# Patient Record
Sex: Female | Born: 1960
Health system: Southern US, Community
[De-identification: ages and names within clinical notes are randomized; demographics above are authoritative.]

## PROBLEM LIST (undated history)

## (undated) DIAGNOSIS — K8020 Calculus of gallbladder without cholecystitis without obstruction: Secondary | ICD-10-CM

## (undated) DIAGNOSIS — K429 Umbilical hernia without obstruction or gangrene: Secondary | ICD-10-CM

## (undated) DIAGNOSIS — K420 Umbilical hernia with obstruction, without gangrene: Secondary | ICD-10-CM

## (undated) DIAGNOSIS — K8010 Calculus of gallbladder with chronic cholecystitis without obstruction: Secondary | ICD-10-CM

## (undated) DIAGNOSIS — I10 Essential (primary) hypertension: Secondary | ICD-10-CM

## (undated) DIAGNOSIS — Z862 Personal history of diseases of the blood and blood-forming organs and certain disorders involving the immune mechanism: Secondary | ICD-10-CM

## (undated) DIAGNOSIS — G629 Polyneuropathy, unspecified: Secondary | ICD-10-CM

## (undated) DIAGNOSIS — E785 Hyperlipidemia, unspecified: Secondary | ICD-10-CM

## (undated) HISTORY — DX: Hyperlipidemia, unspecified: E78.5

## (undated) HISTORY — DX: Personal history of diseases of the blood and blood-forming organs and certain disorders involving the immune mechanism: Z86.2

## (undated) HISTORY — PX: OTHER SURGICAL HISTORY: SHX169

## (undated) HISTORY — DX: Polyneuropathy, unspecified: G62.9

---
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
## 2004-12-11 ENCOUNTER — Ambulatory Visit: Payer: Self-pay | Admitting: Internal Medicine

## 2004-12-13 ENCOUNTER — Ambulatory Visit: Payer: Self-pay | Admitting: Internal Medicine

## 2005-01-28 ENCOUNTER — Ambulatory Visit: Payer: Self-pay | Admitting: Internal Medicine

## 2005-02-13 ENCOUNTER — Encounter: Admission: RE | Admit: 2005-02-13 | Discharge: 2005-04-21 | Payer: Self-pay | Admitting: Internal Medicine

## 2007-06-10 ENCOUNTER — Encounter: Payer: Self-pay | Admitting: Internal Medicine

## 2008-06-02 ENCOUNTER — Ambulatory Visit: Payer: Self-pay | Admitting: Internal Medicine

## 2008-06-02 DIAGNOSIS — IMO0002 Reserved for concepts with insufficient information to code with codable children: Secondary | ICD-10-CM

## 2008-06-05 LAB — CONVERTED CEMR LAB
ALT: 15 units/L (ref 0–35)
Alkaline Phosphatase: 64 units/L (ref 39–117)
Bilirubin, Direct: 0.1 mg/dL (ref 0.0–0.3)
CO2: 29 meq/L (ref 19–32)
Cholesterol: 214 mg/dL (ref 0–200)
Glucose, Bld: 96 mg/dL (ref 70–99)
HDL: 43.5 mg/dL (ref >39.0)
Lymphocytes Relative: 25.1 % (ref 12.0–46.0)
Monocytes Relative: 6.4 % (ref 3.0–12.0)
Neutrophils Relative %: 65.7 % (ref 43.0–77.0)
Platelets: 260 10*3/uL (ref 150–400)
Potassium: 4.1 meq/L (ref 3.5–5.1)
RDW: 12.1 % (ref 11.5–14.6)
Sodium: 139 meq/L (ref 135–145)
TSH: 1.29 microintl units/mL (ref 0.35–5.50)
Total Bilirubin: 0.8 mg/dL (ref 0.3–1.2)
Total CHOL/HDL Ratio: 4.9
Total Protein: 6.8 g/dL (ref 6.0–8.3)
Triglycerides: 79 mg/dL (ref 0–149)
VLDL: 16 mg/dL (ref 0–40)

## 2008-06-06 ENCOUNTER — Telehealth (INDEPENDENT_AMBULATORY_CARE_PROVIDER_SITE_OTHER): Payer: Self-pay | Admitting: *Deleted

## 2008-06-07 ENCOUNTER — Encounter (INDEPENDENT_AMBULATORY_CARE_PROVIDER_SITE_OTHER): Payer: Self-pay | Admitting: *Deleted

## 2008-08-08 ENCOUNTER — Ambulatory Visit: Payer: Self-pay | Admitting: Internal Medicine

## 2008-08-14 LAB — CONVERTED CEMR LAB
ALT: 25 units/L (ref 0–35)
AST: 21 units/L (ref 0–37)
Albumin: 3.6 g/dL (ref 3.5–5.2)
Alkaline Phosphatase: 58 units/L (ref 39–117)
Bilirubin, Direct: 0 mg/dL (ref 0.0–0.3)
Cholesterol: 175 mg/dL (ref 0–200)
HDL: 45.8 mg/dL (ref >39.00)
LDL Cholesterol: 113 mg/dL — ABNORMAL HIGH (ref 0–99)
Total Bilirubin: 0.5 mg/dL (ref 0.3–1.2)
Total CHOL/HDL Ratio: 4
Total Protein: 6.6 g/dL (ref 6.0–8.3)
Triglycerides: 81 mg/dL (ref 0.0–149.0)
VLDL: 16.2 mg/dL (ref 0.0–40.0)

## 2008-08-15 ENCOUNTER — Encounter (INDEPENDENT_AMBULATORY_CARE_PROVIDER_SITE_OTHER): Payer: Self-pay | Admitting: *Deleted

## 2009-01-26 ENCOUNTER — Ambulatory Visit: Payer: Self-pay | Admitting: Vascular Surgery

## 2009-01-26 ENCOUNTER — Encounter: Payer: Self-pay | Admitting: Internal Medicine

## 2010-04-22 HISTORY — PX: SHOULDER SURGERY: SHX246

## 2010-09-04 NOTE — Consult Note (Signed)
VASCULAR SURGERY CONSULTATION   Erica Daugherty, Erica Daugherty  DOB:  1961/02/19                                       01/26/2009  GNFAO#:[id number]   I saw the patient in the office today in consultation concerning her  bilateral lower extremity swelling.  This is a pleasant 50 year old  woman who was referred by Dr. Rennis Chris.  She states that she has had  swelling in both legs for years and her swelling has typically been more  significant on the left side.  She has no history of previous DVT or  phlebitis.  She has had no history of abdominal surgery or surgery in  her groins.  She has had no previous radiation therapy.  The swelling  has been persistent for years and it is increased with prolonged  standing and ambulation.  She has just recently tried compression  stockings, which Dr. Rennis Chris prescribed, and she does feel that this has  helped some.   PAST MEDICAL HISTORY:  Fairly unremarkable.  She denies any history of  diabetes, hypertension, hypercholesterolemia, history of previous  myocardial infarction, history of congestive heart failure, or history  of COPD.   FAMILY HISTORY:  There is no history of premature cardiovascular  disease.   SOCIAL HISTORY:  She is married.  She has 4 children.  She works at  [employer] and spends most of her time on her feet.  She does not use tobacco.   REVIEW OF SYSTEMS AND MEDICATIONS:  Documented on the medical history  form on her chart.   PHYSICAL EXAMINATION:  This is a pleasant 50 year old woman who appears  her stated age.  She has moderate obesity.  Blood pressure is 124/87,  heart rate is 72.  HEENT is unremarkable.  Neck is supple.  I do not  detect any carotid bruits.  Lungs are clear bilaterally to auscultation.  On cardiac exam, she has a regular rate and rhythm.  Her abdomen is  obese and difficult to assess.  I cannot palpate an aneurysm.  She has  palpable femoral, popliteal, and pedal pulses  bilaterally.  She has  bilateral lower extremity swelling, slightly more significant on the  left side.  She does have some hyperpigmentation bilaterally, but again  more significantly on the left side.  She has no ischemic ulcers.  Neurologic exam shows good strength in her upper extremities and lower  extremities bilaterally.  Her edema in the lower extremities is mostly  nonpitting.   She did have a Doppler study in our office today, which showed no  evidence of DVT on either side, and no evidence of significant  incompetence in the deep system or great saphenous vein.   Based on her exam with hyperpigmentation bilaterally, I think she does  have some chronic venous insufficiency.  However, I think she also has  significant idiopathic lymphedema.  I have explained, however, that  fortunately the treatment for these is the same.  We have discussed the  importance of intermittent leg elevation and compression therapy.  We  have discussed the proper therapy for leg evaluation. and we have also  had a discussion about being sure that she is properly fitted for  compression stockings, and how best to wear these.  I think that by  following these recommendations, that her swelling will improve,  although this will  be a chronic problem.  I have reassured her that she  has no evidence of DVT and has excellent arterial flow.  I will be happy  to see her back at any time if any new vascular issues arise.   Di Kindle. Edilia Bo, M.D.  Electronically Signed  CSD/MEDQ  D:  01/26/2009  T:  01/26/2009  Job:  [id number]   cc:   Titus Dubin. Alwyn Ren, MD,FACP,FCCP  Vania Rea. Supple, M.D.

## 2010-09-04 NOTE — Procedures (Signed)
DUPLEX DEEP VENOUS EXAM - LOWER EXTREMITY   INDICATION:  Venous insufficiency, bilateral lower extremity swelling.   HISTORY:  Edema:  Chronic bilateral lower extremity swelling.  Trauma/Surgery:  No.  Pain:  No.  PE:  No.  Previous DVT:  No.  Anticoagulants:  No.  Other:   DUPLEX EXAM:                CFV   SFV   PopV  PTV    GSV                R  L  R  L  R  L  R   L  R  L  Thrombosis    o  o  o  o  o  o  o   o  o  o  Spontaneous   +  +  +  +  +  +  +   +  +  +  Phasic        +  +  +  +  +  +  +   +  +  +  Augmentation  +  +  +  +  +  +  +   +  +  +  Compressible  +  +  +  +  +  +  +   +  +  +  Competent     +  +  +  +  +  +  +   +  +  +   Legend:  + - yes  o - no  p - partial  D - decreased   IMPRESSION:  No evidence of deep venous thrombosis noted in the  bilateral lower extremities.    _____________________________  Di Kindle. Edilia Bo, M.D.   CH/MEDQ  D:  01/26/2009  T:  01/27/2009  Job:  [id number]

## 2011-02-01 ENCOUNTER — Encounter: Payer: Self-pay | Admitting: Internal Medicine

## 2011-04-12 ENCOUNTER — Encounter: Payer: Self-pay | Admitting: Internal Medicine

## 2011-05-27 ENCOUNTER — Encounter: Payer: Self-pay | Admitting: Internal Medicine

## 2011-07-23 ENCOUNTER — Ambulatory Visit (INDEPENDENT_AMBULATORY_CARE_PROVIDER_SITE_OTHER): Payer: 59 | Admitting: Internal Medicine

## 2011-07-23 VITALS — BP 130/90 | HR 80 | Temp 98.6°F | Resp 16 | Ht 66.5 in | Wt 240.2 lb

## 2011-07-23 DIAGNOSIS — J309 Allergic rhinitis, unspecified: Secondary | ICD-10-CM

## 2011-07-23 DIAGNOSIS — R05 Cough: Secondary | ICD-10-CM

## 2011-07-23 MED ORDER — PREDNISONE 20 MG PO TABS: ORAL_TABLET | ORAL | Status: DC

## 2011-07-23 MED ORDER — BENZONATATE 200 MG PO CAPS: 200.0000 mg | ORAL_CAPSULE | Freq: Three times a day (TID) | ORAL | Status: DC | PRN

## 2011-07-23 MED ORDER — ALBUTEROL SULFATE (2.5 MG/3ML) 0.083% IN NEBU
2.5000 mg | INHALATION_SOLUTION | Freq: Once | RESPIRATORY_TRACT | Status: AC
  Administered 2011-07-23: 2.5 mg via RESPIRATORY_TRACT

## 2011-07-23 NOTE — Progress Notes (Signed)
  Subjective:    Patient ID: Erica Daugherty, female    DOB: 1960/05/07, 51 y.o.   MRN: [id number]  Cough This is a new problem. The current episode started in the past 7 days. The problem has been unchanged. The problem occurs every few minutes. The cough is non-productive. Associated symptoms include nasal congestion and rhinorrhea. Pertinent negatives include no chest pain, fever, headaches, sore throat or wheezing. The symptoms are aggravated by nothing. She has tried OTC cough suppressant for the symptoms. Her past medical history is significant for environmental allergies. There is no history of asthma or bronchitis.  Erica Daugherty is here for a persistent dry cough that is unrelenting and not allowing her to sleep.  She has two co-workers that have been sick recently.  She wonders if it is her allergies causing this, as she has had no fever, no sore throat, but a tickle in the back of her throat and in her ear canals.  She is traveling to [place] tomorrow and wants to be checked out.    Review of Systems  Constitutional: Negative for fever.  HENT: Positive for rhinorrhea. Negative for sore throat.   Respiratory: Positive for cough. Negative for wheezing.   Cardiovascular: Negative for chest pain.  Neurological: Negative for headaches.  Hematological: Positive for environmental allergies.  All other systems reviewed and are negative.       Objective:   Physical Exam  Vitals reviewed. Constitutional: She is oriented to person, place, and time. She appears well-developed and well-nourished.  HENT:  Head: Normocephalic.  Mouth/Throat: Oropharynx is clear and moist. No oropharyngeal exudate.       Cerumen present in right IAC, left IAC with cerumen, TM appears normal  Neck: Neck supple.  Cardiovascular: Normal rate, regular rhythm and normal heart sounds.   Pulmonary/Chest: Effort normal and breath sounds normal. No respiratory distress. She has no wheezes.  Lymphadenopathy:    She has no  cervical adenopathy.  Neurological: She is alert and oriented to person, place, and time.  Skin: Skin is warm and dry.  Psychiatric: She has a normal mood and affect. Her behavior is normal.          Assessment & Plan:  RAD with allergic rhinits:  Not feeling much different after an albuterol nebulizer treatment.   Prednisone taper given.  Start Claritin 10 mg daily.  She is also given some Tessalon Perles to help her over the next few days with her travel as needed.  Discussed etiologies of allergies and given AVS print out.  Return if not much improved in a few days.

## 2011-07-23 NOTE — Patient Instructions (Signed)
Take your prednisone as directed and use the Tessalon Perles if needed.  Be sure and take a Claritin everyday for at least 2 weeks, then try going off for a time, if your symptoms return right away it is likely you have allergies.  Return to the clinic if your symptoms worsen. Allergies, Generic Allergies may happen from anything your body is sensitive to. This may be food, medicines, pollens, chemicals, and nearly anything around you in everyday life that produces allergens. An allergen is anything that causes an allergy producing substance. Heredity is often a factor in causing these problems. This means you may have some of the same allergies as your parents. Food allergies happen in all age groups. Food allergies are some of the most severe and life threatening. Some common food allergies are cow's milk, seafood, eggs, nuts, wheat, and soybeans. SYMPTOMS   Swelling around the mouth.   An itchy red rash or hives.   Vomiting or diarrhea.   Difficulty breathing.  SEVERE ALLERGIC REACTIONS ARE LIFE-THREATENING. This reaction is called anaphylaxis. It can cause the mouth and throat to swell and cause difficulty with breathing and swallowing. In severe reactions only a trace amount of food (for example, peanut oil in a salad) may cause death within seconds. Seasonal allergies occur in all age groups. These are seasonal because they usually occur during the same season every year. They may be a reaction to molds, grass pollens, or tree pollens. Other causes of problems are house dust mite allergens, pet dander, and mold spores. The symptoms often consist of nasal congestion, a runny itchy nose associated with sneezing, and tearing itchy eyes. There is often an associated itching of the mouth and ears. The problems happen when you come in contact with pollens and other allergens. Allergens are the particles in the air that the body reacts to with an allergic reaction. This causes you to release allergic  antibodies. Through a chain of events, these eventually cause you to release histamine into the blood stream. Although it is meant to be protective to the body, it is this release that causes your discomfort. This is why you were given anti-histamines to feel better. If you are unable to pinpoint the offending allergen, it may be determined by skin or blood testing. Allergies cannot be cured but can be controlled with medicine. Hay fever is a collection of all or some of the seasonal allergy problems. It may often be treated with simple over-the-counter medicine such as diphenhydramine. Take medicine as directed. Do not drink alcohol or drive while taking this medicine. Check with your caregiver or package insert for child dosages. If these medicines are not effective, there are many new medicines your caregiver can prescribe. Stronger medicine such as nasal spray, eye drops, and corticosteroids may be used if the first things you try do not work well. Other treatments such as immunotherapy or desensitizing injections can be used if all else fails. Follow up with your caregiver if problems continue. These seasonal allergies are usually not life threatening. They are generally more of a nuisance that can often be handled using medicine. HOME CARE INSTRUCTIONS   If unsure what causes a reaction, keep a diary of foods eaten and symptoms that follow. Avoid foods that cause reactions.   If hives or rash are present:   Take medicine as directed.   You may use an over-the-counter antihistamine (diphenhydramine) for hives and itching as needed.   Apply cold compresses (cloths) to the skin or  take baths in cool water. Avoid hot baths or showers. Heat will make a rash and itching worse.   If you are severely allergic:   Following a treatment for a severe reaction, hospitalization is often required for closer follow-up.   Wear a medic-alert bracelet or necklace stating the allergy.   You and your family  must learn how to give adrenaline or use an anaphylaxis kit.   If you have had a severe reaction, always carry your anaphylaxis kit or EpiPen with you. Use this medicine as directed by your caregiver if a severe reaction is occurring. Failure to do so could have a fatal outcome.  SEEK MEDICAL CARE IF:  You suspect a food allergy. Symptoms generally happen within 30 minutes of eating a food.   Your symptoms have not gone away within 2 days or are getting worse.   You develop new symptoms.   You want to retest yourself or your child with a food or drink you think causes an allergic reaction. Never do this if an anaphylactic reaction to that food or drink has happened before. Only do this under the care of a caregiver.  SEEK IMMEDIATE MEDICAL CARE IF:   You have difficulty breathing, are wheezing, or have a tight feeling in your chest or throat.   You have a swollen mouth, or you have hives, swelling, or itching all over your body.   You have had a severe reaction that has responded to your anaphylaxis kit or an EpiPen. These reactions may return when the medicine has worn off. These reactions should be considered life threatening.  MAKE SURE YOU:   Understand these instructions.   Will watch your condition.   Will get help right away if you are not doing well or get worse.  Document Released: 07/02/2002 Document Revised: 03/28/2011 Document Reviewed: 12/07/2007 [facility] Patient Information 2012 [place], [place].

## 2011-09-05 ENCOUNTER — Encounter: Payer: Self-pay | Admitting: Internal Medicine

## 2011-11-11 ENCOUNTER — Encounter: Payer: Self-pay | Admitting: Internal Medicine

## 2011-11-18 ENCOUNTER — Encounter: Payer: Self-pay | Admitting: Internal Medicine

## 2012-01-21 ENCOUNTER — Encounter: Payer: Self-pay | Admitting: Internal Medicine

## 2012-03-24 ENCOUNTER — Encounter: Payer: Self-pay | Admitting: Internal Medicine

## 2012-03-24 ENCOUNTER — Ambulatory Visit (INDEPENDENT_AMBULATORY_CARE_PROVIDER_SITE_OTHER): Payer: 59 | Admitting: Internal Medicine

## 2012-03-24 VITALS — BP 120/84 | HR 68 | Temp 98.0°F | Resp 12 | Ht 67.03 in | Wt 248.0 lb

## 2012-03-24 DIAGNOSIS — Z0000 Encounter for general adult medical examination without abnormal findings: Secondary | ICD-10-CM

## 2012-03-24 DIAGNOSIS — E785 Hyperlipidemia, unspecified: Secondary | ICD-10-CM

## 2012-03-24 NOTE — Progress Notes (Signed)
Subjective:    Patient ID: Erica Daugherty, female    DOB: 03-17-1961, 51 y.o.   MRN: [id number]  HPI  Erica Daugherty is here for a physical;acute issues include hormonal imbalance.     Review of Systems She was seeing Dr Lelon Huh in [place] to address hormonal imbalance. She is not had a gynecologic followup for at least 24 months. There is a family history of  testicular cancer in her brother had prostate cancer in her father.  She is on no specific nutritional program; she's not on a regular exercise program. When active she denies chest pain, palpitations or dyspnea. She does have some ankle edema. She denies claudication but does have some muscle cramps in her legs at times.  Advanced as far testing has revealed that her LDL goal was less than 100, ideally less than 70.     Objective:   Physical Exam Gen.:  well-nourished in appearance. Alert, appropriate and cooperative throughout exam. Head: Normocephalic without obvious abnormalities  Eyes: No corneal or conjunctival inflammation noted. Pupils equal round reactive to light and accommodation. Fundal exam is benign without hemorrhages, exudate, papilledema. Extraocular motion intact. Vision grossly normal with lenses. Ears: External  ear exam reveals no significant lesions or deformities. Canals clear .TMs normal. Hearing is grossly normal bilaterally. Nose: External nasal exam reveals no deformity or inflammation. Nasal mucosa are pink and moist. No lesions or exudates noted.   Mouth: Oral mucosa and oropharynx reveal no lesions or exudates. Teeth in good repair. Neck: No deformities, masses, or tenderness noted. Range of motion & Thyroid normal. Lungs: Normal respiratory effort; chest expands symmetrically. Lungs are clear to auscultation without rales, wheezes, or increased work of breathing. Heart: Normal rate and rhythm. Normal S1 and S2. No gallop, click, or rub. S4 w/o murmur. Abdomen: Bowel sounds normal; abdomen soft and nontender. No  masses, organomegaly or hernias noted. Genitalia: to establish with Gyn   .                                                                                   Musculoskeletal/extremities: No deformity or scoliosis noted of  the thoracic or lumbar spine. No clubbing, cyanosis, edema, or deformity noted. Range of motion  normal .Tone & strength  normal.Joints normal. Nail health  good. Vascular: Carotid, radial artery, dorsalis pedis and  posterior tibial pulses are full and equal. No bruits present. Neurologic: Alert and oriented x3. Deep tendon reflexes symmetrical and normal.          Skin: Intact without suspicious lesions or rashes. Lymph: No cervical, axillary lymphadenopathy present. Psych: Mood and affect are normal. Normally interactive                                                                                         Assessment & Plan:  #  1 comprehensive physical exam; no acute findings  Plan: see Orders

## 2012-03-24 NOTE — Patient Instructions (Addendum)
Preventive Health Care: Exercise  30-45  minutes a day, 3-4 days a week. Walking is especially valuable in preventing Osteoporosis. Eat a low-fat diet with lots of fruits and vegetables, up to 7-9 servings per day.  Consume less than 30 grams of sugar per day from foods & drinks with High Fructose Corn Syrup as #1,2,3 or #4 on label. Health Care Power of Attorney & Living Will place you in charge of your health care  decisions. Verify these are  in place. Please  schedule fasting Labs : BMET,Lipids, hepatic panel, CBC & dif, TSH. PLEASE BRING THESE INSTRUCTIONS TO FOLLOW UP  LAB APPOINTMENT.This will guarantee correct labs are drawn, eliminating need for repeat blood sampling ( needle sticks ! ). Diagnoses /Codes:V70.0. If you activate My Chart; the results can be released to you as soon as they populate from the lab. If you choose not to use this program; the labs have to be reviewed, copied & mailed   causing a delay in getting the results to you.  As per the Standard of Care , screening Colonoscopy recommended @ 50 & every 5-10 years thereafter . More frequent monitor would be dictated by family history or findings @ Colonoscopy

## 2012-03-26 ENCOUNTER — Other Ambulatory Visit (INDEPENDENT_AMBULATORY_CARE_PROVIDER_SITE_OTHER): Payer: 59

## 2012-03-26 DIAGNOSIS — Z0000 Encounter for general adult medical examination without abnormal findings: Secondary | ICD-10-CM

## 2012-03-26 LAB — CBC WITH DIFFERENTIAL/PLATELET
Basophils Absolute: 0 10*3/uL (ref 0.0–0.1)
Basophils Relative: 0.4 % (ref 0.0–3.0)
Eosinophils Relative: 3.1 % (ref 0.0–5.0)
HCT: 42.9 % (ref 36.0–46.0)
Hemoglobin: 14.1 g/dL (ref 12.0–15.0)
Lymphs Abs: 2.4 10*3/uL (ref 0.7–4.0)
Monocytes Relative: 7.9 % (ref 3.0–12.0)
Neutro Abs: 6 10*3/uL (ref 1.4–7.7)
RDW: 13.2 % (ref 11.5–14.6)

## 2012-03-26 LAB — HEPATIC FUNCTION PANEL
Albumin: 3.9 g/dL (ref 3.5–5.2)
Alkaline Phosphatase: 66 U/L (ref 39–117)

## 2012-03-26 LAB — LIPID PANEL
Cholesterol: 203 mg/dL — ABNORMAL HIGH (ref 0–200)
Total CHOL/HDL Ratio: 5
VLDL: 56.6 mg/dL — ABNORMAL HIGH (ref 0.0–40.0)

## 2012-03-26 LAB — BASIC METABOLIC PANEL
CO2: 25 mEq/L (ref 19–32)
Chloride: 103 mEq/L (ref 96–112)
Potassium: 3.9 mEq/L (ref 3.5–5.1)
Sodium: 136 mEq/L (ref 135–145)

## 2012-06-22 ENCOUNTER — Encounter: Payer: Self-pay | Admitting: Internal Medicine

## 2012-07-13 ENCOUNTER — Encounter: Payer: Self-pay | Admitting: Internal Medicine

## 2012-12-18 ENCOUNTER — Encounter: Payer: Self-pay | Admitting: Internal Medicine

## 2013-02-25 ENCOUNTER — Other Ambulatory Visit: Payer: Self-pay

## 2013-03-25 ENCOUNTER — Telehealth: Payer: Self-pay | Admitting: *Deleted

## 2013-03-25 NOTE — Telephone Encounter (Signed)
Left message for pt to return my call (Previsit Call)

## 2013-03-26 ENCOUNTER — Ambulatory Visit (INDEPENDENT_AMBULATORY_CARE_PROVIDER_SITE_OTHER): Payer: 59 | Admitting: Internal Medicine

## 2013-03-26 ENCOUNTER — Encounter: Payer: Self-pay | Admitting: Internal Medicine

## 2013-03-26 VITALS — BP 127/81 | HR 82 | Temp 98.4°F | Ht 67.5 in | Wt 247.6 lb

## 2013-03-26 DIAGNOSIS — E785 Hyperlipidemia, unspecified: Secondary | ICD-10-CM

## 2013-03-26 DIAGNOSIS — Z0000 Encounter for general adult medical examination without abnormal findings: Secondary | ICD-10-CM

## 2013-03-26 DIAGNOSIS — J209 Acute bronchitis, unspecified: Secondary | ICD-10-CM

## 2013-03-26 LAB — CBC WITH DIFFERENTIAL/PLATELET
Basophils Absolute: 0.1 10*3/uL (ref 0.0–0.1)
Basophils Relative: 0.6 % (ref 0.0–3.0)
Eosinophils Absolute: 0.3 10*3/uL (ref 0.0–0.7)
HCT: 42.3 % (ref 36.0–46.0)
Hemoglobin: 14.2 g/dL (ref 12.0–15.0)
Lymphs Abs: 2.2 10*3/uL (ref 0.7–4.0)
MCHC: 33.5 g/dL (ref 30.0–36.0)
MCV: 87.3 fl (ref 78.0–100.0)
Neutro Abs: 6.6 10*3/uL (ref 1.4–7.7)
RDW: 12.8 % (ref 11.5–14.6)

## 2013-03-26 LAB — BASIC METABOLIC PANEL
CO2: 31 mEq/L (ref 19–32)
Chloride: 100 mEq/L (ref 96–112)
Glucose, Bld: 91 mg/dL (ref 70–99)
Potassium: 4.1 mEq/L (ref 3.5–5.1)
Sodium: 137 mEq/L (ref 135–145)

## 2013-03-26 LAB — HEPATIC FUNCTION PANEL
AST: 19 U/L (ref 0–37)
Albumin: 4 g/dL (ref 3.5–5.2)
Total Protein: 7.4 g/dL (ref 6.0–8.3)

## 2013-03-26 LAB — TSH: TSH: 2.02 u[IU]/mL (ref 0.35–5.50)

## 2013-03-26 LAB — LIPID PANEL: Total CHOL/HDL Ratio: 5

## 2013-03-26 LAB — LDL CHOLESTEROL, DIRECT: Direct LDL: 157.2 mg/dL

## 2013-03-26 MED ORDER — HYDROCODONE-HOMATROPINE 5-1.5 MG/5ML PO SYRP: 5.0000 mL | ORAL_SOLUTION | Freq: Four times a day (QID) | ORAL | Status: DC | PRN

## 2013-03-26 MED ORDER — AZITHROMYCIN 250 MG PO TABS: ORAL_TABLET | ORAL | Status: DC

## 2013-03-26 NOTE — Progress Notes (Signed)
   Subjective:    Patient ID: Erica Daugherty, female    DOB: Sep 09, 1960, 52 y.o.   MRN: [id number]  HPI  She is here for a physical;acute issues include active RTI.  Over the last 5 days she's had cough with some yellow sputum associated with some shortness of breath. She's had some occipital headaches and itching in her ears. Additionally she's had sore throat. Mucinex has been of some benefit; over-the-counter cough meds have not. Cough has disturbed sleep. She specifically denies frontal headache, facial pain, nasal purulence, otic pain, or otic discharge.    Review of Systems A heart healthy diet is not followed; no exercise program. Family history is negative for premature coronary disease. Advanced cholesterol testing reveals  LDL goal is less than 100 ; ideally < 70 . No statin to date.  Low dose ASA not taken Specifically denied are  chest pain, palpitations, dyspnea, or claudication.  Some calf cramping @ night. Significant abdominal symptoms not present.     Objective:   Physical Exam Gen.:  well-nourished in appearance; weight excess. Alert, appropriate and cooperative throughout exam.   Head: Normocephalic without obvious abnormalities Eyes: No corneal or conjunctival inflammation noted. Pupils equal round reactive to light and accommodation. Extraocular motion intact. Ears: External  ear exam reveals no significant lesions or deformities. Canals clear .TMs normal.  Nose: External nasal exam reveals no deformity or inflammation. Nasal mucosa are pink and moist. No lesions or exudates noted.  Hyponasal speech pattern Oral:exam reveal no lesions or exudates. Teeth in good repair. Neck: No deformities, masses, or tenderness noted. Range of motion decreased. Thyroid normal. Lungs: Normal respiratory effort; chest expands symmetrically. Lungs are clear to auscultation without rales, wheezes, or increased work of breathing. Intermittent dry cough Heart: Normal rate and rhythm. Normal  S1 and S2. No gallop, click, or rub.S4 w/o murmur. Abdomen: Bowel sounds normal; abdomen soft and nontender. No masses, organomegaly or hernias noted. Genitalia: as per Gyn                                  Musculoskeletal/extremities: No deformity or scoliosis noted of  the thoracic or lumbar spine.  No clubbing, cyanosis, edema, or significant extremity  deformity noted. Range of motion normal .Tone & strength normal. Hand joints normal . Fingernail health good. Able to lie down & sit up w/o help. Negative SLR bilaterally Vascular: Carotid, radial artery, dorsalis pedis and  posterior tibial pulses are full and equal. No bruits present. Neurologic: Alert and oriented x3. Deep tendon reflexes symmetrical and 1/2 + @ knees.         Skin: Intact without suspicious lesions or rashes. Lymph: No cervical, axillary lymphadenopathy present. Psych: Mood and affect are normal. Normally interactive                                                                                        Assessment & Plan:  #1 comprehensive physical exam; no acute findings  #2 bronchitis, acute with upper respiratory tract congestion but no rhinosinusitis  Plan: see Orders  & Recommendations

## 2013-03-26 NOTE — Progress Notes (Signed)
Pre visit review using our clinic review tool, if applicable. No additional management support is needed unless otherwise documented below in the visit note. 

## 2013-03-26 NOTE — Patient Instructions (Addendum)
Plain Mucinex (NOT D) for thick secretions ;force NON dairy fluids .   Nasal cleansing in the shower as discussed with lather of mild shampoo.After 10 seconds wash off lather while  exhaling through nostrils. Make sure that all residual soap is removed to prevent irritation.  Nasacort AQ OTC 1 spray in each nostril twice a day as needed. Use the "crossover" technique into opposite nostril spraying toward opposite ear @ 45 degree angle, not straight up into nostril.  Use a Neti pot daily only  as needed for significant sinus congestion; going from open side to congested side . Plain Allegra (NOT D )  160 daily , Loratidine 10 mg , OR Zyrtec 10 mg @ bedtime  as needed for itchy eyes & sneezing.   Your next office appointment will be determined based upon review of your pending labs . Those instructions will be transmitted to you through My Chart. Followup as needed for your this acute issue. Please report any significant change in your symptoms. As per the Standard of Care , screening Colonoscopy recommended @ 50 & every 5-10 years thereafter . More frequent monitor would be dictated by family history or findings @ Colonoscopy

## 2013-07-23 ENCOUNTER — Ambulatory Visit: Payer: 59 | Admitting: Family Medicine

## 2013-07-23 VITALS — BP 138/88 | HR 94 | Temp 98.2°F | Resp 16 | Ht 68.0 in | Wt 252.0 lb

## 2013-07-23 DIAGNOSIS — J329 Chronic sinusitis, unspecified: Secondary | ICD-10-CM

## 2013-07-23 MED ORDER — AMOXICILLIN 875 MG PO TABS: 875.0000 mg | ORAL_TABLET | Freq: Two times a day (BID) | ORAL | Status: DC

## 2013-07-23 NOTE — Progress Notes (Signed)
    Patient ID: Erica Daugherty MRN: [id number], DOB: November 20, 1960, 53 y.o. Date of Encounter: 07/23/2013, 2:01 PM  Primary Physician: Unice Cobble, MD  Chief Complaint:  Chief Complaint  Patient presents with  . head congestion    sneezing, ears stopped up    HPI: 53 y.o. year old female presents with 5 day history of nasal congestion, post nasal drip, sore throat, sinus pressure, and cough. Afebrile. No chills. Nasal congestion thick and green/yellow. Sinus pressure is the worst symptom. Cough is productive secondary to post nasal drip and not associated with time of day. Ears feel full, leading to sensation of muffled hearing. Has tried OTC cold preps without success. No GI complaints.   No recent antibiotics, recent travels, or sick contacts   No leg trauma, sedentary periods, h/o cancer, or tobacco use.  Past Medical History  Diagnosis Date  . Hyperlipidemia   . History of decreased platelet count      Home Meds: Prior to Admission medications   Medication Sig Start Date End Date Taking? Authorizing Provider  RESTASIS 0.05 % ophthalmic emulsion Place 1 drop into both eyes 2 (two) times daily. 03/02/13   Historical Provider, MD    Allergies: No Known Allergies  History   Social History  . Marital Status: Married    Spouse Name: N/A    Number of Children: N/A  . Years of Education: N/A   Occupational History  . Not on file.   Social History Main Topics  . Smoking status: Never Smoker   . Smokeless tobacco: Not on file  . Alcohol Use: Yes     Comment: very rarely  . Drug Use: Not on file  . Sexual Activity: Not on file   Other Topics Concern  . Not on file   Social History Narrative  . No narrative on file     Review of Systems: Constitutional: negative for chills, fever, night sweats or weight changes Cardiovascular: negative for chest pain or palpitations Respiratory: negative for hemoptysis, wheezing, or shortness of breath Abdominal: negative for  abdominal pain, nausea, vomiting or diarrhea Dermatological: negative for rash Neurologic: negative for headache   Physical Exam: Blood pressure 138/88, pulse 94, temperature 98.2 F (36.8 C), temperature source Oral, resp. rate 16, height 5\' 8"  (1.727 m), weight 252 lb (114.306 kg), SpO2 100.00%., Body mass index is 38.33 kg/(m^2). General: Well developed, well nourished, in no acute distress. Head: Normocephalic, atraumatic, eyes without discharge, sclera non-icteric, nares are congested. Bilateral auditory canals clear, TM's are without perforation, pearly grey with reflective cone of light bilaterally. Serous effusion bilaterally behind TM's. Maxillary sinus TTP. Oral cavity moist, dentition normal. Posterior pharynx with post nasal drip and mild erythema. No peritonsillar abscess or tonsillar exudate. Neck: Supple. No thyromegaly. Full ROM. No lymphadenopathy. Lungs: Clear bilaterally to auscultation without wheezes, rales, or rhonchi. Breathing is unlabored.  Heart: RRR with S1 S2. No murmurs, rubs, or gallops appreciated. Msk:  Strength and tone normal for age. Extremities: No clubbing or cyanosis. No edema. Neuro: Alert and oriented X 3. Moves all extremities spontaneously. CNII-XII grossly in tact. Psych:  Responds to questions appropriately with a normal affect.    ASSESSMENT AND PLAN:  53 y.o. year old female with sinusitis Sinusitis - Plan: amoxicillin (AMOXIL) 875 MG tablet   -  -Tylenol/Motrin prn -Rest/fluids -RTC precautions -RTC 3-5 days if no improvement  Signed, Robyn Haber, MD 07/23/2013 2:01 PM

## 2013-07-23 NOTE — Patient Instructions (Signed)

## 2013-08-30 ENCOUNTER — Ambulatory Visit: Payer: 59 | Admitting: Family Medicine

## 2013-08-30 VITALS — BP 134/82 | HR 80 | Temp 97.7°F | Resp 16 | Ht 66.0 in | Wt 253.2 lb

## 2013-08-30 DIAGNOSIS — J309 Allergic rhinitis, unspecified: Secondary | ICD-10-CM

## 2013-08-30 NOTE — Patient Instructions (Signed)
Long acting antihistamine like zyrtec, allegra or claritin (generic fine) Decongestant sudafed (generic fine), plain, not long acting- take in the morning, and after lunch For runny nose- Afrin for 3 days For allergies can start flonase- 2 sprays in each nostril once a day  Allergic Rhinitis Allergic rhinitis is when the mucous membranes in the nose respond to allergens. Allergens are particles in the air that cause your body to have an allergic reaction. This causes you to release allergic antibodies. Through a chain of events, these eventually cause you to release histamine into the blood stream. Although meant to protect the body, it is this release of histamine that causes your discomfort, such as frequent sneezing, congestion, and an itchy, runny nose.  CAUSES  Seasonal allergic rhinitis (hay fever) is caused by pollen allergens that may come from grasses, trees, and weeds. Year-round allergic rhinitis (perennial allergic rhinitis) is caused by allergens such as house dust mites, pet dander, and mold spores.  SYMPTOMS   Nasal stuffiness (congestion).  Itchy, runny nose with sneezing and tearing of the eyes. DIAGNOSIS  Your health care provider can help you determine the allergen or allergens that trigger your symptoms. If you and your health care provider are unable to determine the allergen, skin or blood testing may be used. TREATMENT  Allergic Rhinitis does not have a cure, but it can be controlled by:  Medicines and allergy shots (immunotherapy).  Avoiding the allergen. Hay fever may often be treated with antihistamines in pill or nasal spray forms. Antihistamines block the effects of histamine. There are over-the-counter medicines that may help with nasal congestion and swelling around the eyes. Check with your health care provider before taking or giving this medicine.  If avoiding the allergen or the medicine prescribed do not work, there are many new medicines your health care  provider can prescribe. Stronger medicine may be used if initial measures are ineffective. Desensitizing injections can be used if medicine and avoidance does not work. Desensitization is when a patient is given ongoing shots until the body becomes less sensitive to the allergen. Make sure you follow up with your health care provider if problems continue. HOME CARE INSTRUCTIONS It is not possible to completely avoid allergens, but you can reduce your symptoms by taking steps to limit your exposure to them. It helps to know exactly what you are allergic to so that you can avoid your specific triggers. SEEK MEDICAL CARE IF:   You have a fever.  You develop a cough that does not stop easily (persistent).  You have shortness of breath.  You start wheezing.  Symptoms interfere with normal daily activities. Document Released: 01/01/2001 Document Revised: 01/27/2013 Document Reviewed: 12/14/2012 [facility] Patient Information 2014 [place].

## 2013-08-30 NOTE — Progress Notes (Signed)
   Subjective:    Patient ID: Valeta Harms, female    DOB: 10-14-1960, 53 y.o.   MRN: [id number]  HPI Patient presents today with 4 days of sneezing, facial pressure, eye pain, copious watery nasal drainage.   No OTC medications.  Patient was seen for same symptoms approximately 1 month ago. Was treated with Amoxil with full resolution of symptoms.    Review of Systems No sore throat, no ear pain, no fever, no cough, no shortness of breath.    Objective:   Physical Exam  Vitals reviewed. Constitutional: She is oriented to person, place, and time. She appears well-developed and well-nourished. No distress.  HENT:  Head: Normocephalic and atraumatic.  Right Ear: Tympanic membrane, external ear and ear canal normal.  Left Ear: Tympanic membrane, external ear and ear canal normal.  Nose: Mucosal edema and rhinorrhea present. Right sinus exhibits no maxillary sinus tenderness and no frontal sinus tenderness. Left sinus exhibits no maxillary sinus tenderness and no frontal sinus tenderness.  Mouth/Throat: Uvula is midline, oropharynx is clear and moist and mucous membranes are normal.  Eyes: Conjunctivae are normal. Right eye exhibits no discharge. Left eye exhibits no discharge.  Neck: Normal range of motion. Neck supple.  Cardiovascular: Normal rate, regular rhythm and normal heart sounds.   Pulmonary/Chest: Effort normal and breath sounds normal.  Musculoskeletal: Normal range of motion.  Lymphadenopathy:    She has no cervical adenopathy.  Neurological: She is alert and oriented to person, place, and time.  Skin: Skin is warm and dry. She is not diaphoretic.  Psychiatric: She has a normal mood and affect. Her behavior is normal. Judgment and thought content normal.      Assessment & Plan:  1. Allergic rhinitis -provided written and verbal instructions regarding diagnosis, including the following OTC medication management- Long acting antihistamine like zyrtec, allegra or  claritin (generic fine) Decongestant sudafed (generic fine), plain, not long acting- take in the morning, and after lunch For runny nose- Afrin for 3 days For allergies can start flonase- 2 sprays in each nostril once a day  -RTC if no improvement in 3-5 days  Elby Beck, FNP-BC  [facility]  08/30/2013 8:22 AM

## 2013-09-02 ENCOUNTER — Encounter: Payer: Self-pay | Admitting: Internal Medicine

## 2013-09-10 ENCOUNTER — Encounter: Payer: Self-pay | Admitting: Podiatrist

## 2013-09-10 ENCOUNTER — Ambulatory Visit (INDEPENDENT_AMBULATORY_CARE_PROVIDER_SITE_OTHER): Payer: 59

## 2013-09-10 ENCOUNTER — Telehealth: Payer: Self-pay | Admitting: *Deleted

## 2013-09-10 ENCOUNTER — Ambulatory Visit (INDEPENDENT_AMBULATORY_CARE_PROVIDER_SITE_OTHER): Payer: 59 | Admitting: Podiatrist

## 2013-09-10 VITALS — BP 122/86 | HR 84 | Resp 12

## 2013-09-10 DIAGNOSIS — R52 Pain, unspecified: Secondary | ICD-10-CM

## 2013-09-10 DIAGNOSIS — G589 Mononeuropathy, unspecified: Secondary | ICD-10-CM

## 2013-09-10 DIAGNOSIS — G629 Polyneuropathy, unspecified: Secondary | ICD-10-CM

## 2013-09-10 NOTE — Telephone Encounter (Signed)
Referral to Dr. Neomia Dear for Neuropathy   Dr. Neomia Dear [address] [place], [place] [address] Ph: [phone]

## 2013-09-10 NOTE — Progress Notes (Signed)
Subjective:    Patient ID: Erica Daugherty, female    DOB: 1960-11-05, 53 y.o.   MRN: [id number]  HPI PT STATED BOTH FOOT ARE SWOLLEN, BURNING, AND PAINFUL FOR 6 MONTHS. THE FEET BEEN THE SAME NOT WORSE. THE FEET GET AGGRAVATED BY SITTING/WALKING. TRIED NO TREATMENT.   Review of Systems  All other systems reviewed and are negative.      Objective:   Physical Exam Patient is awake, alert, and oriented x 3.  In no acute distress.  Vascular status is intact with palpable pedal pulses at 2/4 DP and PT bilateral and capillary refill time within normal limits.  Dermatological exam reveals skin color, turger and texture as normal. No open lesions present.  Low-grade swelling is noted bilateral nonpitting in nature. Musculature intact with dorsiflexion, plantarflexion, inversion, eversion.  Neurological sensation is decreased at the fifth digit and sub-met 5 bilaterally to [employer] monofilament. The remainder of the [employer] [employer] monofilament and exam is normal at 8/10 sites. Light touch is intact, vibratory sensation is absent bilateral. Achilles tendon reflex not elicited. Negative Tinel sign is elicited with percussion along the tarsal canal. Subjectively she feels that the plantar foot and metatarsal fat pad feels "tight" and feels like it tingles and burns. This has been worsening over the past 6 months   X-rays are normal bilateral. Very slight inferior calcaneal spur is present bilateral. No acute osseous abnormalities are present otherwise.     Assessment & Plan:  Early neuropathy, swelling  Plan: I am starting her on a neuropathic pain medication. I recommend a referral for further evaluation to figure out what the neuropathy is from. She denies any back injury or pain. No sign of tarsal tunnel syndrome noted

## 2013-09-10 NOTE — Telephone Encounter (Signed)
Neuropathic Cream Compound prescription sent to [facility] Trigeminal Neuralgia- Phantom Limb Pain- Developing Neuropathy- RSD/CRPS Clonidine 0.2%, Gabapentin 6%, Imipramine 3%, Ketamine 10%, Lidocaine 2% and Mefenamic Acid 1% 180 gm tube, 2 refills, Diagnosis: Neuropathy   [address] [place], [place] [address] [phone]

## 2013-09-10 NOTE — Patient Instructions (Addendum)
You have been prescribed a topical pain medications through a specialty compounding pharmacy called [facility] out of [place], [place]. A representative from the pharmacy will call to ensure you would like to receive the medication. If you do not hear from them within two business days please call to confirm they have received the prescription. Their telephone number is [phone].    If you have any other questions or concerns regarding the medication please call our office.   Peripheral Neuropathy Peripheral neuropathy is a type of nerve damage. It affects nerves that carry signals between the spinal cord and other parts of the body. These are called peripheral nerves. With peripheral neuropathy, one nerve or a group of nerves may be damaged.  CAUSES  Many things can damage peripheral nerves. For some people with peripheral neuropathy, the cause is unknown. Some causes include:  Diabetes. This is the most common cause of peripheral neuropathy.  Injury to a nerve.  Pressure or stress on a nerve that lasts a long time.  Too little vitamin B. Alcoholism can lead to this.  Infections.  Autoimmune diseases, such as multiple sclerosis and systemic lupus erythematosus.  Inherited nerve diseases.  Some medicines, such as cancer drugs.  Toxic substances, such as lead and mercury.  Too little blood flowing to the legs.  Kidney disease.  Thyroid disease. SIGNS AND SYMPTOMS  Different people have different symptoms. The symptoms you have will depend on which of your nerves is damaged. Common symptoms include:  Loss of feeling (numbness) in the feet and hands.  Tingling in the feet and hands.  Pain that burns.  Very sensitive skin.  Weakness.  Not being able to move a part of the body (paralysis).  Muscle twitching.  Clumsiness or poor coordination.  Loss of balance.  Not being able to control your bladder.  Feeling dizzy.  Sexual problems. DIAGNOSIS   Peripheral neuropathy is a symptom, not a disease. Finding the cause of peripheral neuropathy can be hard. To figure that out, your health care provider will take a medical history and do a physical exam. A neurological exam will also be done. This involves checking things affected by your brain, spinal cord, and nerves (nervous system). For example, your health care provider will check your reflexes, how you move, and what you can feel.  Other types of tests may also be ordered, such as:  Blood tests.  A test of the fluid in your spinal cord.  Imaging tests, such as CT scans or an MRI.  Electromyography (EMG). This test checks the nerves that control muscles.  Nerve conduction velocity tests. These tests check how fast messages pass through your nerves.  Nerve biopsy. A small piece of nerve is removed. It is then checked under a microscope. TREATMENT   Medicine is often used to treat peripheral neuropathy. Medicines may include:  Pain-relieving medicines. Prescription or over-the-counter medicine may be suggested.  Antiseizure medicine. This may be used for pain.  Antidepressants. These also may help ease pain from neuropathy.  Lidocaine. This is a numbing medicine. You might wear a patch or be given a shot.  Mexiletine. This medicine is typically used to help control irregular heart rhythms.  Surgery. Surgery may be needed to relieve pressure on a nerve or to destroy a nerve that is causing pain.  Physical therapy to help movement.  Assistive devices to help movement. HOME CARE INSTRUCTIONS   Only take over-the-counter or prescription medicines as directed by your health care provider.  Follow the instructions carefully for any given medicines. Do not take any other medicines without first getting approval from your health care provider.  If you have diabetes, work closely with your health care provider to keep your blood sugar under control.  If you have numbness in your  feet:  Check every day for signs of injury or infection. Watch for redness, warmth, and swelling.  Wear padded socks and comfortable shoes. These help protect your feet.  Do not do things that put pressure on your damaged nerve.  Do not smoke. Smoking keeps blood from getting to damaged nerves.  Avoid or limit alcohol. Too much alcohol can cause a lack of B vitamins. These vitamins are needed for healthy nerves.  Develop a good support system. Coping with peripheral neuropathy can be stressful. Talk to a mental health specialist or join a support group if you are struggling.  Follow up with your health care provider as directed. SEEK MEDICAL CARE IF:   You have new signs or symptoms of peripheral neuropathy.  You are struggling emotionally from dealing with peripheral neuropathy.  You have a fever. SEEK IMMEDIATE MEDICAL CARE IF:   You have an injury or infection that is not healing.  You feel very dizzy or begin vomiting.  You have chest pain.  You have trouble breathing. Document Released: 03/29/2002 Document Revised: 12/19/2010 Document Reviewed: 12/14/2012 [facility] Patient Information 2014 [place].

## 2013-09-29 ENCOUNTER — Encounter: Payer: 59 | Admitting: Gynecology

## 2013-09-30 ENCOUNTER — Ambulatory Visit (INDEPENDENT_AMBULATORY_CARE_PROVIDER_SITE_OTHER): Payer: 59 | Admitting: Nurse Practitioner

## 2013-09-30 ENCOUNTER — Encounter: Payer: Self-pay | Admitting: Nurse Practitioner

## 2013-09-30 VITALS — BP 118/76 | HR 76 | Ht 67.0 in | Wt 250.0 lb

## 2013-09-30 DIAGNOSIS — Z01419 Encounter for gynecological examination (general) (routine) without abnormal findings: Secondary | ICD-10-CM

## 2013-09-30 DIAGNOSIS — Z0000 Encounter for general adult medical examination without abnormal findings: Secondary | ICD-10-CM

## 2013-09-30 DIAGNOSIS — Z79890 Hormone replacement therapy (postmenopausal): Secondary | ICD-10-CM

## 2013-09-30 MED ORDER — ESTRADIOL 0.5 MG PO TABS: 0.5000 mg | ORAL_TABLET | Freq: Every day | ORAL | Status: DC

## 2013-09-30 MED ORDER — MEDROXYPROGESTERONE ACETATE 2.5 MG PO TABS: 2.5000 mg | ORAL_TABLET | Freq: Every day | ORAL | Status: DC

## 2013-09-30 NOTE — Progress Notes (Signed)
Patient ID: Erica Daugherty, female   DOB: Aug 18, 1960, 53 y.o.   MRN: [id number] 53 y.o. [id number] Married Caucasian Fe here for annual exam.  Went through menopause at age 22.  Having an increase in vaso day and night, insomnia, brain fog, some vaginal dryness, decrease libido, ad mood changes. Took bio-identical HRT in 2012 and did well.    Patient's last menstrual period was 04/22/2009.          Sexually active: yes  The current method of family planning is post menopausal status.    Exercising: no  The patient does not participate in regular exercise at present. Smoker:  no  Health Maintenance: Pap:  03/26/10, normal per patient, no history of abnormal pap MMG:  08/10/13, 3D, negative TDaP:  UTD Labs: 03/2013, PCP   reports that she has never smoked. She has never used smokeless tobacco. She reports that she drinks alcohol. She reports that she does not use illicit drugs.  Past Medical History  Diagnosis Date  . Hyperlipidemia   . History of decreased platelet count     Past Surgical History  Procedure Laterality Date  . Shoulder surgery  2012    Left ; Dr Onnie Graham  . Eye surgery  1964    lazy eye  . No colonoscopy      03/26/13 SOC reviewed    Current Outpatient Prescriptions  Medication Sig Dispense Refill  . NONFORMULARY OR COMPOUNDED ITEM Compounded formula for nerve issues in feet      . RESTASIS 0.05 % ophthalmic emulsion Place 1 drop into both eyes 2 (two) times daily.      Marland Kitchen estradiol (ESTRACE) 0.5 MG tablet Take 1 tablet (0.5 mg total) by mouth daily.  90 tablet  0  . medroxyPROGESTERone (PROVERA) 2.5 MG tablet Take 1 tablet (2.5 mg total) by mouth daily.  90 tablet  0   No current facility-administered medications for this visit.    Family History  Problem Relation Age of Onset  . Diabetes Mother   . Hyperlipidemia Mother   . Hypertension Mother   . Thyroid nodules Mother   . Testicular cancer Brother   . Diabetes Brother   . Stroke Neg Hx   . Heart disease Neg  Hx   . Prostate cancer Father     ROS:  Pertinent items are noted in HPI.  Otherwise, a comprehensive ROS was negative.  Exam:   BP 118/76  Pulse 76  Ht 5\' 7"  (1.702 m)  Wt 250 lb (113.399 kg)  BMI 39.15 kg/m2  LMP 04/22/2009 Height: 5\' 7"  (170.2 cm)  Ht Readings from Last 3 Encounters:  09/30/13 5\' 7"  (1.702 m)  08/30/13 5\' 6"  (1.676 m)  07/23/13 5\' 8"  (1.727 m)    General appearance: alert, cooperative and appears stated age Head: Normocephalic, without obvious abnormality, atraumatic Neck: no adenopathy, supple, symmetrical, trachea midline and thyroid normal to inspection and palpation Lungs: clear to auscultation bilaterally Breasts: normal appearance, no masses or tenderness Heart: regular rate and rhythm Abdomen: soft, non-tender; no masses,  no organomegaly Extremities: extremities normal, atraumatic, no cyanosis or edema, there is a mole on left lower leg that she will have her dermatologist to look at. Skin: Skin color, texture, turgor normal. No rashes or lesions Lymph nodes: Cervical, supraclavicular, and axillary nodes normal. No abnormal inguinal nodes palpated Neurologic: Grossly normal   Pelvic: External genitalia:  no lesions              Urethra:  normal appearing urethra with no masses, tenderness or lesions              Bartholin's and Skene's: normal                 Vagina: normal appearing vagina with normal color and discharge, no lesions              Cervix: anteverted              Pap taken: yes Bimanual Exam:  Uterus:  normal size, contour, position, consistency, mobility, non-tender              Adnexa: no mass, fullness, tenderness               Rectovaginal: Confirms               Anus:  normal sphincter tone, no lesions  A:  Well Woman with normal exam  Postmenopausal off bio identical HRT since late 2012  Wants to restart HRT due to significant symptoms.  Atrophic vaginitis  Mole LLL that maybe has changed per patient   P:   Reviewed  health and wellness pertinent to exam  Pap smear taken today  Mammogram is due 07/2014  Rx for Estradiol 0.5 mg daily for 3 months  Rx for Provera 2.5 mg daily for 3 months  She will  Return in 3 months for a consult visit  Discussion  about WHI study with +/- risk and benefits.  Counseled with risk of DVT, CVA, cancer, etc  She will consult with dermatology about mole changes  Counseled on breast self exam, mammography screening, use and side effects of HRT, menopause, adequate intake of calcium and vitamin D, diet and exercise, Kegel's exercises return annually or prn  An After Visit Summary was printed and given to the patient.

## 2013-09-30 NOTE — Patient Instructions (Signed)

## 2013-10-01 LAB — IPS PAP TEST WITH HPV

## 2013-10-06 ENCOUNTER — Ambulatory Visit (INDEPENDENT_AMBULATORY_CARE_PROVIDER_SITE_OTHER): Payer: 59 | Admitting: Neurology

## 2013-10-06 ENCOUNTER — Ambulatory Visit (INDEPENDENT_AMBULATORY_CARE_PROVIDER_SITE_OTHER): Payer: Self-pay

## 2013-10-06 DIAGNOSIS — G63 Polyneuropathy in diseases classified elsewhere: Secondary | ICD-10-CM

## 2013-10-06 DIAGNOSIS — G609 Hereditary and idiopathic neuropathy, unspecified: Secondary | ICD-10-CM

## 2013-10-06 DIAGNOSIS — Z0289 Encounter for other administrative examinations: Secondary | ICD-10-CM

## 2013-10-06 DIAGNOSIS — G629 Polyneuropathy, unspecified: Secondary | ICD-10-CM | POA: Insufficient documentation

## 2013-10-06 NOTE — Procedures (Signed)
   NCS (NERVE CONDUCTION STUDY) WITH EMG (ELECTROMYOGRAPHY) REPORT   STUDY DATE: June 17th 3500 PATIENT NAME: Erica Daugherty DOB: 9/38/1830 MRN: [id number]    TECHNOLOGIST: Laretta Alstrom ELECTROMYOGRAPHER: Marcial Pacas M.D.  CLINICAL INFORMATION:  53 years old female, presenting with six-month history of bilateral feet paresthesia, mainly involving bilateral toes, and plantar surface, midline low back pain, shooting pain to her posterior thigh   On examination, obesity, mild length dependent sensory changes to pinprick, vibratory sensation, absent ankle reflexes  FINDINGS: NERVE CONDUCTION STUDY: Bilateral peroneal sensory responses were absent. Bilateral peroneal motor response showed mildly decreased C. map amplitude, with normal distal latency, F waves latency, conduction velocity.  Bilateral tibial motor responses showed severely decreased C. map amplitude, was normal distal latency, F wave latency, conduction velocity. Bilateral tibial reflexes were absent.  Right ulnar sensory and motor responses were normal. Right median sensory response showed mildly prolonged peak latency, preserved snap amplitude, right median motor response showed mildly prolonged distal latency, with normal C. map amplitude, conduction velocity, F wave latency was within normal limit  NEEDLE ELECTROMYOGRAPHY: Selected needle examination was performed at right lower extremity muscles, right lumbosacral paraspinal muscles  Needle examination of right tibialis anterior, tibialis posterior, peroneal longus, vastus lateralis, gluteus medius was normal  Right abductor hallucis longus: Increased insertion activity, 1 plus positive activities, mildly enlarged motor unit potential, with mildly decreased recruitment patterns  There is no spontaneous activity at right lumbosacral paraspinal muscles, right L4-5 S1  IMPRESSION:  This is an abnormal study. There is electrodiagnostic evidence of length dependent mild,  axonal, sensory motor polyneuropathy. Common etiology of diabetes, thyroid dysfunction, B12 deficiency, paraproteinemia, idiopathic peripheral neuropathy. There was no evidence of right lumbar radiculopathy.   INTERPRETING PHYSICIAN:   Marcial Pacas M.D. Ph.D. [facility] [address] [place], [place] [address] [phone]

## 2013-10-07 NOTE — Progress Notes (Signed)
Encounter reviewed by Dr. Brook Silva.  

## 2013-10-12 ENCOUNTER — Encounter: Payer: Self-pay | Admitting: Neurology

## 2013-10-12 ENCOUNTER — Ambulatory Visit (INDEPENDENT_AMBULATORY_CARE_PROVIDER_SITE_OTHER): Payer: 59 | Admitting: Neurology

## 2013-10-12 ENCOUNTER — Telehealth: Payer: Self-pay | Admitting: Nurse Practitioner

## 2013-10-12 VITALS — BP 128/81 | HR 80 | Ht 67.0 in | Wt 250.0 lb

## 2013-10-12 DIAGNOSIS — G629 Polyneuropathy, unspecified: Secondary | ICD-10-CM

## 2013-10-12 DIAGNOSIS — G609 Hereditary and idiopathic neuropathy, unspecified: Secondary | ICD-10-CM

## 2013-10-12 DIAGNOSIS — E785 Hyperlipidemia, unspecified: Secondary | ICD-10-CM

## 2013-10-12 MED ORDER — GABAPENTIN 100 MG PO CAPS: 100.0000 mg | ORAL_CAPSULE | Freq: Three times a day (TID) | ORAL | Status: DC

## 2013-10-12 NOTE — Progress Notes (Signed)
PATIENT: Erica Daugherty DOB: [birth year]  HISTORICAL  Erica Daugherty is a 53 years old right-handed Caucasian female, referred by her primary care physician Dr. Unice Cobble, and pain management Dr. 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  She complains of 6 months history of bilateral feet paresthesia, at plantar surface, pad, numbness, burning sensation, needle prick,  She denies weakness, getting sore after weight bearing. She does have low back pain, going down her legs.  She denies gait difficulty, no fingers paresthesia, no incontinence,  She has brought the laboratory in December 2014, which has demonstrates normal CMP, CBC, TSH, LDL was elevated 152  REVIEW OF SYSTEMS: Full 14 system review of systems performed and notable only for bilateral lower extremity paresthesia, numbness  ALLERGIES: No Known Allergies  HOME MEDICATIONS: Current Outpatient Prescriptions on File Prior to Visit  Medication Sig Dispense Refill  . estradiol (ESTRACE) 0.5 MG tablet Take 1 tablet (0.5 mg total) by mouth daily.  90 tablet  0  . medroxyPROGESTERone (PROVERA) 2.5 MG tablet Take 1 tablet (2.5 mg total) by mouth daily.  90 tablet  0  . NONFORMULARY OR COMPOUNDED ITEM Compounded formula for nerve issues in feet      . RESTASIS 0.05 % ophthalmic emulsion Place 1 drop into both eyes 2 (two) times daily.         PAST MEDICAL HISTORY: Past Medical History  Diagnosis Date  . Hyperlipidemia   . History of decreased platelet count     PAST SURGICAL HISTORY: Past Surgical History  Procedure Laterality Date  . Shoulder surgery  2012    Left ; Dr Onnie Graham  . Eye surgery  1964    lazy eye  . No colonoscopy      03/26/13 SOC reviewed    FAMILY HISTORY: Family History   Problem Relation Age of Onset  . Diabetes Mother   . Hyperlipidemia Mother   . Hypertension Mother   . Thyroid nodules Mother   . Testicular cancer Brother   . Diabetes Brother   . Stroke Neg Hx   . Heart disease Neg Hx   . Prostate cancer Father     SOCIAL HISTORY:  History   Social History  . Marital Status: Married    Spouse Name: [name]    Number of Children: 4  . Years of Education: college   Occupational History  . PROFESSIONAL PHOTOGRAPHER    Social History Main Topics  . Smoking status: Never Smoker   . Smokeless tobacco: Never Used  . Alcohol Use: 0.6 oz/week    1 Glasses of wine per week     Comment: very rarely  . Drug Use: No  . Sexual Activity: Yes    Partners: Male    Birth Control/ Protection: Post-menopausal, Surgical     Comment: vasectomy   Other Topics Concern  . Not on file   Social History Narrative   Patient lives at home with her husband [name]). Patient works full time.   Education college   Right handed.   Caffeine sometimes one cup of sweet tea.   PHYSICAL EXAM   Filed Vitals:   10/12/13 1030  BP: 128/81  Pulse: 80  Height: 5\' 7"  (1.702 m)  Weight: 250 lb (113.399 kg)  Not recorded    Body mass index is 39.15 kg/(m^2).   Generalized: In no acute distress  Neck: Supple, no carotid bruits   Cardiac: Regular rate rhythm  Pulmonary: Clear to auscultation bilaterally  Musculoskeletal: No deformity  Neurological examination  Mentation: Alert oriented to time, place, history taking, and causual conversation  Cranial nerve II-XII: Pupils were equal round reactive to light. Extraocular movements were full.  Visual field were full on confrontational test. Bilateral fundi were sharp.  Facial sensation and strength were normal. Hearing was intact to finger rubbing bilaterally. Uvula tongue midline.  Head turning and shoulder shrug and were normal and symmetric.Tongue protrusion into cheek strength was normal.  Motor: Normal  tone, bulk and strength.  Sensory: Length dependent Intact to fine touch, pinprick, at midshin level, absent vibratory sensation to ankle level  Coordination: Normal finger to nose, heel-to-shin bilaterally there was no truncal ataxia  Gait: Rising up from seated position without assistance, normal stance, without trunk ataxia, moderate stride, good arm swing, smooth turning, able to perform tiptoe, and heel walking without difficulty.   Romberg signs: Negative  Deep tendon reflexes: Brachioradialis 2/2, biceps 2/2, triceps 2/2, patellar 2/2, Achilles trace, plantar responses were flexor bilaterally.   DIAGNOSTIC DATA (LABS, IMAGING, TESTING) - I reviewed patient records, labs, notes, testing and imaging myself where available.  Lab Results  Component Value Date   WBC 10.1 03/26/2013   HGB 14.2 03/26/2013   HCT 42.3 03/26/2013   MCV 87.3 03/26/2013   PLT 208.0 03/26/2013      Component Value Date/Time   NA 137 03/26/2013 1038   K 4.1 03/26/2013 1038   CL 100 03/26/2013 1038   CO2 31 03/26/2013 1038   GLUCOSE 91 03/26/2013 1038   BUN 11 03/26/2013 1038   CREATININE 1.0 03/26/2013 1038   CALCIUM 9.5 03/26/2013 1038   PROT 7.4 03/26/2013 1038   ALBUMIN 4.0 03/26/2013 1038   AST 19 03/26/2013 1038   ALT 24 03/26/2013 1038   ALKPHOS 67 03/26/2013 1038   BILITOT 0.6 03/26/2013 1038   GFRNONAA 63 06/02/2008 1031   GFRAA 76 06/02/2008 1031   Lab Results  Component Value Date   CHOL 215* 03/26/2013   HDL 42.60 03/26/2013   LDLCALC 113* 08/08/2008   LDLDIRECT 157.2 03/26/2013   TRIG 138.0 03/26/2013   CHOLHDL 5 03/26/2013   Lab Results  Component Value Date   HGBA1C 5.5 06/02/2008   No results found for this basename: [id number]   Lab Results  Component Value Date   TSH 2.02 03/26/2013      ASSESSMENT AND PLAN  Erica Daugherty is a 53 y.o. female complains of  6 months history of bilateral feet paresthesia, consistent with axonal peripheral neuropathy  1, laboratory evaluation for  etiology, I will call her report 2 Gabapentin 100 mg 3 times a day for symptomatic control  3. Return to clinic with Hoyle Sauer in 6 months   Marcial Pacas, M.D. Ph.D.  [facility] [address], [place], [place] [address] [phone]

## 2013-10-12 NOTE — Telephone Encounter (Signed)
Patient said there was come confusing about patients insurance when she first came in on 09/30/13 so she was a self pay on that day pt said the insurance is not fixed and is going to send [place] the new information tomorrow by fax. She wants up to go back and file the insurance for that visit so that she can get reimbursed. Just sending to you that we we wont forget to file the claim on insurance

## 2013-10-14 LAB — CK: CK TOTAL: 81 U/L (ref 24–173)

## 2013-10-14 LAB — ANA W/REFLEX IF POSITIVE: Anti Nuclear Antibody(ANA): NEGATIVE

## 2013-10-14 LAB — PROTEIN ELECTROPHORESIS
A/G Ratio: 1.4 (ref 0.7–2.0)
ALPHA 1: 0.2 g/dL (ref 0.1–0.4)
Albumin ELP: 3.8 g/dL (ref 3.2–5.6)
Alpha 2: 0.6 g/dL (ref 0.4–1.2)
Beta: 1.1 g/dL (ref 0.6–1.3)
Gamma Globulin: 0.9 g/dL (ref 0.5–1.6)
Globulin, Total: 2.8 g/dL (ref 2.0–4.5)
Total Protein: 6.6 g/dL (ref 6.0–8.5)

## 2013-10-14 LAB — RPR: SYPHILIS RPR SCR: NONREACTIVE

## 2013-10-14 LAB — C-REACTIVE PROTEIN: CRP: 5.4 mg/L — ABNORMAL HIGH (ref 0.0–4.9)

## 2013-10-14 LAB — FOLATE: FOLATE: 7.8 ng/mL (ref >3.0)

## 2013-10-14 LAB — VITAMIN B12: VITAMIN B 12: 496 pg/mL (ref 211–946)

## 2013-10-14 LAB — HGB A1C W/O EAG: Hgb A1c MFr Bld: 5.7 % — ABNORMAL HIGH (ref 4.8–5.6)

## 2013-10-15 NOTE — Progress Notes (Signed)
Quick Note:  Please call patient, lab showed mild CRP,of unknown clinical significance, elevated A1c 5.7, high normal, diet control, exercise ______

## 2013-10-15 NOTE — Progress Notes (Signed)
Quick Note:  Spoke to patient and relayed lab results, per Dr. Krista Blue. She would like to know what the next step is and what is causing the neuropathy. ______

## 2013-10-28 ENCOUNTER — Encounter: Payer: Self-pay | Admitting: Neurology

## 2013-10-28 ENCOUNTER — Ambulatory Visit (INDEPENDENT_AMBULATORY_CARE_PROVIDER_SITE_OTHER): Payer: 59 | Admitting: Neurology

## 2013-10-28 VITALS — Ht 67.0 in | Wt 252.0 lb

## 2013-10-28 DIAGNOSIS — G629 Polyneuropathy, unspecified: Secondary | ICD-10-CM

## 2013-10-28 DIAGNOSIS — E785 Hyperlipidemia, unspecified: Secondary | ICD-10-CM

## 2013-10-28 DIAGNOSIS — G609 Hereditary and idiopathic neuropathy, unspecified: Secondary | ICD-10-CM

## 2013-10-28 NOTE — Progress Notes (Signed)
PATIENT: Erica Daugherty DOB: [birth year]  HISTORICAL  Erica Daugherty is a 53 years old right-handed Caucasian female, referred by her primary care physician Dr. Unice Cobble, and pain management Dr. 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  She complains of 6 months history of bilateral feet paresthesia, at plantar surface, pad, numbness, burning sensation, needle prick,  She denies weakness, getting sore after weight bearing. She does have low back pain, going down her legs.  She denies gait difficulty, no fingers paresthesia, no incontinence,  She has brought the laboratory in December 2014, which has demonstrates normal CMP, CBC, TSH, LDL was elevated 152  UPDATE July 9th 2015:  She is taking Gabapentin 100mg  bid, which does help her symptoms, Labs showed normal X73, folic acid, RPR, mild elevated A1c 5.7, normal electrophoresis.  Previous electrodiagnostic study dose demonstrate mild axonal peripheral neuropathy, she complains bilateral feet swelling, ankle achiness pain, difficulty walking because of ankle discomfort  REVIEW OF SYSTEMS: Full 14 system review of systems performed and notable only for bilateral lower extremity paresthesia, numbness  ALLERGIES: No Known Allergies  HOME MEDICATIONS: Current Outpatient Prescriptions on File Prior to Visit  Medication Sig Dispense Refill  . estradiol (ESTRACE) 0.5 MG tablet Take 1 tablet (0.5 mg total) by mouth daily.  90 tablet  0  . medroxyPROGESTERone (PROVERA) 2.5 MG tablet Take 1 tablet (2.5 mg total) by mouth daily.  90 tablet  0  . NONFORMULARY OR COMPOUNDED ITEM Compounded formula for nerve issues in feet      . RESTASIS 0.05 % ophthalmic emulsion Place 1 drop into both eyes 2 (two) times daily.          PAST MEDICAL HISTORY: Past Medical History  Diagnosis Date  . Hyperlipidemia   . History of decreased platelet count     PAST SURGICAL HISTORY: Past Surgical History  Procedure Laterality Date  . Shoulder surgery  2012    Left ; Dr Onnie Graham  . Eye surgery  1964    lazy eye  . No colonoscopy      03/26/13 SOC reviewed    FAMILY HISTORY: Family History  Problem Relation Age of Onset  . Diabetes Mother   . Hyperlipidemia Mother   . Hypertension Mother   . Thyroid nodules Mother   . Testicular cancer Brother   . Diabetes Brother   . Stroke Neg Hx   . Heart disease Neg Hx   . Prostate cancer Father     SOCIAL HISTORY:  History   Social History  . Marital Status: Married    Spouse Name: [name]    Number of Children: 4  . Years of Education: college   Occupational History  . PROFESSIONAL PHOTOGRAPHER    Social History Main Topics  . Smoking status: Never Smoker   . Smokeless tobacco: Never Used  . Alcohol Use: 0.6 oz/week    1 Glasses of wine per week     Comment: very rarely  . Drug Use: No  . Sexual Activity: Yes    Partners: Male    Birth Control/ Protection: Post-menopausal, Surgical     Comment: vasectomy   Other Topics Concern  . Not on file   Social History Narrative   Patient lives at home with her husband [name]).  Patient works full time.   Education college   Right handed.   Caffeine sometimes one cup of sweet tea.   PHYSICAL EXAM   Filed Vitals:   10/28/13 1541  Height: 5\' 7"  (1.702 m)  Weight: 252 lb (114.306 kg)    Not recorded    Body mass index is 39.46 kg/(m^2).   Generalized: In no acute distress  Neck: Supple, no carotid bruits   Cardiac: Regular rate rhythm  Pulmonary: Clear to auscultation bilaterally  Musculoskeletal: No deformity  Neurological examination  Mentation: Alert oriented to time, place, history taking, and causual conversation  Cranial nerve II-XII: Pupils were equal round reactive to light.  Extraocular movements were full.  Visual field were full on confrontational test. Bilateral fundi were sharp.  Facial sensation and strength were normal. Hearing was intact to finger rubbing bilaterally. Uvula tongue midline.  Head turning and shoulder shrug and were normal and symmetric.Tongue protrusion into cheek strength was normal.  Motor: Normal tone, bulk and strength.  Sensory: Length dependent Intact to fine touch, pinprick, at midshin level, absent vibratory sensation to ankle level  Coordination: Normal finger to nose, heel-to-shin bilaterally there was no truncal ataxia  Gait: Rising up from seated position without assistance, normal stance, without trunk ataxia, moderate stride, good arm swing, smooth turning, able to perform tiptoe, and heel walking without difficulty.   Romberg signs: Negative  Deep tendon reflexes: Brachioradialis 2/2, biceps 2/2, triceps 2/2, patellar 2/2, Achilles trace, plantar responses were flexor bilaterally.   DIAGNOSTIC DATA (LABS, IMAGING, TESTING) - I reviewed patient records, labs, notes, testing and imaging myself where available.  Lab Results  Component Value Date   WBC 10.1 03/26/2013   HGB 14.2 03/26/2013   HCT 42.3 03/26/2013   MCV 87.3 03/26/2013   PLT 208.0 03/26/2013      Component Value Date/Time   NA 137 03/26/2013 1038   K 4.1 03/26/2013 1038   CL 100 03/26/2013 1038   CO2 31 03/26/2013 1038   GLUCOSE 91 03/26/2013 1038   BUN 11 03/26/2013 1038   CREATININE 1.0 03/26/2013 1038   CALCIUM 9.5 03/26/2013 1038   PROT 6.6 10/12/2013 1126   PROT 7.4 03/26/2013 1038   ALBUMIN 4.0 03/26/2013 1038   AST 19 03/26/2013 1038   ALT 24 03/26/2013 1038   ALKPHOS 67 03/26/2013 1038   BILITOT 0.6 03/26/2013 1038   GFRNONAA 63 06/02/2008 1031   GFRAA 76 06/02/2008 1031   Lab Results  Component Value Date   CHOL 215* 03/26/2013   HDL 42.60 03/26/2013   LDLCALC 113* 08/08/2008   LDLDIRECT 157.2 03/26/2013   TRIG 138.0 03/26/2013   CHOLHDL 5 03/26/2013    Lab Results  Component Value Date   HGBA1C 5.7* 10/12/2013   Lab Results  Component Value Date   VITAMINB12 496 10/12/2013   Lab Results  Component Value Date   TSH 2.02 03/26/2013      ASSESSMENT AND PLAN  Erica Daugherty is a 53 y.o. female complains of  6 months history of bilateral feet paresthesia, consistent with mild axonal peripheral neuropathy, probably due to her mild abnormal glucose,  I have suggested her continue with diet, exercise, weight loss, Return to clinic one more time with Hoyle Sauer in December 2015, but there is no significant changes, she may return to clinic as needed   Marcial Pacas, M.D. Ph.D.  [facility] [address] [place], [place] [address] [phone]

## 2013-11-02 ENCOUNTER — Telehealth: Payer: Self-pay | Admitting: Neurology

## 2013-11-02 NOTE — Telephone Encounter (Signed)
I left message for patient concerning her questions, she should keep follow up appt, call for worsening symptoms may see her earlier

## 2013-11-03 NOTE — Telephone Encounter (Signed)
Okay to close

## 2014-01-03 ENCOUNTER — Encounter: Payer: Self-pay | Admitting: Nurse Practitioner

## 2014-01-03 ENCOUNTER — Ambulatory Visit (INDEPENDENT_AMBULATORY_CARE_PROVIDER_SITE_OTHER): Payer: 59 | Admitting: Nurse Practitioner

## 2014-01-03 VITALS — BP 122/66 | HR 72 | Ht 67.0 in | Wt 249.0 lb

## 2014-01-03 DIAGNOSIS — Z79890 Hormone replacement therapy (postmenopausal): Secondary | ICD-10-CM

## 2014-01-03 MED ORDER — ESTRADIOL 1 MG PO TABS: 1.0000 mg | ORAL_TABLET | Freq: Every day | ORAL | Status: DC

## 2014-01-03 MED ORDER — MEDROXYPROGESTERONE ACETATE 5 MG PO TABS: 5.0000 mg | ORAL_TABLET | Freq: Every day | ORAL | Status: DC

## 2014-01-03 NOTE — Progress Notes (Signed)
Patient ID: Erica Daugherty, female   DOB: April 11, 1961, 53 y.o.   MRN: [id number] S: 53 y.o. [id number] Married Caucasian Fe here for consult on HRT.   She has been on HRT now for 3 months.  She went through menopause at age 80. Took bio-identical HRT in 2012 for about a year and did well. She then came in for AEX in June with an increase in vaso symptoms day and night, insomnia, brain fog, some vaginal dryness, decrease libido, ad mood changes. We discussed risks and benefits and she decide for quality of life issues to proceed with restarting HRT.  Mammogram is current and normal. She was given Rx for Estradiol 0.5 mg daily and  Provera 2.5 mg daily for 3 months.  She return now to discuss her symptoms.  She feels better overall with less vaso symptoms. Denies vaginal bleeding or spotting.  Some symptoms are better.  Less vaso symptoms, Sleep slight better.  Brain fog is still there - maybe slight better.  Improved vaginal dryness. There has been some other stressors with the passing of her father in law.  Assessment:  Postmenopausal on HRT 09/2012 this time   Emotional stressors with passing of father in law  Plan:  Discussion again about +/- risk and benefits.  I do feel that she may do a little better with an increase of Estradiol to 1 mg and Provera 5 mg daily.  She will try this for 3 months and call back.  If symptoms about the same will go back down to 0.5 mg Estradiol and Provera 2.5 mg.  She is to be aware of any signs of DVT.  Consult time:  15 minutes face to face

## 2014-01-03 NOTE — Patient Instructions (Signed)

## 2014-01-11 NOTE — Progress Notes (Signed)
Encounter reviewed by Dr. Brook Silva.  

## 2014-02-21 ENCOUNTER — Encounter: Payer: Self-pay | Admitting: Nurse Practitioner

## 2014-03-24 ENCOUNTER — Ambulatory Visit (INDEPENDENT_AMBULATORY_CARE_PROVIDER_SITE_OTHER): Payer: 59 | Admitting: Family

## 2014-03-24 ENCOUNTER — Encounter: Payer: Self-pay | Admitting: Family

## 2014-03-24 VITALS — BP 138/84 | HR 95 | Temp 99.4°F | Resp 18 | Ht 67.5 in | Wt 253.0 lb

## 2014-03-24 DIAGNOSIS — J019 Acute sinusitis, unspecified: Secondary | ICD-10-CM

## 2014-03-24 MED ORDER — HYDROCODONE-HOMATROPINE 5-1.5 MG/5ML PO SYRP: 5.0000 mL | ORAL_SOLUTION | Freq: Three times a day (TID) | ORAL | Status: DC | PRN

## 2014-03-24 MED ORDER — AMOXICILLIN-POT CLAVULANATE 875-125 MG PO TABS: 1.0000 | ORAL_TABLET | Freq: Two times a day (BID) | ORAL | Status: DC

## 2014-03-24 NOTE — Progress Notes (Signed)
   Subjective:    Patient ID: Erica Daugherty, female    DOB: 05-Mar-1961, 53 y.o.   MRN: [id number]  Chief Complaint  Patient presents with  . Cough    congestion, sinus pressure, chest tightness, headache, x4 days    HPI:  Erica Daugherty is a 53 y.o. female who presents today for an acute visit.   Acute symptoms of congestion, sinus pressure, chest tightness, and headache started about 4 days ago. Started off as allergies and now has progressed to coughing which causes her chest to hurt. Has felt feverish. Denies any nausea, diarrhea, vomiting, or body aches. Has tried allergy medication (Allegra) which did not seem to help.     No Known Allergies  Current Outpatient Prescriptions on File Prior to Visit  Medication Sig Dispense Refill  . estradiol (ESTRACE) 1 MG tablet Take 1 tablet (1 mg total) by mouth daily. 90 tablet 0  . gabapentin (NEURONTIN) 100 MG capsule Take 1 capsule (100 mg total) by mouth 3 (three) times daily. 90 capsule 12  . medroxyPROGESTERone (PROVERA) 5 MG tablet Take 1 tablet (5 mg total) by mouth daily. 90 tablet 0   No current facility-administered medications on file prior to visit.                                  Review of Systems    See HPI  Objective:    BP 138/84 mmHg  Pulse 95  Temp(Src) 99.4 F (37.4 C) (Oral)  Resp 18  Ht 5' 7.5" (1.715 m)  Wt 253 lb (114.76 kg)  BMI 39.02 kg/m2  SpO2 98%  LMP 04/22/2009 Nursing note and vital signs reviewed.  Physical Exam  Constitutional: She is oriented to person, place, and time. She appears well-developed and well-nourished. No distress.  HENT:  Right Ear: Hearing, tympanic membrane, external ear and ear canal normal.  Left Ear: Hearing, tympanic membrane, external ear and ear canal normal.  Nose: Right sinus exhibits maxillary sinus tenderness. Right sinus exhibits no frontal sinus tenderness. Left sinus exhibits maxillary sinus tenderness. Left sinus exhibits no frontal sinus tenderness.   Mouth/Throat: Uvula is midline and mucous membranes are normal. Posterior oropharyngeal erythema present.  Cardiovascular: Normal rate, regular rhythm, normal heart sounds and intact distal pulses.   Pulmonary/Chest: Effort normal and breath sounds normal.  Neurological: She is alert and oriented to person, place, and time.  Skin: Skin is warm and dry.  Psychiatric: She has a normal mood and affect. Her behavior is normal. Judgment and thought content normal.       Assessment & Plan:

## 2014-03-24 NOTE — Progress Notes (Signed)
Pre visit review using our clinic review tool, if applicable. No additional management support is needed unless otherwise documented below in the visit note. 

## 2014-03-24 NOTE — Patient Instructions (Signed)
Thank you for choosing [facility].  Summary/Instructions:  Your prescription(s) have been submitted to your pharmacy. Please take as directed and contact our office if you believe you are having problem(s) with the medication(s).  If your symptoms worsen or fail to improve, please contact our office for further instruction, or in case of emergency go directly to the emergency room at the closest medical facility.   General Recommendations: Please drink plenty of fluids. Sleep in humidified air if possible. Use saline nasal sprays or the Netti pot as needed.   Medicaitons:  Flonase as needed to assist with congestion. You may use Mucinex to help break up congestion as you feel is needed. Tylenol as needed for fever. Sudafed for congestion.     Sinusitis Sinusitis is redness, soreness, and inflammation of the paranasal sinuses. Paranasal sinuses are air pockets within the bones of your face (beneath the eyes, the middle of the forehead, or above the eyes). In healthy paranasal sinuses, mucus is able to drain out, and air is able to circulate through them by way of your nose. However, when your paranasal sinuses are inflamed, mucus and air can become trapped. This can allow bacteria and other germs to grow and cause infection. Sinusitis can develop quickly and last only a short time (acute) or continue over a long period (chronic). Sinusitis that lasts for more than 12 weeks is considered chronic.  CAUSES  Causes of sinusitis include:  Allergies.  Structural abnormalities, such as displacement of the cartilage that separates your nostrils (deviated septum), which can decrease the air flow through your nose and sinuses and affect sinus drainage.  Functional abnormalities, such as when the small hairs (cilia) that line your sinuses and help remove mucus do not work properly or are not present. SIGNS AND SYMPTOMS  Symptoms of acute and chronic sinusitis are the same. The primary symptoms  are pain and pressure around the affected sinuses. Other symptoms include:  Upper toothache.  Earache.  Headache.  Bad breath.  Decreased sense of smell and taste.  A cough, which worsens when you are lying flat.  Fatigue.  Fever.  Thick drainage from your nose, which often is green and may contain pus (purulent).  Swelling and warmth over the affected sinuses. DIAGNOSIS  Your health care provider will perform a physical exam. During the exam, your health care provider may:  Look in your nose for signs of abnormal growths in your nostrils (nasal polyps).  Tap over the affected sinus to check for signs of infection.  View the inside of your sinuses (endoscopy) using an imaging device that has a light attached (endoscope). If your health care provider suspects that you have chronic sinusitis, one or more of the following tests may be recommended:  Allergy tests.  Nasal culture. A sample of mucus is taken from your nose, sent to a lab, and screened for bacteria.  Nasal cytology. A sample of mucus is taken from your nose and examined by your health care provider to determine if your sinusitis is related to an allergy. TREATMENT  Most cases of acute sinusitis are related to a viral infection and will resolve on their own within 10 days. Sometimes medicines are prescribed to help relieve symptoms (pain medicine, decongestants, nasal steroid sprays, or saline sprays).  However, for sinusitis related to a bacterial infection, your health care provider will prescribe antibiotic medicines. These are medicines that will help kill the bacteria causing the infection.  Rarely, sinusitis is caused by a fungal   infection. In theses cases, your health care provider will prescribe antifungal medicine. For some cases of chronic sinusitis, surgery is needed. Generally, these are cases in which sinusitis recurs more than 3 times per year, despite other treatments. HOME CARE INSTRUCTIONS   Drink  plenty of water. Water helps thin the mucus so your sinuses can drain more easily.  Use a humidifier.  Inhale steam 3 to 4 times a day (for example, sit in the bathroom with the shower running).  Apply a warm, moist washcloth to your face 3 to 4 times a day, or as directed by your health care provider.  Use saline nasal sprays to help moisten and clean your sinuses.  Take medicines only as directed by your health care provider.  If you were prescribed either an antibiotic or antifungal medicine, finish it all even if you start to feel better. SEEK IMMEDIATE MEDICAL CARE IF:  You have increasing pain or severe headaches.  You have nausea, vomiting, or drowsiness.  You have swelling around your face.  You have vision problems.  You have a stiff neck.  You have difficulty breathing. MAKE SURE YOU:   Understand these instructions.  Will watch your condition.  Will get help right away if you are not doing well or get worse. Document Released: 04/08/2005 Document Revised: 08/23/2013 Document Reviewed: 04/23/2011 ExitCare Patient Information 2015 [employer]. This information is not intended to replace advice given to you by your health care provider. Make sure you discuss any questions you have with your health care provider.  

## 2014-03-24 NOTE — Assessment & Plan Note (Signed)
Symptoms and exam consistent with bacterial sinusitis. Start Augmentin. Start Hycodan as needed for cough and sleep. Discussed sinus care. Continue over-the-counter medications as needed for symptom relief. Follow up if symptoms worsen or fail to improve.

## 2014-04-11 ENCOUNTER — Encounter: Payer: Self-pay | Admitting: Nurse Practitioner

## 2014-04-11 ENCOUNTER — Ambulatory Visit (INDEPENDENT_AMBULATORY_CARE_PROVIDER_SITE_OTHER): Payer: 59 | Admitting: Nurse Practitioner

## 2014-04-11 ENCOUNTER — Other Ambulatory Visit: Payer: Self-pay | Admitting: Nurse Practitioner

## 2014-04-11 VITALS — BP 135/84 | HR 78 | Temp 99.1°F | Resp 16 | Ht 67.0 in | Wt 251.8 lb

## 2014-04-11 DIAGNOSIS — G629 Polyneuropathy, unspecified: Secondary | ICD-10-CM

## 2014-04-11 MED ORDER — GABAPENTIN 100 MG PO CAPS: 300.0000 mg | ORAL_CAPSULE | Freq: Every day | ORAL | Status: DC

## 2014-04-11 NOTE — Telephone Encounter (Signed)
Medication refill request: Estrace 1 mg tablet Last AEX: 09/30/13 Next AEX: 10/04/14 Last MMG (if hormonal medication request): 08/10/13 [id number]:neg Refill authorized: Last 01/03/14 #90/0refills.  Patient to try increase of Estradiol to 1 mg and Provera 5 mg daily. She will try this for 3 months and call back. OV 01/03/14.   Please advise.

## 2014-04-11 NOTE — Progress Notes (Signed)
[facility]  PATIENT: Erica Daugherty DOB: [id number]   REASON FOR VISIT: follow up for neuropathy    HISTORY OF PRESENT ILLNESS: Erica Daugherty is a 53 years old right-handed Caucasian female, referred by her primary care physician Dr. Unice Cobble, and pain management Dr. 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 She complains of 6 months history of bilateral feet paresthesia, at plantar surface, pad, numbness, burning sensation, needle prick, She denies weakness, getting sore after weight bearing. She does have low back pain, going down her legs. She denies gait difficulty, no fingers paresthesia, no incontinence, She has brought the laboratory in December 2014, which has demonstrates normal CMP, CBC, TSH, LDL was elevated 152 UPDATE July 9th 2015:She is taking Gabapentin 100mg  bid, which does help her symptoms, Labs showed normal I45, folic acid, RPR, mild elevated A1c 5.7, normal electrophoresis.Previous electrodiagnostic study dose demonstrate mild axonal peripheral neuropathy, she complains bilateral feet swelling, ankle achiness pain, difficulty walking because of ankle discomfort UPDATE: April 11, 2014: Erica Daugherty, 53 year old female returns for follow-up. She was last seen by Dr. Krista Blue 10/28/2013. She has a history of mild axonal peripheral neuropathy, she is unable to tolerate gabapentin during the day due to drowsiness. She gets no regular exercise. She has not had any falls.. She continues to have some numbness of the feet. She returns for reevaluation. She needs refills on her medications  REVIEW OF SYSTEMS: Full 14 system review of systems performed and notable only for those listed, all others are neg:  Constitutional: N/A  Cardiovascular:  N/A  Ear/Nose/Throat: Ringing in the ears Skin: N/A  Eyes: N/A  Respiratory: N/A  Gastroitestinal: N/A  Hematology/Lymphatic: N/A  Endocrine: N/A Musculoskeletal:N/A  Allergy/Immunology: N/A  Neurological: N/A Psychiatric: N/A Sleep : Snoring ALLERGIES: No Known Allergies  HOME MEDICATIONS: Outpatient Prescriptions Prior to Visit  Medication Sig Dispense Refill  . estradiol (ESTRACE) 1 MG tablet Take 1 tablet (1 mg total) by mouth daily. 90 tablet 0  . gabapentin (NEURONTIN) 100 MG capsule Take 1 capsule (100 mg total) by mouth 3 (three) times daily. (Patient taking differently: Take 100 mg by mouth 3 (three) times daily. Takes 1 at night, but tries to get two tabs a day) 90 capsule 12  . medroxyPROGESTERone (PROVERA) 5 MG tablet Take 1 tablet (5 mg total) by mouth daily. 90 tablet 0  . amoxicillin-clavulanate (AUGMENTIN) 875-125 MG per tablet Take 1 tablet by mouth 2 (two) times daily. 20 tablet 0  . HYDROcodone-homatropine (HYCODAN) 5-1.5 MG/5ML syrup Take 5 mLs by mouth every 8 (eight) hours as needed for cough. 120 mL 0   No facility-administered medications prior to visit.    PAST MEDICAL HISTORY: Past Medical History  Diagnosis Date  . Hyperlipidemia   . History of decreased platelet count     PAST SURGICAL HISTORY: Past Surgical History  Procedure Laterality Date  . Shoulder surgery  2012    Left ; Dr Onnie Graham  . Eye surgery  1964    lazy eye  . No colonoscopy      03/26/13 SOC reviewed    FAMILY HISTORY: Family History  Problem Relation Age of Onset  . Diabetes Mother   . Hyperlipidemia Mother   . Hypertension Mother   . Thyroid nodules Mother   .  Testicular cancer Brother   . Diabetes Brother   . Stroke Neg Hx   . Heart disease Neg Hx   . Prostate cancer Father     SOCIAL HISTORY: History   Social History  . Marital Status: Married    Spouse Name: [name]    Number of Children: 4  . Years of Education: college   Occupational History  .  PROFESSIONAL PHOTOGRAPHER    Social History Main Topics  . Smoking status: Never Smoker   . Smokeless tobacco: Never Used  . Alcohol Use: 0.6 oz/week    1 Glasses of wine per week     Comment: very rarely  . Drug Use: No  . Sexual Activity:    Partners: Male    Birth Control/ Protection: Post-menopausal, Surgical     Comment: vasectomy   Other Topics Concern  . Not on file   Social History Narrative   Patient lives at home with her husband [name]). Patient works full time.   Education college   Right handed.   Caffeine sometimes one cup of sweet tea.     PHYSICAL EXAM  Filed Vitals:   04/11/14 0822 04/11/14 0828  BP: 139/90 135/84  Pulse: 81 78  Temp: 99.1 F (37.3 C)   TempSrc: Oral   Resp: 16   Height: 5\' 7"  (1.702 m)   Weight: 251 lb 12.8 oz (114.216 kg)    Body mass index is 39.43 kg/(m^2). Generalized: In no acute distress, obese female Neck: Supple, no carotid bruits  Cardiac: Regular rate rhythm Pulmonary: Clear to auscultation bilaterally Musculoskeletal: No deformity  Neurological examination Mentation: Alert oriented to time, place, history taking, and causual conversation Cranial nerve II-XII: Pupils were equal round reactive to light. Extraocular movements were full. Visual field were full on confrontational test. Facial sensation and strength were normal. Hearing was intact to finger rubbing bilaterally. Uvula tongue midline. Head turning and shoulder shrug and were normal and symmetric.Tongue protrusion into cheek strength was normal. Motor: Normal tone, bulk and strength. No focal weakness Sensory: Length dependent Intact to fine touch, pinprick, at midshin level, absent vibratory sensation to ankle level Coordination: Normal finger to nose, heel-to-shin bilaterally there was no truncal ataxia Gait: Rising up from seated position without assistance, normal stance, without trunk ataxia, moderate stride, good arm swing, smooth turning, able to  perform tiptoe, and heel walking without difficulty. Tandem gait is normal Deep tendon reflexes: Brachioradialis 2/2, biceps 2/2, triceps 2/2, patellar 2/2, Achilles trace, plantar responses were flexor bilaterally.  DIAGNOSTIC DATA (LABS, IMAGING, TESTING) - I reviewed patient records, labs, notes, testing and imaging myself where available.  Lab Results  Component Value Date   WBC 10.1 03/26/2013   HGB 14.2 03/26/2013   HCT 42.3 03/26/2013   MCV 87.3 03/26/2013   PLT 208.0 03/26/2013      Component Value Date/Time   NA 137 03/26/2013 1038   K 4.1 03/26/2013 1038   CL 100 03/26/2013 1038   CO2 31 03/26/2013 1038   GLUCOSE 91 03/26/2013 1038   BUN 11 03/26/2013 1038   CREATININE 1.0 03/26/2013 1038   CALCIUM 9.5 03/26/2013 1038   PROT 6.6 10/12/2013 1126   PROT 7.4 03/26/2013 1038   ALBUMIN 4.0 03/26/2013 1038   AST 19 03/26/2013 1038   ALT 24 03/26/2013 1038   ALKPHOS 67 03/26/2013 1038   BILITOT 0.6 03/26/2013 1038   GFRNONAA 63 06/02/2008 1031   GFRAA 76 06/02/2008 1031   Lab Results  Component Value Date  CHOL 215* 03/26/2013   HDL 42.60 03/26/2013   LDLCALC 113* 08/08/2008   LDLDIRECT 157.2 03/26/2013   TRIG 138.0 03/26/2013   CHOLHDL 5 03/26/2013   Lab Results  Component Value Date   HGBA1C 5.7* 10/12/2013   Lab Results  Component Value Date   VITAMINB12 496 10/12/2013   Lab Results  Component Value Date   TSH 2.02 03/26/2013     ASSESSMENT AND PLAN  53 y.o. year old female  has a past medical history of mild axonal peripheral neuropathy.  Change Gabepentin to 3 tabs at hs, Refilled Given patient education material on peripheral neuropathy, signs and symptoms etc. F/U yearly and prn Dennie Bible, [facility], [facility], APRN  [facility] [address] [place], [place] [address] [phone]

## 2014-04-11 NOTE — Telephone Encounter (Signed)
Needs a PC to check on pregress before another RX.  Then decide on same dose or change back to previous dose.

## 2014-04-11 NOTE — Patient Instructions (Signed)
Change Gabepentin to 3 tabs at hs F/U yearly and prn

## 2014-04-12 MED ORDER — MEDROXYPROGESTERONE ACETATE 5 MG PO TABS: 5.0000 mg | ORAL_TABLET | Freq: Every day | ORAL | Status: DC

## 2014-04-12 NOTE — Telephone Encounter (Signed)
Celled pt she states she is doing fine. She has no problems with the dose change. Patient denies any concerns and states she is doing well with this dose. She also need refills on provera.

## 2014-04-28 ENCOUNTER — Ambulatory Visit (INDEPENDENT_AMBULATORY_CARE_PROVIDER_SITE_OTHER): Payer: 59 | Admitting: Internal Medicine

## 2014-04-28 ENCOUNTER — Other Ambulatory Visit: Payer: Self-pay | Admitting: Internal Medicine

## 2014-04-28 ENCOUNTER — Other Ambulatory Visit (INDEPENDENT_AMBULATORY_CARE_PROVIDER_SITE_OTHER): Payer: 59

## 2014-04-28 ENCOUNTER — Encounter: Payer: Self-pay | Admitting: Internal Medicine

## 2014-04-28 VITALS — BP 150/88 | HR 93 | Temp 98.4°F | Resp 15 | Ht 67.0 in | Wt 246.2 lb

## 2014-04-28 DIAGNOSIS — Z1211 Encounter for screening for malignant neoplasm of colon: Secondary | ICD-10-CM

## 2014-04-28 DIAGNOSIS — Z0189 Encounter for other specified special examinations: Secondary | ICD-10-CM

## 2014-04-28 DIAGNOSIS — Z0000 Encounter for general adult medical examination without abnormal findings: Secondary | ICD-10-CM

## 2014-04-28 DIAGNOSIS — G629 Polyneuropathy, unspecified: Secondary | ICD-10-CM

## 2014-04-28 LAB — HEPATIC FUNCTION PANEL
ALBUMIN: 4 g/dL (ref 3.5–5.2)
ALT: 19 U/L (ref 0–35)
AST: 21 U/L (ref 0–37)
Alkaline Phosphatase: 50 U/L (ref 39–117)
Bilirubin, Direct: 0.1 mg/dL (ref 0.0–0.3)
Total Bilirubin: 0.5 mg/dL (ref 0.2–1.2)
Total Protein: 7.4 g/dL (ref 6.0–8.3)

## 2014-04-28 LAB — CBC WITH DIFFERENTIAL/PLATELET
BASOS ABS: 0 10*3/uL (ref 0.0–0.1)
Basophils Relative: 0.5 % (ref 0.0–3.0)
EOS ABS: 0.2 10*3/uL (ref 0.0–0.7)
Eosinophils Relative: 2.8 % (ref 0.0–5.0)
HCT: 42.2 % (ref 36.0–46.0)
HEMOGLOBIN: 14.3 g/dL (ref 12.0–15.0)
LYMPHS ABS: 2.2 10*3/uL (ref 0.7–4.0)
Lymphocytes Relative: 26.6 % (ref 12.0–46.0)
MCHC: 33.8 g/dL (ref 30.0–36.0)
MCV: 86.6 fl (ref 78.0–100.0)
Monocytes Absolute: 0.7 10*3/uL (ref 0.1–1.0)
Monocytes Relative: 8.4 % (ref 3.0–12.0)
NEUTROS ABS: 5.2 10*3/uL (ref 1.4–7.7)
Neutrophils Relative %: 61.7 % (ref 43.0–77.0)
Platelets: 282 10*3/uL (ref 150.0–400.0)
RBC: 4.87 Mil/uL (ref 3.87–5.11)
RDW: 12.3 % (ref 11.5–15.5)
WBC: 8.4 10*3/uL (ref 4.0–10.5)

## 2014-04-28 LAB — BASIC METABOLIC PANEL
BUN: 12 mg/dL (ref 6–23)
CHLORIDE: 105 meq/L (ref 96–112)
CO2: 28 meq/L (ref 19–32)
CREATININE: 1 mg/dL (ref 0.4–1.2)
Calcium: 9.3 mg/dL (ref 8.4–10.5)
GFR: 63.49 mL/min (ref >60.00)
Glucose, Bld: 94 mg/dL (ref 70–99)
POTASSIUM: 4.4 meq/L (ref 3.5–5.1)
SODIUM: 138 meq/L (ref 135–145)

## 2014-04-28 LAB — HEMOGLOBIN A1C: HEMOGLOBIN A1C: 5.7 % (ref 4.6–6.5)

## 2014-04-28 LAB — TSH: TSH: 2.42 u[IU]/mL (ref 0.35–4.50)

## 2014-04-28 NOTE — Progress Notes (Signed)
Pre visit review using our clinic review tool, if applicable. No additional management support is needed unless otherwise documented below in the visit note. 

## 2014-04-28 NOTE — Patient Instructions (Signed)
Your next office appointment will be determined based upon review of your pending labs . Those instructions will be transmitted to you through My Chart   As per the Standard of Care , screening Colonoscopy recommended @ 50 & every 5-10 years thereafter . More frequent monitor would be dictated by family history or findings @ Colonoscopy

## 2014-04-28 NOTE — Progress Notes (Signed)
Subjective:    Patient ID: Erica Daugherty, female    DOB: May 14, 1960, 54 y.o.   MRN: [id number]  HPI  She is here for a physical;acute issues include dramatic change in contact prescription.Diabetic screening recommended by her eye doctor.   She is not on a heart healthy diet by her description. She has no regular cardio vascular exercise.  Other than exertional dyspnea; she has no cardiopulmonary symptoms  Based on advanced cholesterol testing her LDL goal should be less than 100.  She did have a C-reactive protein which was elevated at 5.4. A1c was 5.7%. These were done as part of an evaluation for peripheral neuropathy by her neurologist.  She has not had a colonoscopy. She has no active GI symptoms. Standard of care was reviewed.  Review of Systems   Chest pain, palpitations, tachycardia, paroxysmal nocturnal dyspnea, claudication or edema are absent.  Unexplained weight loss, abdominal pain, significant dyspepsia, dysphagia, melena, rectal bleeding, or persistently small caliber stools are denied.      Objective:   Physical Exam Gen.: Healthy and well-nourished in appearance. Alert, appropriate and cooperative throughout exam. As per CDC Guidelines ,Epic documents obesity as being present . Appears younger than stated age  Head: Normocephalic without obvious abnormalities  Eyes: No corneal or conjunctival inflammation noted. Pupils equal round reactive to light and accommodation. Extraocular motion intact.  Ears: External  ear exam reveals no significant lesions or deformities. Canals clear .TMs normal. Hearing is grossly normal bilaterally. Nose: External nasal exam reveals no deformity or inflammation. Nasal mucosa are pink and moist. No lesions or exudates noted.   Mouth: Oral mucosa and oropharynx reveal no lesions or exudates. Teeth in good repair. Neck: No deformities, masses, or tenderness noted. Range of motion & Thyroid normal Lungs: Normal respiratory effort; chest  expands symmetrically. Lungs are clear to auscultation without rales, wheezes, or increased work of breathing. Heart: Normal rate and rhythm. Normal S1 and S2. No gallop, click, or rub. No murmur. Abdomen: Bowel sounds normal; abdomen soft and nontender. No masses, organomegaly or hernias noted. Genitalia: as per Gyn                                  Musculoskeletal/extremities: No deformity or scoliosis noted of  the thoracic or lumbar spine.  No clubbing, cyanosis, edema, or significant extremity  deformity noted.  Range of motion normal . Tone & strength normal. Hand joints normal.  Fingernail health good. Crepitus of knees  Able to lie down & sit up w/o help.  Negative SLR bilaterally Vascular: Carotid, radial artery, dorsalis pedis and  posterior tibial pulses are full and equal. No bruits present. Neurologic: Alert and oriented x3. Deep tendon reflexes symmetrical but 1/2 + @ knees Gait normal      Skin: Intact without suspicious lesions or rashes. Lymph: No cervical, axillary lymphadenopathy present. Psych: Mood and affect are normal. Normally interactive                                                                                       Assessment & Plan:  #  1 comprehensive physical exam; no acute findings  Plan: see Orders  & Recommendations

## 2014-04-29 NOTE — Progress Notes (Signed)
I agree above plan. 

## 2014-04-30 LAB — NMR LIPOPROFILE WITH LIPIDS
Cholesterol, Total: 206 mg/dL — ABNORMAL HIGH (ref 100–199)
HDL Particle Number: 33.3 umol/L (ref >=30.5)
HDL Size: 8.7 nm — ABNORMAL LOW (ref >=9.2)
HDL-C: 49 mg/dL (ref >39)
LARGE VLDL-P: 6.1 nmol/L — AB (ref <=2.7)
LDL (calc): 131 mg/dL — ABNORMAL HIGH (ref 0–99)
LDL Particle Number: 1936 nmol/L — ABNORMAL HIGH (ref <1000)
LDL SIZE: 20.7 nm (ref >=20.8)
LP-IR SCORE: 80 — AB (ref <=45)
Large HDL-P: 4.9 umol/L (ref >=4.8)
Small LDL Particle Number: 1034 nmol/L — ABNORMAL HIGH (ref <=527)
Triglycerides: 129 mg/dL (ref 0–149)
VLDL Size: 56.4 nm — ABNORMAL HIGH (ref <=46.6)

## 2014-05-11 ENCOUNTER — Encounter: Payer: Self-pay | Admitting: Internal Medicine

## 2014-05-11 ENCOUNTER — Ambulatory Visit (INDEPENDENT_AMBULATORY_CARE_PROVIDER_SITE_OTHER): Payer: 59 | Admitting: Internal Medicine

## 2014-05-11 VITALS — BP 126/90 | HR 85 | Temp 98.3°F | Resp 14 | Ht 67.0 in | Wt 249.2 lb

## 2014-05-11 DIAGNOSIS — R059 Cough, unspecified: Secondary | ICD-10-CM

## 2014-05-11 DIAGNOSIS — J324 Chronic pansinusitis: Secondary | ICD-10-CM

## 2014-05-11 DIAGNOSIS — R05 Cough: Secondary | ICD-10-CM

## 2014-05-11 MED ORDER — CEFUROXIME AXETIL 500 MG PO TABS: 500.0000 mg | ORAL_TABLET | Freq: Two times a day (BID) | ORAL | Status: DC

## 2014-05-11 MED ORDER — MUPIROCIN 2 % EX OINT: TOPICAL_OINTMENT | CUTANEOUS | Status: DC

## 2014-05-11 MED ORDER — HYDROCODONE-HOMATROPINE 5-1.5 MG/5ML PO SYRP: 5.0000 mL | ORAL_SOLUTION | Freq: Four times a day (QID) | ORAL | Status: DC | PRN

## 2014-05-11 NOTE — Progress Notes (Signed)
   Subjective:    Patient ID: Erica Daugherty, female    DOB: Oct 24, 1960, 55 y.o.   MRN: [id number]  HPI  She presents with frontal & maxillary sinus pressure and headache, cough, and sneezing. The cough is nonproductive. Actually she is not visualizing any sputum although cough is loose The cough is disturbing sleep  She has symptoms positive for congestion and obstruction. She also has minor symptoms of fatigue and the headache.  She was treated in early December with 10 days of Augmentin for sinusitis. She does not feel that she ever fully recovered & continues to have the sinus pressure/ discomfort symptoms. She has been using over-the-counter medicines without significant response.  Review of Systems She specifically denies fever, otic pain, otic discharge, dental pain, chills, sweats. No extrinsic symptoms.    Objective:   Physical Exam  General appearance:good health ;well nourished; no acute distress or increased work of breathing is present.  No  lymphadenopathy about the head, neck, or axilla noted.   Eyes: No conjunctival inflammation or lid edema is present. There is no scleral icterus.  Ears:  External ear exam shows no significant lesions or deformities.  Otoscopic examination reveals clear canals, tympanic membranes are intact bilaterally without bulging, retraction, inflammation or discharge.  Nose:  External nasal examination shows no deformity or inflammation. Nasal mucosa severely inflammed with "cobblestoning" without lesions or exudates. No septal dislocation or deviation.No obstruction to airflow.   Oral exam: Dental hygiene is good; lips and gums are healthy appearing.There is no oropharyngeal erythema or exudate noted.   Neck:  No deformities, thyromegaly, masses, or tenderness noted.   Supple with full range of motion without pain.   Heart:  Normal rate and regular rhythm. S1 and S2 normal without gallop, murmur, click, rub or other extra sounds.   Lungs:Chest  clear to auscultation; no wheezes, rhonchi,rales ,or rubs present.No increased work of breathing.    Extremities:  No cyanosis, edema, or clubbing  noted    Skin: Warm & dry w/o jaundice or tenting.       Assessment & Plan:  #1 rhinosinusitis  #2 cough , R/O bronchitis  Plan: Nasal hygiene interventions discussed. See prescription medications

## 2014-05-11 NOTE — Progress Notes (Signed)
Pre visit review using our clinic review tool, if applicable. No additional management support is needed unless otherwise documented below in the visit note. 

## 2014-05-11 NOTE — Patient Instructions (Signed)

## 2014-09-05 ENCOUNTER — Other Ambulatory Visit: Payer: Self-pay | Admitting: Nurse Practitioner

## 2014-09-05 NOTE — Telephone Encounter (Signed)
Medication refill request: Provera  Last AEX:  09/30/13 with PG Next AEX: 10/04/14 with PG  Last MMG (if hormonal medication request): 08/10/13 Bi-Rads 1: Negative Refill authorized: Please advise.

## 2014-09-06 NOTE — Telephone Encounter (Signed)
Doesn't she need refill on both hormones?

## 2014-09-06 NOTE — Telephone Encounter (Signed)
Left Message To Call Back  

## 2014-09-08 NOTE — Telephone Encounter (Signed)
Left Message To Call Back  

## 2014-09-09 NOTE — Telephone Encounter (Signed)
Called patient x2 no call back, patient will call back if other hormone is needed.

## 2014-10-04 ENCOUNTER — Encounter: Payer: Self-pay | Admitting: Nurse Practitioner

## 2014-10-04 ENCOUNTER — Ambulatory Visit (INDEPENDENT_AMBULATORY_CARE_PROVIDER_SITE_OTHER): Payer: 59 | Admitting: Nurse Practitioner

## 2014-10-04 ENCOUNTER — Ambulatory Visit: Payer: 59

## 2014-10-04 VITALS — BP 118/76 | HR 88 | Ht 67.0 in | Wt 249.0 lb

## 2014-10-04 DIAGNOSIS — Z79890 Hormone replacement therapy (postmenopausal): Secondary | ICD-10-CM

## 2014-10-04 DIAGNOSIS — Z01419 Encounter for gynecological examination (general) (routine) without abnormal findings: Secondary | ICD-10-CM | POA: Diagnosis not present

## 2014-10-04 DIAGNOSIS — Z23 Encounter for immunization: Secondary | ICD-10-CM | POA: Diagnosis not present

## 2014-10-04 MED ORDER — MEDROXYPROGESTERONE ACETATE 5 MG PO TABS: 5.0000 mg | ORAL_TABLET | Freq: Every day | ORAL | Status: DC

## 2014-10-04 MED ORDER — ESTRADIOL 1 MG PO TABS: 1.0000 mg | ORAL_TABLET | Freq: Every day | ORAL | Status: DC

## 2014-10-04 NOTE — Patient Instructions (Signed)

## 2014-10-04 NOTE — Progress Notes (Signed)
Patient ID: Erica Daugherty, female   DOB: April 06, 1961, 54 y.o.   MRN: [id number] 54 y.o. [id number] Married  Caucasian Fe here for annual exam.  No new health problems.  Patient's last menstrual period was 04/22/2009.          Sexually active: Yes.    The current method of family planning is post menopausal status.    Exercising: No.  The patient does not participate in regular exercise at present. Smoker:  no  Health Maintenance: Pap:  09/30/13, negative with neg HR HPV MMG:  08/10/13, Bi-Rads 1:  Negative Colonoscopy:  Never  - will schedule this year TDaP:  UTD Labs:  PCP   reports that she has never smoked. She has never used smokeless tobacco. She reports that she drinks about 0.6 oz of alcohol per week. She reports that she does not use illicit drugs.  Past Medical History  Diagnosis Date  . Hyperlipidemia   . History of decreased platelet count     Past Surgical History  Procedure Laterality Date  . Shoulder surgery  2012    Left ; Dr Onnie Graham  . Eye surgery  1964    lazy eye  . No colonoscopy      03/26/13  & 04/28/14 SOC reviewed    Current Outpatient Prescriptions  Medication Sig Dispense Refill  . estradiol (ESTRACE) 1 MG tablet Take 1 tablet (1 mg total) by mouth daily. 30 tablet 0  . gabapentin (NEURONTIN) 100 MG capsule Take 3 capsules (300 mg total) by mouth at bedtime. 90 capsule 12  . medroxyPROGESTERone (PROVERA) 5 MG tablet Take 1 tablet (5 mg total) by mouth daily. 30 tablet 0  . Multiple Vitamin (MULTIVITAMIN) tablet Take 1 tablet by mouth daily.    Marland Kitchen tretinoin (RETIN-A) 0.05 % cream Apply topically at bedtime.     No current facility-administered medications for this visit.    Family History  Problem Relation Age of Onset  . Diabetes Mother   . Hyperlipidemia Mother   . Hypertension Mother   . Thyroid nodules Mother   . Testicular cancer Brother   . Diabetes Brother   . Stroke Neg Hx   . Heart disease Neg Hx   . Prostate cancer Father     ROS:   Pertinent items are noted in HPI.  Otherwise, a comprehensive ROS was negative.  Exam:   BP 118/76 mmHg  Pulse 88  Ht 5\' 7"  (1.702 m)  Wt 249 lb (112.946 kg)  BMI 38.99 kg/m2  LMP 04/22/2009 Height: 5\' 7"  (170.2 cm) Ht Readings from Last 3 Encounters:  10/04/14 5\' 7"  (1.702 m)  05/11/14 5\' 7"  (1.702 m)  04/28/14 5\' 7"  (1.702 m)    General appearance: alert, cooperative and appears stated age Head: Normocephalic, without obvious abnormality, atraumatic Neck: no adenopathy, supple, symmetrical, trachea midline and thyroid normal to inspection and palpation Lungs: clear to auscultation bilaterally Breasts: normal appearance, no masses or tenderness Heart: regular rate and rhythm Abdomen: soft, non-tender; no masses,  no organomegaly Extremities: extremities normal, atraumatic, no cyanosis or edema Skin: Skin color, texture, turgor normal. No rashes or lesions Lymph nodes: Cervical, supraclavicular, and axillary nodes normal. No abnormal inguinal nodes palpated Neurologic: Grossly normal   Pelvic: External genitalia:  no lesions              Urethra:  normal appearing urethra with no masses, tenderness or lesions              Bartholin's and  Skene's: normal                 Vagina: normal appearing vagina with normal color and discharge, no lesions              Cervix: anteverted              Pap taken: No. Bimanual Exam:  Uterus:  normal size, contour, position, consistency, mobility, non-tender              Adnexa: no mass, fullness, tenderness               Rectovaginal: Confirms               Anus:  normal sphincter tone, no lesions  Chaperone present:  yes  A:  Well Woman with normal exam  Postmenopausal off bio identical HRT since late 2012 - 2014 Restarted HRT 2015 Atrophic vaginitis - improved  Needs immunization update for TDaP   P:   Reviewed health and wellness pertinent to exam  Pap smear as above  Mammogram is due now and will  schedule  Will refill HRT for a month only until Mammo is done  Counseled with risk of DVT, CVA, cancer, etc  Pt. Left without TDaP getting done - I have called her cell phone and left message that she may return for this.  Counseled on breast self exam, mammography screening, adequate intake of calcium and vitamin D, diet and exercise return annually or prn  An After Visit Summary was printed and given to the patient.

## 2014-10-05 ENCOUNTER — Encounter: Payer: Self-pay | Admitting: Internal Medicine

## 2014-10-06 NOTE — Progress Notes (Signed)
Reviewed personally.  M. Suzanne Michaelyn Wall, MD.  

## 2014-10-14 LAB — HM MAMMOGRAPHY: HM MAMMO: NEGATIVE

## 2014-10-18 ENCOUNTER — Encounter: Payer: Self-pay | Admitting: Internal Medicine

## 2014-11-08 ENCOUNTER — Other Ambulatory Visit: Payer: Self-pay | Admitting: Nurse Practitioner

## 2014-11-08 NOTE — Telephone Encounter (Signed)
Patient said that she has 4 more refills on the Estradiol -eh

## 2014-11-08 NOTE — Telephone Encounter (Signed)
Medication refill request: Provera Last AEX:  10-04-14  Next AEX: 10-09-15 Last MMG (if hormonal medication request): 10-14-14 WNL Refill authorized: please advise

## 2014-11-08 NOTE — Telephone Encounter (Signed)
This pt is on HRT and needs both hormones, not just Provera.  Call her to get status on both.

## 2014-11-21 ENCOUNTER — Encounter: Payer: Self-pay | Admitting: Gastroenterology

## 2014-12-16 ENCOUNTER — Encounter: Payer: 59 | Admitting: Internal Medicine

## 2015-01-02 ENCOUNTER — Ambulatory Visit (AMBULATORY_SURGERY_CENTER): Payer: Self-pay | Admitting: *Deleted

## 2015-01-02 VITALS — Ht 67.5 in | Wt 246.4 lb

## 2015-01-02 DIAGNOSIS — Z1211 Encounter for screening for malignant neoplasm of colon: Secondary | ICD-10-CM

## 2015-01-02 MED ORDER — NA SULFATE-K SULFATE-MG SULF 17.5-3.13-1.6 GM/177ML PO SOLN: 1.0000 | ORAL | Status: DC

## 2015-01-02 NOTE — Progress Notes (Signed)
Patient denies any allergies to eggs or soy. Patient denies any problems with anesthesia/sedation. Patient denies any oxygen use at home and does not take any diet/weight loss medications. EMMI education assisgned to patient on colonoscopy, this was explained and instructions given to patient. 

## 2015-01-17 ENCOUNTER — Encounter: Payer: Self-pay | Admitting: Gastroenterology

## 2015-01-17 ENCOUNTER — Ambulatory Visit (AMBULATORY_SURGERY_CENTER): Payer: 59 | Admitting: Gastroenterology

## 2015-01-17 VITALS — BP 124/97 | HR 76 | Temp 97.8°F | Resp 17 | Ht 67.5 in | Wt 246.0 lb

## 2015-01-17 DIAGNOSIS — D122 Benign neoplasm of ascending colon: Secondary | ICD-10-CM

## 2015-01-17 DIAGNOSIS — Z1211 Encounter for screening for malignant neoplasm of colon: Secondary | ICD-10-CM | POA: Diagnosis not present

## 2015-01-17 MED ORDER — SODIUM CHLORIDE 0.9 % IV SOLN: 500.0000 mL | INTRAVENOUS | Status: DC

## 2015-01-17 NOTE — Progress Notes (Signed)
Called to room to assist during endoscopic procedure.  Patient ID and intended procedure confirmed with present staff. Received instructions for my participation in the procedure from the performing physician.  

## 2015-01-17 NOTE — Op Note (Signed)
[place]  [address] [place], [address]   COLONOSCOPY PROCEDURE REPORT  PATIENT: Erica, Daugherty  MR#: [id number] BIRTHDATE: 25-Feb-1961 , 11  yrs. old GENDER: female ENDOSCOPIST: [place] Cellar, MD REFERRED BY: Unice Cobble, MD PROCEDURE DATE:  01/17/2015 PROCEDURE:   Colonoscopy with biopsy First Screening Colonoscopy - Avg.  risk and is 50 yrs.  old or older Yes.  Prior Negative Screening - Now for repeat screening. N/A  History of Adenoma - Now for follow-up colonoscopy & has been > or = to 3 yrs.  N/A  Polyps removed today? Yes ASA CLASS:   Class II INDICATIONS:Screening for colonic neoplasia and Colorectal Neoplasm Risk Assessment for this procedure is average risk. MEDICATIONS: Propofol 200 mg IV  DESCRIPTION OF PROCEDURE:   After the risks benefits and alternatives of the procedure were thoroughly explained, informed consent was obtained.  The digital rectal exam revealed no abnormalities of the rectum.   The LB [id number]  endoscope was introduced through the anus and advanced to the cecum, which was identified by both the appendix and ileocecal valve. No adverse events experienced.   The quality of the prep was good.  The instrument was then slowly withdrawn as the colon was fully examined. Estimated blood loss is zero unless otherwise noted in this procedure report.  COLON FINDINGS: A sessile polyp measuring 3 mm in size was found in the ascending colon.  A polypectomy was performed with cold forceps.  The resection was complete, the polyp tissue was completely retrieved and sent to histology.   Small arteriovenous malformation was found in the ascending colon, nonbleeding, no intervention was performed.   There was mild diverticulosis noted in the sigmoid colon.   The examination was otherwise normal. Retroflexed views revealed no abnormalities. The time to cecum = 1.8 Withdrawal time = 11.4   The scope was withdrawn and  the procedure completed. COMPLICATIONS: There were no immediate complications.  ENDOSCOPIC IMPRESSION: 1.   Sessile polyp was found in the ascending colon; polypectomy was performed with cold forceps 2.   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   Mild diverticulosis was noted in the sigmoid colon 4.   The examination was otherwise normal  RECOMMENDATIONS: Resume diet Resume medications Await pathology results, this will determine the timining of your next colonoscopy.  eSigned:   Cellar, MD 01/17/2015 8:42 AM   cc:  Unice Cobble MD, the patient

## 2015-01-17 NOTE — Progress Notes (Signed)
TO recovery, report to Jannifer Franklin, Therapist, sports, VSS

## 2015-01-17 NOTE — Progress Notes (Signed)
No problems noted in the recovery room. maw 

## 2015-01-17 NOTE — Patient Instructions (Signed)
YOU HAD AN ENDOSCOPIC PROCEDURE TODAY AT THE [facility]:   Refer to the procedure report that was given to you for any specific questions about what was found during the examination.  If the procedure report does not answer your questions, please call your gastroenterologist to clarify.  If you requested that your care partner not be given the details of your procedure findings, then the procedure report has been included in a sealed envelope for you to review at your convenience later.  YOU SHOULD EXPECT: Some feelings of bloating in the abdomen. Passage of more gas than usual.  Walking can help get rid of the air that was put into your GI tract during the procedure and reduce the bloating. If you had a lower endoscopy (such as a colonoscopy or flexible sigmoidoscopy) you may notice spotting of blood in your stool or on the toilet paper. If you underwent a bowel prep for your procedure, you may not have a normal bowel movement for a few days.  Please Note:  You might notice some irritation and congestion in your nose or some drainage.  This is from the oxygen used during your procedure.  There is no need for concern and it should clear up in a day or so.  SYMPTOMS TO REPORT IMMEDIATELY:   Following lower endoscopy (colonoscopy or flexible sigmoidoscopy):  Excessive amounts of blood in the stool  Significant tenderness or worsening of abdominal pains  Swelling of the abdomen that is new, acute  Fever of 100F or higher  For urgent or emergent issues, a gastroenterologist can be reached at any hour by calling [phone].   DIET: Your first meal following the procedure should be a small meal and then it is ok to progress to your normal diet. Heavy or fried foods are harder to digest and may make you feel nauseous or bloated.  Likewise, meals heavy in dairy and vegetables can increase bloating.  Drink plenty of fluids but you should avoid alcoholic beverages for 24  hours.  ACTIVITY:  You should plan to take it easy for the rest of today and you should NOT DRIVE or use heavy machinery until tomorrow (because of the sedation medicines used during the test).    FOLLOW UP: Our staff will call the number listed on your records the next business day following your procedure to check on you and address any questions or concerns that you may have regarding the information given to you following your procedure. If we do not reach you, we will leave a message.  However, if you are feeling well and you are not experiencing any problems, there is no need to return our call.  We will assume that you have returned to your regular daily activities without incident.  If any biopsies were taken you will be contacted by phone or by letter within the next 1-3 weeks.  Please call us at [phone] if you have not heard about the biopsies in 3 weeks.    SIGNATURES/CONFIDENTIALITY: You and/or your care partner have signed paperwork which will be entered into your electronic medical record.  These signatures attest to the fact that that the information above on your After Visit Summary has been reviewed and is understood.  Full responsibility of the confidentiality of this discharge information lies with you and/or your care-partner.    Handouts were given to your care partner on polyps, diverticulosis, and a high fiber diet with liberal fluid intake. You may resume your   current medications today. Await biopsy results. Please call if any questions or concerns.

## 2015-01-18 ENCOUNTER — Telehealth: Payer: Self-pay | Admitting: *Deleted

## 2015-01-18 NOTE — Telephone Encounter (Signed)
LMOM

## 2015-01-24 ENCOUNTER — Encounter: Payer: Self-pay | Admitting: Gastroenterology

## 2015-04-03 ENCOUNTER — Ambulatory Visit: Payer: 59 | Admitting: Nurse Practitioner

## 2015-04-06 ENCOUNTER — Encounter: Payer: Self-pay | Admitting: Nurse Practitioner

## 2015-04-06 ENCOUNTER — Ambulatory Visit (INDEPENDENT_AMBULATORY_CARE_PROVIDER_SITE_OTHER): Payer: 59 | Admitting: Nurse Practitioner

## 2015-04-06 VITALS — BP 134/90 | HR 91 | Ht 67.0 in | Wt 252.8 lb

## 2015-04-06 DIAGNOSIS — G629 Polyneuropathy, unspecified: Secondary | ICD-10-CM

## 2015-04-06 MED ORDER — GABAPENTIN 300 MG PO CAPS: 300.0000 mg | ORAL_CAPSULE | Freq: Every day | ORAL | Status: DC

## 2015-04-06 NOTE — Patient Instructions (Signed)
Continue Gabapentin at current dose will refill F/U in one year

## 2015-04-06 NOTE — Progress Notes (Signed)
[facility]  PATIENT: Erica Daugherty DOB: [id number]   REASON FOR VISIT: Follow-up for peripheral neuropathy HISTORY FROM: Patient    HISTORY OF PRESENT ILLNESS: HISTORY:Erica Daugherty is a 54 years old right-handed Caucasian female, referred by her primary care physician Dr. Unice Cobble, and pain management Dr. 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 She complains of 6 months history of bilateral feet paresthesia, at plantar surface, pad, numbness, burning sensation, needle prick, She denies weakness, getting sore after weight bearing. She does have low back pain, going down her legs. She denies gait difficulty, no fingers paresthesia, no incontinence, She has brought the laboratory in December 2014, which has demonstrates normal CMP, CBC, TSH, LDL was elevated 152 UPDATE July 9th 2015:She is taking Gabapentin 100mg  bid, which does help her symptoms, Labs showed normal [id number], folic acid, RPR, mild elevated A1c 5.7, normal electrophoresis.Previous electrodiagnostic study dose demonstrate mild axonal peripheral neuropathy, she complains bilateral feet swelling, ankle achiness pain, difficulty walking because of ankle discomfort UPDATE: April 16, 2015: Erica Daugherty, 54 year old female returns for yearly follow-up.  She has a history of mild axonal peripheral neuropathy, she is unable to tolerate gabapentin during the day due to drowsiness. She gets no regular exercise. She has not had any falls.. She continues to have some numbness of the feet. She returns for reevaluation. She needs refills on her medications.  REVIEW OF SYSTEMS: Full 14 system review of systems performed and notable only for those listed, all others are neg:  Constitutional: neg   Cardiovascular: neg Ear/Nose/Throat: neg  Skin: neg Eyes: neg Respiratory: neg Gastroitestinal: neg  Hematology/Lymphatic: neg  Endocrine: neg Musculoskeletal:neg Allergy/Immunology: neg Neurological: neg Psychiatric: neg Sleep : neg   ALLERGIES: No Known Allergies  HOME MEDICATIONS: Outpatient Prescriptions Prior to Visit  Medication Sig Dispense Refill  . estradiol (ESTRACE) 1 MG tablet Take 1 tablet (1 mg total) by mouth daily. 30 tablet 0  . medroxyPROGESTERone (PROVERA) 5 MG tablet TAKE 1 TABLET ONCE DAILY. 90 tablet 3  . Multiple Vitamin (MULTIVITAMIN) tablet Take 1 tablet by mouth daily.    Marland Kitchen tretinoin (RETIN-A) 0.05 % cream Apply topically at bedtime.    . gabapentin (NEURONTIN) 100 MG capsule Take 3 capsules (300 mg total) by mouth at bedtime. 90 capsule 12   No facility-administered medications prior to visit.    PAST MEDICAL HISTORY: Past Medical History  Diagnosis Date  . Hyperlipidemia   . History of decreased platelet count     PAST SURGICAL HISTORY: Past Surgical History  Procedure Laterality Date  . Shoulder surgery  2012    Left ; Dr Onnie Graham  . Eye surgery  1964    lazy eye  . No colonoscopy      03/26/13  & 04/28/14 SOC reviewed    FAMILY HISTORY: Family History  Problem Relation Age of Onset  . Diabetes Mother   . Hyperlipidemia Mother   . Hypertension Mother   . Thyroid nodules Mother   . Testicular cancer Brother   . Diabetes Brother   . Stroke Neg Hx   . Heart disease Neg Hx   . Colon cancer Neg Hx   . Stomach cancer Neg Hx   . Prostate cancer Father     SOCIAL HISTORY: Social History   Social  History  . Marital Status: Married    Spouse Name: [name]  . Number of Children: 4  . Years of Education: college   Occupational History  . PROFESSIONAL PHOTOGRAPHER    Social History Main Topics  . Smoking status: Never Smoker   . Smokeless tobacco: Never Used  . Alcohol Use: 0.6 oz/week    1 Glasses of wine per week      Comment: very rarely  . Drug Use: No  . Sexual Activity:    Partners: Male    Birth Control/ Protection: Post-menopausal, Surgical     Comment: vasectomy   Other Topics Concern  . Not on file   Social History Narrative   Patient lives at home with her husband [name]). Patient works full time.   Education college   Right handed.   Caffeine sometimes one cup of sweet tea.     PHYSICAL EXAM  Filed Vitals:   04/06/15 0852  BP: 134/90  Pulse: 91  Height: 5\' 7"  (1.702 m)  Weight: 252 lb 12.8 oz (114.669 kg)   Body mass index is 39.58 kg/(m^2). Generalized: In no acute distress, obese female, well-groomed Neck: Supple, no carotid bruits  Cardiac: Regular rate rhythm Pulmonary: Clear to auscultation bilaterally Musculoskeletal: No deformity Skin 2+ edema in the feet  Neurological examination Mentation: Alert oriented to time, place, history taking, and causual conversation Cranial nerve II-XII: Pupils were equal round reactive to light. Extraocular movements were full. Visual field were full on confrontational test. Facial sensation and strength were normal. Hearing was intact to finger rubbing bilaterally. Uvula tongue midline. Head turning and shoulder shrug and were normal and symmetric.Tongue protrusion into cheek strength was normal. Motor: Normal tone, bulk and strength. No focal weakness Sensory: Length dependent Intact to fine touch, pinprick, at midshin level, absent vibratory sensation to ankle level Coordination: Normal finger to nose, heel-to-shin bilaterally there was no truncal ataxia Gait: Rising up from seated position without assistance, normal stance, without trunk ataxia, moderate stride, good arm swing, smooth turning, able to perform tiptoe, and heel walking without difficulty. Tandem gait is normal Deep tendon reflexes: Brachioradialis 2/2, biceps 2/2, triceps 2/2, patellar 2/2, Achilles trace, plantar responses were flexor bilaterally. DIAGNOSTIC DATA  (LABS, IMAGING, TESTING) - I reviewed patient records, labs, notes, testing and imaging myself where available.  Lab Results  Component Value Date   WBC 8.4 04/28/2014   HGB 14.3 04/28/2014   HCT 42.2 04/28/2014   MCV 86.6 04/28/2014   PLT 282.0 04/28/2014      Component Value Date/Time   NA 138 04/28/2014 1131   K 4.4 04/28/2014 1131   CL 105 04/28/2014 1131   CO2 28 04/28/2014 1131   GLUCOSE 94 04/28/2014 1131   BUN 12 04/28/2014 1131   CREATININE 1.0 04/28/2014 1131   CALCIUM 9.3 04/28/2014 1131   PROT 7.4 04/28/2014 1131   PROT 6.6 10/12/2013 1126   ALBUMIN 4.0 04/28/2014 1131   AST 21 04/28/2014 1131   ALT 19 04/28/2014 1131   ALKPHOS 50 04/28/2014 1131   BILITOT 0.5 04/28/2014 1131   GFRNONAA 63 06/02/2008 1031   GFRAA 76 06/02/2008 1031   Lab Results  Component Value Date   CHOL 206* 04/28/2014   HDL 49 04/28/2014   LDLCALC 131* 04/28/2014   LDLDIRECT 157.2 03/26/2013   TRIG 129 04/28/2014   CHOLHDL 5 03/26/2013   Lab Results  Component Value Date   HGBA1C 5.7 04/28/2014    Lab Results  Component Value Date  TSH 2.42 04/28/2014      ASSESSMENT AND PLAN  54 y.o. year old female  has a past medical history of mild axonal peripheral neuropathy. She is also noted to have 2+ edema of the feet and tells me she has seen a vein specialist in the past who told her she had incompetent valves.  Continue Gabapentin at current dose will refill Suggest she return to the vein specialist who has seen her in the past for her leg swelling F/U in one year Dennie Bible, [facility], [facility], [facility] [address], [place], [place] [address] [phone]

## 2015-04-07 NOTE — Progress Notes (Signed)
I have reviewed and agreed above plan. 

## 2015-04-12 ENCOUNTER — Ambulatory Visit: Payer: 59 | Admitting: Nurse Practitioner

## 2015-04-20 ENCOUNTER — Other Ambulatory Visit: Payer: Self-pay | Admitting: Nurse Practitioner

## 2015-04-20 NOTE — Telephone Encounter (Signed)
Medication refill request: Estrace 1 mg tablet Last AEX:  10/04/2014 PG Next AEX: 10/09/2015 PG Last MMG (if hormonal medication request): 10/14/2014 BIRADS category 1 negative Refill authorized: Estrace #30 tabs 0 Refills  Today: #30 tabs 0 refills ? Please advise

## 2015-05-10 ENCOUNTER — Encounter: Payer: Self-pay | Admitting: Internal Medicine

## 2015-05-21 ENCOUNTER — Ambulatory Visit (INDEPENDENT_AMBULATORY_CARE_PROVIDER_SITE_OTHER): Payer: 59 | Admitting: Family Medicine

## 2015-05-21 VITALS — BP 138/88 | HR 89 | Temp 98.7°F | Resp 18 | Ht 66.0 in | Wt 249.5 lb

## 2015-05-21 DIAGNOSIS — R030 Elevated blood-pressure reading, without diagnosis of hypertension: Secondary | ICD-10-CM | POA: Diagnosis not present

## 2015-05-21 DIAGNOSIS — J0101 Acute recurrent maxillary sinusitis: Secondary | ICD-10-CM | POA: Diagnosis not present

## 2015-05-21 DIAGNOSIS — J301 Allergic rhinitis due to pollen: Secondary | ICD-10-CM

## 2015-05-21 DIAGNOSIS — IMO0001 Reserved for inherently not codable concepts without codable children: Secondary | ICD-10-CM

## 2015-05-21 MED ORDER — AMOXICILLIN 500 MG PO TABS: 1000.0000 mg | ORAL_TABLET | Freq: Two times a day (BID) | ORAL | Status: DC

## 2015-05-21 MED ORDER — FLUTICASONE PROPIONATE 50 MCG/ACT NA SUSP: 2.0000 | Freq: Every day | NASAL | Status: DC

## 2015-05-21 NOTE — Progress Notes (Signed)
Subjective:    Patient ID: Erica Daugherty, female    DOB: [birth year], 55 y.o.   MRN: [id number]  05/21/2015  Sinus Problem   HPI This 55 y.o. female presents for evaluation of sinus congestion.  Onset two days ago.  Some symptoms before that.  No fever; +chills/sweats.  +HA; +sinus pressure.  No ear pain or sore throat; +teeth pain. +nasal congestion.  +rhinorrhea yellow clear.  +PND.  Mild cough.  No n/v/d.  No rash.  Taken TYlenol Sinus Severe.  Sneezing a lot.  For years thought was Christmas tree.  No Claritin Zyrtec or Flonase.   Blood pressure elevated; has been borderline in the past.    Review of Systems  Constitutional: Positive for chills and diaphoresis. Negative for fever and fatigue.  HENT: Positive for congestion, postnasal drip, rhinorrhea, sinus pressure and voice change. Negative for ear pain, sore throat and trouble swallowing.   Respiratory: Positive for cough. Negative for shortness of breath.   Cardiovascular: Negative for chest pain, palpitations and leg swelling.  Gastrointestinal: Negative for nausea, vomiting, abdominal pain, diarrhea and constipation.  Neurological: Positive for headaches. Negative for light-headedness.    Past Medical History  Diagnosis Date  . Hyperlipidemia   . History of decreased platelet count    Past Surgical History  Procedure Laterality Date  . Shoulder surgery  2012    Left ; Dr Onnie Graham  . Eye surgery  1964    lazy eye  . No colonoscopy      03/26/13  & 04/28/14 SOC reviewed   No Known Allergies  Social History   Social History  . Marital Status: Married    Spouse Name: [name]  . Number of Children: 4  . Years of Education: college   Occupational History  . PROFESSIONAL PHOTOGRAPHER    Social History Main Topics  . Smoking status: Never Smoker   . Smokeless tobacco: Never Used  . Alcohol Use: 0.6 oz/week    1 Glasses of wine per week     Comment: very rarely  . Drug Use: No  . Sexual Activity:    Partners: Male     Birth Control/ Protection: Post-menopausal, Surgical     Comment: vasectomy   Other Topics Concern  . Not on file   Social History Narrative   Patient lives at home with her husband [name]). Patient works full time.   Education college   Right handed.   Caffeine sometimes one cup of sweet tea.   Family History  Problem Relation Age of Onset  . Diabetes Mother   . Hyperlipidemia Mother   . Hypertension Mother   . Thyroid nodules Mother   . Testicular cancer Brother   . Diabetes Brother   . Stroke Neg Hx   . Heart disease Neg Hx   . Colon cancer Neg Hx   . Stomach cancer Neg Hx   . Prostate cancer Father        Objective:    BP 138/88 mmHg  Pulse 89  Temp(Src) 98.7 F (37.1 C) (Oral)  Resp 18  Ht 5\' 6"  (1.676 m)  Wt 249 lb 8 oz (113.172 kg)  BMI 40.29 kg/m2  SpO2 98%  LMP 04/22/2009 Physical Exam  Constitutional: She is oriented to person, place, and time. She appears well-developed and well-nourished. No distress.  HENT:  Head: Normocephalic and atraumatic.  Right Ear: Tympanic membrane, external ear and ear canal normal.  Left Ear: Tympanic membrane, external ear and ear canal normal.  Nose: Mucosal edema and rhinorrhea present. Right sinus exhibits maxillary sinus tenderness. Right sinus exhibits no frontal sinus tenderness. Left sinus exhibits maxillary sinus tenderness. Left sinus exhibits no frontal sinus tenderness.  Mouth/Throat: Uvula is midline, oropharynx is clear and moist and mucous membranes are normal. No oropharyngeal exudate, posterior oropharyngeal edema, posterior oropharyngeal erythema or tonsillar abscesses.  Eyes: Conjunctivae are normal. Pupils are equal, round, and reactive to light.  Neck: Normal range of motion. Neck supple.  Cardiovascular: Normal rate, regular rhythm and normal heart sounds.  Exam reveals no gallop and no friction rub.   No murmur heard. Pulmonary/Chest: Effort normal and breath sounds normal. She has no wheezes. She  has no rales.  Lymphadenopathy:    She has no cervical adenopathy.  Neurological: She is alert and oriented to person, place, and time.  Skin: She is not diaphoretic.  Psychiatric: She has a normal mood and affect. Her behavior is normal.  Nursing note and vitals reviewed.       Assessment & Plan:   1. Acute recurrent maxillary sinusitis   2. Non-seasonal allergic rhinitis due to pollen   3. Elevated BP     1. Acute sinusitis: New.  Rx for Amoxicillin provided. 2. Allergic Rhinitis: uncontrolled; rx for Flonase provided; Claritin daily starting 12/1 each year. 3.  Elevated blood pressure: recommend checking in two weeks when acute illness has resolved.   No orders of the defined types were placed in this encounter.   Meds ordered this encounter  Medications  . meloxicam (MOBIC) 15 MG tablet    Sig: Take 15 mg by mouth daily.  Marland Kitchen amoxicillin (AMOXIL) 500 MG tablet    Sig: Take 2 tablets (1,000 mg total) by mouth 2 (two) times daily.    Dispense:  40 tablet    Refill:  0  . fluticasone (FLONASE) 50 MCG/ACT nasal spray    Sig: Place 2 sprays into both nostrils daily.    Dispense:  16 g    Refill:  12    No Follow-up on file.    Kristi Elayne Guerin, M.D. [facility] [address] [place], [place]  [address] [phone] phone [phone] fax

## 2015-05-21 NOTE — Patient Instructions (Signed)
1. Claritin/Loratadine 10mg  one daily. 2.  Afrin nasal spray --- 2 sprays into each nostril twice daily.  Sinusitis, Adult Sinusitis is redness, soreness, and inflammation of the paranasal sinuses. Paranasal sinuses are air pockets within the bones of your face. They are located beneath your eyes, in the middle of your forehead, and above your eyes. In healthy paranasal sinuses, mucus is able to drain out, and air is able to circulate through them by way of your nose. However, when your paranasal sinuses are inflamed, mucus and air can become trapped. This can allow bacteria and other germs to grow and cause infection. Sinusitis can develop quickly and last only a short time (acute) or continue over a long period (chronic). Sinusitis that lasts for more than 12 weeks is considered chronic. CAUSES Causes of sinusitis include:  Allergies.  Structural abnormalities, such as displacement of the cartilage that separates your nostrils (deviated septum), which can decrease the air flow through your nose and sinuses and affect sinus drainage.  Functional abnormalities, such as when the small hairs (cilia) that line your sinuses and help remove mucus do not work properly or are not present. SIGNS AND SYMPTOMS Symptoms of acute and chronic sinusitis are the same. The primary symptoms are pain and pressure around the affected sinuses. Other symptoms include:  Upper toothache.  Earache.  Headache.  Bad breath.  Decreased sense of smell and taste.  A cough, which worsens when you are lying flat.  Fatigue.  Fever.  Thick drainage from your nose, which often is green and may contain pus (purulent).  Swelling and warmth over the affected sinuses. DIAGNOSIS Your health care provider will perform a physical exam. During your exam, your health care provider may perform any of the following to help determine if you have acute sinusitis or chronic sinusitis:  Look in your nose for signs of abnormal  growths in your nostrils (nasal polyps).  Tap over the affected sinus to check for signs of infection.  View the inside of your sinuses using an imaging device that has a light attached (endoscope). If your health care provider suspects that you have chronic sinusitis, one or more of the following tests may be recommended:  Allergy tests.  Nasal culture. A sample of mucus is taken from your nose, sent to a lab, and screened for bacteria.  Nasal cytology. A sample of mucus is taken from your nose and examined by your health care provider to determine if your sinusitis is related to an allergy. TREATMENT Most cases of acute sinusitis are related to a viral infection and will resolve on their own within 10 days. Sometimes, medicines are prescribed to help relieve symptoms of both acute and chronic sinusitis. These may include pain medicines, decongestants, nasal steroid sprays, or saline sprays. However, for sinusitis related to a bacterial infection, your health care provider will prescribe antibiotic medicines. These are medicines that will help kill the bacteria causing the infection. Rarely, sinusitis is caused by a fungal infection. In these cases, your health care provider will prescribe antifungal medicine. For some cases of chronic sinusitis, surgery is needed. Generally, these are cases in which sinusitis recurs more than 3 times per year, despite other treatments. HOME CARE INSTRUCTIONS  Drink plenty of water. Water helps thin the mucus so your sinuses can drain more easily.  Use a humidifier.  Inhale steam 3-4 times a day (for example, sit in the bathroom with the shower running).  Apply a warm, moist washcloth to your face  3-4 times a day, or as directed by your health care provider.  Use saline nasal sprays to help moisten and clean your sinuses.  Take medicines only as directed by your health care provider.  If you were prescribed either an antibiotic or antifungal medicine,  finish it all even if you start to feel better. SEEK IMMEDIATE MEDICAL CARE IF:  You have increasing pain or severe headaches.  You have nausea, vomiting, or drowsiness.  You have swelling around your face.  You have vision problems.  You have a stiff neck.  You have difficulty breathing.   This information is not intended to replace advice given to you by your health care provider. Make sure you discuss any questions you have with your health care provider.   Document Released: 04/08/2005 Document Revised: 04/29/2014 Document Reviewed: 04/23/2011 Elsevier Interactive Patient Education [employer].

## 2015-05-24 ENCOUNTER — Other Ambulatory Visit: Payer: Self-pay | Admitting: Certified Nurse Midwife

## 2015-05-24 NOTE — Telephone Encounter (Signed)
Medication refill request: Estradiol Last AEX:  10-04-14 Next AEX: 10-09-15 Last MMG (if hormonal medication request): 10-14-14 WNL  Refill authorized: please advise

## 2015-07-31 ENCOUNTER — Telehealth: Payer: Self-pay | Admitting: Nurse Practitioner

## 2015-07-31 DIAGNOSIS — N950 Postmenopausal bleeding: Secondary | ICD-10-CM

## 2015-07-31 NOTE — Telephone Encounter (Signed)
Patient missed her provera and estradiol prescriptions for a few days and started bleeding. Patient is asking if this could be a side effect of missing these doses?

## 2015-07-31 NOTE — Telephone Encounter (Signed)
Per Dr. Sabra Heck will have her come in for Endo Biopsy.

## 2015-07-31 NOTE — Telephone Encounter (Signed)
Spoke with patient. Advised of message as seen below from Kem Boroughs, [place]. She is agreeable and verbalizes understanding. Appointment scheduled for tomorrow 08/01/2015 at 1 pm with Dr.Miller. She is agreeable to date and time. Advised she will need to take 800 mg of Ibuprofen/Motrin 1 hour prior to her appointment with a good lunch and plenty of fluids. She is agreeable. EMB order placed for precert.  Cc: Dr.Miller  Routing to provider for final review. Patient agreeable to disposition. Will close encounter.

## 2015-07-31 NOTE — Telephone Encounter (Signed)
Spoke with patient. Patient is taking Estradiol 1 mg daily and Provera 5 mg daily. She was out of town and missed 2-3 doses of the medication. Patient started having bleeding like a "light period." LMP was in January 2011. She picked up her new prescriptions today. Is still having light bleeding. Advised patient it is not uncommon to have light bleeding if days of Estradiol and Provera are missed. Advised I will speak with Kem Boroughs, FNP regarding miss pills and bleeding and return call with further recommendations. She is agreeable.

## 2015-08-01 ENCOUNTER — Ambulatory Visit (INDEPENDENT_AMBULATORY_CARE_PROVIDER_SITE_OTHER): Payer: 59 | Admitting: Obstetrics & Gynecology

## 2015-08-01 ENCOUNTER — Encounter: Payer: Self-pay | Admitting: Obstetrics & Gynecology

## 2015-08-01 VITALS — BP 118/86 | HR 80 | Resp 16 | Ht 66.0 in | Wt 255.0 lb

## 2015-08-01 DIAGNOSIS — N950 Postmenopausal bleeding: Secondary | ICD-10-CM | POA: Diagnosis not present

## 2015-08-01 NOTE — Progress Notes (Signed)
GYNECOLOGY  VISIT   HPI: 55 y.o. G5P4004 Married Caucasian female with episode PMP bleeding that is possibly due to missing two or three days of her HRT.  Bleeding is bright red.  This is more like spotting and just when she wipes.  Denies cramping as well.  No breast tenderness.    Patient's last menstrual period was 04/22/2009.    GYNECOLOGIC HISTORY: Patient's last menstrual period was 04/22/2009. Menopausal hormone therapy: estradiol 1.0 and provera 5mg  daily  Patient Active Problem List   Diagnosis Date Noted  . Peripheral neuropathy ([place]) 10/06/2013  . Hyperlipidemia 03/24/2012  . THORACIC/LUMBOSACRAL NEURITIS/RADICULITIS UNSPEC 06/02/2008    Past Medical History  Diagnosis Date  . Hyperlipidemia   . History of decreased platelet count     Past Surgical History  Procedure Laterality Date  . Shoulder surgery  2012    Left ; Dr Onnie Graham  . Eye surgery  1964    lazy eye  . No colonoscopy      03/26/13  & 04/28/14 SOC reviewed    MEDS:  Reviewed in EPIC and UTD  ALLERGIES: Review of patient's allergies indicates no known allergies.  Family History  Problem Relation Age of Onset  . Diabetes Mother   . Hyperlipidemia Mother   . Hypertension Mother   . Thyroid nodules Mother   . Testicular cancer Brother   . Diabetes Brother   . Stroke Neg Hx   . Heart disease Neg Hx   . Colon cancer Neg Hx   . Stomach cancer Neg Hx   . Prostate cancer Father     SH:  Married, non smoker  Review of Systems  All other systems reviewed and are negative.   PHYSICAL EXAMINATION:    BP 118/86 mmHg  Pulse 80  Resp 16  Ht 5\' 6"  (1.676 m)  Wt 255 lb (115.667 kg)  BMI 41.18 kg/m2  LMP 04/22/2009    General appearance: alert, cooperative and appears stated age Abdomen: soft, non-tender; bowel sounds normal; no masses,  no organomegaly  Pelvic: External genitalia:  no lesions              Urethra:  normal appearing urethra with no masses, tenderness or lesions  Bartholins and Skenes: normal                 Vagina: normal appearing vagina with normal color and discharge, no lesions              Cervix: no lesions and blood at os              Bimanual Exam:  Uterus:  normal size, contour, position, consistency, mobility, non-tender              Adnexa: no mass, fullness, tenderness              Anus:  normal sphincter tone, no lesions  Endometrial biopsy recommended.  Discussed with patient.  Verbal and written consent obtained.   Procedure:  Speculum placed.  Cervix visualized and cleansed with betadine prep.  A single toothed tenaculum was applied to the anterior lip of the cervix.  Endometrial pipelle was advanced through the cervix into the endometrial cavity without difficulty.  Pipelle passed to 9cm.  Suction applied and pipelle removed with good tissue sample obtained.  Tenculum removed.  No bleeding noted.  Patient tolerated procedure well.  Chaperone was present for exam.  Assessment: PMP Bleeding on HRT after missing a few days of her pills  Obesity  Plan: Endometrial biopsy pending.  Results and recommendations will be called to pt.  She may need a change in HRT dosage.  Pt recommended to increase provera to BID dosing to make bleeding stop sooner, if desired.

## 2015-08-08 ENCOUNTER — Telehealth: Payer: Self-pay | Admitting: Emergency Medicine

## 2015-08-08 DIAGNOSIS — N950 Postmenopausal bleeding: Secondary | ICD-10-CM

## 2015-08-08 NOTE — Telephone Encounter (Signed)
Call to patient and message from Dr. Sabra Heck given. Patient verbalized understanding and agreeable to planning ultrasound here in office. Ultrasound scheduled for 08/10/15 at 1500, arrive at 1445 for check in.  Patient advised she will be contacted for benefits information and is agreeable.  Routing to provider for final review. Patient agreeable to disposition. Will close encounter.   cc Lerry Liner for insurance pre-certification, referral processing and patient contact.

## 2015-08-08 NOTE — Telephone Encounter (Signed)
-----   Message from Megan Salon, MD sent at 08/05/2015  8:28 AM EDT ----- Please let pt know that the biopsy was negative for abnormal tissue.  I did send a mychart message since it was the holiday weekend.  However, I'd like her to have an ultrasound as the biopsy result did not give any reason for the abnormal bleeding.  Thanks.

## 2015-08-09 ENCOUNTER — Telehealth: Payer: Self-pay | Admitting: Obstetrics & Gynecology

## 2015-08-09 NOTE — Telephone Encounter (Signed)
Spoke with pt regarding benefit for ultrasound. Patient understood and agreeable. Patient ready to schedule. Patient scheduled 08/10/15 with Dr Sabra Heck. Pt aware of arrival date and time. Pt aware of 72 hours cancellation policy with [id number] fee. No further questions. Ok to close

## 2015-08-10 ENCOUNTER — Ambulatory Visit (INDEPENDENT_AMBULATORY_CARE_PROVIDER_SITE_OTHER): Payer: 59 | Admitting: Obstetrics & Gynecology

## 2015-08-10 ENCOUNTER — Ambulatory Visit (INDEPENDENT_AMBULATORY_CARE_PROVIDER_SITE_OTHER): Payer: 59

## 2015-08-10 VITALS — BP 136/86 | HR 88 | Resp 16 | Ht 66.0 in | Wt 259.0 lb

## 2015-08-10 DIAGNOSIS — N950 Postmenopausal bleeding: Secondary | ICD-10-CM | POA: Diagnosis not present

## 2015-08-10 DIAGNOSIS — Z79890 Hormone replacement therapy (postmenopausal): Secondary | ICD-10-CM | POA: Diagnosis not present

## 2015-08-10 NOTE — Progress Notes (Signed)
55 y.o. G43P4004 Married Caucasian female here for pelvic ultrasound due to episode of PMP bleeding that pt felt was possibly due to missing three to four days of her HRT.  Pt underwent pelvic examination and endometrial biopsy on 08/01/15.    Biopsy showed: Endometrium, biopsy - EXTREMELY SCANT INACTIVE ENDOMETRIAL EPITHELIUM. - NO MALIGNANCY.  Due to possible inadequate tissue specimen with biopsy, PUS recommended.  Pt here for this today.  Patient's last menstrual period was 04/22/2009.  Contraception: PMP status  Findings:  UTERUS: 9.0 x 6.1 x 5.0cm EMS:2.64mm, thin and symmetric ADNEXA: Left ovary: 2.0 x 1.1 x 07cm       Right ovary: 2.4 x 1.1 x 1.1cm CUL DE SAC: no free fluid  Discussion:  Findings reviewed with pt.  As endometrium is thin and bleeding has resolve, do not feel additional evluation is necessary.  Pt is back on her HRT and states she will try to do much better and not miss any pills.  She does not want to make a change to a patch or other type of HRT.  Has question about how to get her Rx's all on the same schedule.  Pt will discuss with pharmacist at her local pharmacy.  Assessment:  PMP bleeding, scant endometrium with biopsy but normal PUS today  Plan:  Will follow conservatively.  Pt is back on her HRT.  She knows to call if has any additional bleeding.  ~15 minutes spent with patient >50% of time was in face to face discussion of above.

## 2015-08-11 ENCOUNTER — Encounter: Payer: Self-pay | Admitting: Obstetrics & Gynecology

## 2015-08-11 DIAGNOSIS — Z79890 Hormone replacement therapy (postmenopausal): Secondary | ICD-10-CM | POA: Insufficient documentation

## 2015-10-09 ENCOUNTER — Encounter: Payer: Self-pay | Admitting: Nurse Practitioner

## 2015-10-09 ENCOUNTER — Ambulatory Visit (INDEPENDENT_AMBULATORY_CARE_PROVIDER_SITE_OTHER): Payer: 59 | Admitting: Nurse Practitioner

## 2015-10-09 VITALS — BP 122/80 | HR 84 | Resp 14 | Ht 66.75 in | Wt 257.0 lb

## 2015-10-09 DIAGNOSIS — R8290 Unspecified abnormal findings in urine: Secondary | ICD-10-CM | POA: Diagnosis not present

## 2015-10-09 DIAGNOSIS — Z01419 Encounter for gynecological examination (general) (routine) without abnormal findings: Secondary | ICD-10-CM | POA: Diagnosis not present

## 2015-10-09 DIAGNOSIS — Z0000 Encounter for general adult medical examination without abnormal findings: Secondary | ICD-10-CM

## 2015-10-09 LAB — HEMOGLOBIN A1C
Hgb A1c MFr Bld: 5.5 % (ref <5.7)
MEAN PLASMA GLUCOSE: 111 mg/dL

## 2015-10-09 LAB — POCT URINALYSIS DIPSTICK
Bilirubin, UA: NEGATIVE
Glucose, UA: NEGATIVE
Ketones, UA: NEGATIVE
NITRITE UA: NEGATIVE
Protein, UA: NEGATIVE
UROBILINOGEN UA: NEGATIVE
pH, UA: 5

## 2015-10-09 LAB — CBC WITH DIFFERENTIAL/PLATELET
BASOS ABS: 0 {cells}/uL (ref 0–200)
Basophils Relative: 0 %
EOS ABS: 368 {cells}/uL (ref 15–500)
EOS PCT: 4 %
HCT: 42.4 % (ref 35.0–45.0)
Hemoglobin: 14.3 g/dL (ref 11.7–15.5)
LYMPHS PCT: 25 %
Lymphs Abs: 2300 cells/uL (ref 850–3900)
MCH: 29.8 pg (ref 27.0–33.0)
MCHC: 33.7 g/dL (ref 32.0–36.0)
MCV: 88.3 fL (ref 80.0–100.0)
MONOS PCT: 7 %
MPV: 11 fL (ref 7.5–12.5)
Monocytes Absolute: 644 cells/uL (ref 200–950)
NEUTROS ABS: 5888 {cells}/uL (ref 1500–7800)
NEUTROS PCT: 64 %
PLATELETS: 207 10*3/uL (ref 140–400)
RBC: 4.8 MIL/uL (ref 3.80–5.10)
RDW: 13 % (ref 11.0–15.0)
WBC: 9.2 10*3/uL (ref 3.8–10.8)

## 2015-10-09 LAB — COMPREHENSIVE METABOLIC PANEL
ALBUMIN: 3.9 g/dL (ref 3.6–5.1)
ALK PHOS: 47 U/L (ref 33–130)
ALT: 15 U/L (ref 6–29)
AST: 15 U/L (ref 10–35)
BILIRUBIN TOTAL: 0.5 mg/dL (ref 0.2–1.2)
BUN: 16 mg/dL (ref 7–25)
CALCIUM: 9.2 mg/dL (ref 8.6–10.4)
CO2: 26 mmol/L (ref 20–31)
Chloride: 103 mmol/L (ref 98–110)
Creat: 1.02 mg/dL (ref 0.50–1.05)
GLUCOSE: 80 mg/dL (ref 65–99)
POTASSIUM: 4.6 mmol/L (ref 3.5–5.3)
Sodium: 139 mmol/L (ref 135–146)
Total Protein: 6.6 g/dL (ref 6.1–8.1)

## 2015-10-09 LAB — LIPID PANEL
CHOL/HDL RATIO: 3.8 ratio (ref <=5.0)
Cholesterol: 202 mg/dL — ABNORMAL HIGH (ref 125–200)
HDL: 53 mg/dL (ref >=46)
LDL CALC: 119 mg/dL (ref <130)
TRIGLYCERIDES: 149 mg/dL (ref <150)
VLDL: 30 mg/dL (ref <30)

## 2015-10-09 LAB — TSH: TSH: 2.24 m[IU]/L

## 2015-10-09 LAB — HIV ANTIBODY (ROUTINE TESTING W REFLEX): HIV: NONREACTIVE

## 2015-10-09 MED ORDER — MEDROXYPROGESTERONE ACETATE 5 MG PO TABS: 5.0000 mg | ORAL_TABLET | Freq: Every day | ORAL | Status: DC

## 2015-10-09 MED ORDER — ESTRADIOL 1 MG PO TABS: 1.0000 mg | ORAL_TABLET | Freq: Every day | ORAL | Status: DC

## 2015-10-09 NOTE — Patient Instructions (Signed)

## 2015-10-09 NOTE — Progress Notes (Signed)
Encounter reviewed Rodneshia Greenhouse, MD   

## 2015-10-09 NOTE — Progress Notes (Deleted)
55 y.o. [id number] Married  Caucasian Fe here for annual exam.  In April had PMB that was secondary to missed HRT.  Had endo biopsy and PUS which was negative.  No other health problem.  Vaso symptoms are tolerable on HRT.  She has neuropathy in both feet some help with Gabapentin.  Vaginal dryness and decreased libido.  Patient's last menstrual period was 04/22/2009.          Sexually active: Yes.    The current method of family planning is post menopausal status.    Exercising: No.  The patient does not participate in regular exercise at present. Smoker:  no  Health Maintenance: Pap:  09/30/13 Neg. HR HPV:neg MMG:  10/14/14 BIRADS1:neg Colonoscopy:  01/17/15 Polyps. F/u 5 years  TDaP:  10/04/14   Hep C and HIV: No  Labs: Unsure   reports that she has never smoked. She has never used smokeless tobacco. She reports that she drinks about 0.6 oz of alcohol per week. She reports that she does not use illicit drugs.  Past Medical History  Diagnosis Date  . Hyperlipidemia   . History of decreased platelet count     Past Surgical History  Procedure Laterality Date  . Shoulder surgery  2012    Left ; Dr Onnie Graham  . Eye surgery  1964    lazy eye  . No colonoscopy      03/26/13  & 04/28/14 SOC reviewed    Current Outpatient Prescriptions  Medication Sig Dispense Refill  . estradiol (ESTRACE) 1 MG tablet Take 1 tablet (1 mg total) by mouth daily. 90 tablet 4  . gabapentin (NEURONTIN) 300 MG capsule Take 1 capsule (300 mg total) by mouth at bedtime. 30 capsule 11  . medroxyPROGESTERone (PROVERA) 5 MG tablet Take 1 tablet (5 mg total) by mouth daily. 90 tablet 4  . meloxicam (MOBIC) 15 MG tablet Take 15 mg by mouth daily.    Marland Kitchen tretinoin (RETIN-A) 0.05 % cream Apply topically at bedtime.    . Vitamin D, Ergocalciferol, (DRISDOL) 50000 units CAPS capsule Take 1 capsule (50,000 Units total) by mouth every 7 (seven) days. 30 capsule 0   No current facility-administered medications for this visit.     Family History  Problem Relation Age of Onset  . Diabetes Mother   . Hyperlipidemia Mother   . Hypertension Mother   . Thyroid nodules Mother   . Testicular cancer Brother 55  . Diabetes Brother   . Stroke Neg Hx   . Heart disease Neg Hx   . Colon cancer Neg Hx   . Stomach cancer Neg Hx   . Prostate cancer Father 74    ROS:  Pertinent items are noted in HPI.  Otherwise, a comprehensive ROS was negative.  Exam:   BP 122/80 mmHg  Pulse 84  Resp 14  Ht 5' 6.75" (1.695 m)  Wt 257 lb (116.574 kg)  BMI 40.58 kg/m2  LMP 04/22/2009 Height: 5' 6.75" (169.5 cm) Ht Readings from Last 3 Encounters:  10/09/15 5' 6.75" (1.695 m)  08/10/15 5\' 6"  (1.676 m)  08/01/15 5\' 6"  (1.676 m)    General appearance: alert, cooperative and appears stated age Head: Normocephalic, without obvious abnormality, atraumatic Neck: no adenopathy, supple, symmetrical, trachea midline and thyroid normal to inspection and palpation Lungs: clear to auscultation bilaterally Breasts: normal appearance, no masses or tenderness Heart: regular rate and rhythm Abdomen: soft, non-tender; no masses,  no organomegaly Extremities: extremities normal, atraumatic, no cyanosis or edema Skin:  Skin color, texture, turgor normal. No rashes or lesions Lymph nodes: Cervical, supraclavicular, and axillary nodes normal. No abnormal inguinal nodes palpated Neurologic: Grossly normal   Pelvic: External genitalia:  no lesions              Urethra:  normal appearing urethra with no masses, tenderness or lesions              Bartholin's and Skene's: normal                 Vagina: normal appearing vagina with normal color and discharge, no lesions              Cervix: anteverted              Pap taken: No. Bimanual Exam:  Uterus:  normal size, contour, position, consistency, mobility, non-tender              Adnexa: no mass, fullness, tenderness               Rectovaginal: Confirms               Anus:  normal sphincter  tone, no lesions  Chaperone present: no  A:  Well Woman with normal exam  Postmenopausal off bio identical HRT since late 2012 - 2014 Restarted HRT 2015 Atrophic vaginitis - improved   P:   Reviewed health and wellness pertinent to exam  Pap smear as above  Mammogram is due later this month and will schedule  Refill on HRT  Counseled with risk of DVT, CVA, cancer, etc  Will follow with labs  Counseled on breast self exam, mammography screening, use and side effects of HRT, adequate intake of calcium and vitamin D, diet and exercise, Kegel's exercises return annually or prn  An After Visit Summary was printed and given to the patient.  Pt was seen by Kem Boroughs FNP

## 2015-10-10 ENCOUNTER — Other Ambulatory Visit: Payer: Self-pay | Admitting: Nurse Practitioner

## 2015-10-10 DIAGNOSIS — E559 Vitamin D deficiency, unspecified: Secondary | ICD-10-CM

## 2015-10-10 LAB — URINE CULTURE

## 2015-10-10 LAB — HEPATITIS C ANTIBODY: HCV AB: NEGATIVE

## 2015-10-10 LAB — VITAMIN D 25 HYDROXY (VIT D DEFICIENCY, FRACTURES): Vit D, 25-Hydroxy: 14 ng/mL — ABNORMAL LOW (ref 30–100)

## 2015-10-10 MED ORDER — VITAMIN D (ERGOCALCIFEROL) 1.25 MG (50000 UNIT) PO CAPS: 50000.0000 [IU] | ORAL_CAPSULE | ORAL | Status: DC

## 2015-10-11 NOTE — Progress Notes (Addendum)
55 y.o. [id number] Married  Caucasian Fe here for annual exam.  In April had PMB that was secondary to missed HRT.  Had endo biopsy and PUS which was negative.  No other health problem.  Vaso symptoms are tolerable on HRT.  She has neuropathy in both feet some help with Gabapentin.  Vaginal dryness and decreased libido.  Patient's last menstrual period was 04/22/2009.          Sexually active: Yes.    The current method of family planning is post menopausal status.    Exercising: No.  The patient does not participate in regular exercise at present. Smoker:  no  Health Maintenance: Pap:  09/30/13 Neg. HR HPV:neg MMG:  10/14/14 BIRADS1:neg Colonoscopy:  01/17/15 Polyps. F/u 5 years  TDaP:  10/04/14   Hep C and HIV: No  Labs: Unsure  Urine:  Moderate leuk's   reports that she has never smoked. She has never used smokeless tobacco. She reports that she drinks about 0.6 oz of alcohol per week. She reports that she does not use illicit drugs.  Past Medical History  Diagnosis Date  . Hyperlipidemia   . History of decreased platelet count     Past Surgical History  Procedure Laterality Date  . Shoulder surgery  2012    Left ; Dr Onnie Graham  . Eye surgery  1964    lazy eye  . No colonoscopy      03/26/13  & 04/28/14 SOC reviewed    Current Outpatient Prescriptions  Medication Sig Dispense Refill  . estradiol (ESTRACE) 1 MG tablet Take 1 tablet (1 mg total) by mouth daily. 90 tablet 4  . gabapentin (NEURONTIN) 300 MG capsule Take 1 capsule (300 mg total) by mouth at bedtime. 30 capsule 11  . medroxyPROGESTERone (PROVERA) 5 MG tablet Take 1 tablet (5 mg total) by mouth daily. 90 tablet 4  . meloxicam (MOBIC) 15 MG tablet Take 15 mg by mouth daily.    Marland Kitchen tretinoin (RETIN-A) 0.05 % cream Apply topically at bedtime.    . Vitamin D, Ergocalciferol, (DRISDOL) 50000 units CAPS capsule Take 1 capsule (50,000 Units total) by mouth every 7 (seven) days. 30 capsule 0   No current facility-administered  medications for this visit.    Family History  Problem Relation Age of Onset  . Diabetes Mother   . Hyperlipidemia Mother   . Hypertension Mother   . Thyroid nodules Mother   . Testicular cancer Brother 63  . Diabetes Brother   . Stroke Neg Hx   . Heart disease Neg Hx   . Colon cancer Neg Hx   . Stomach cancer Neg Hx   . Prostate cancer Father 46    ROS:  Pertinent items are noted in HPI.  Otherwise, a comprehensive ROS was negative.  Exam:   BP 122/80 mmHg  Pulse 84  Resp 14  Ht 5' 6.75" (1.695 m)  Wt 257 lb (116.574 kg)  BMI 40.58 kg/m2  LMP 04/22/2009 Height: 5' 6.75" (169.5 cm) Ht Readings from Last 3 Encounters:  10/09/15 5' 6.75" (1.695 m)  08/10/15 5\' 6"  (1.676 m)  08/01/15 5\' 6"  (1.676 m)    General appearance: alert, cooperative and appears stated age Head: Normocephalic, without obvious abnormality, atraumatic Neck: no adenopathy, supple, symmetrical, trachea midline and thyroid normal to inspection and palpation Lungs: clear to auscultation bilaterally Breasts: normal appearance, no masses or tenderness Heart: regular rate and rhythm Abdomen: soft, non-tender; no masses,  no organomegaly Extremities: extremities normal, atraumatic,  no cyanosis or edema Skin: Skin color, texture, turgor normal. No rashes or lesions Lymph nodes: Cervical, supraclavicular, and axillary nodes normal. No abnormal inguinal nodes palpated Neurologic: Grossly normal   Pelvic: External genitalia:  no lesions              Urethra:  normal appearing urethra with no masses, tenderness or lesions              Bartholin's and Skene's: normal                 Vagina: normal appearing vagina with normal color and discharge, no lesions              Cervix: anteverted              Pap taken: No. Bimanual Exam:  Uterus:  normal size, contour, position, consistency, mobility, non-tender              Adnexa: no mass, fullness, tenderness               Rectovaginal: Confirms                Anus:  normal sphincter tone, no lesions  Chaperone present: no  A:  Well Woman with normal exam  Postmenopausal off bio identical HRT since late 2012 - 2014 Restarted HRT 2015 Atrophic vaginitis - improved   P:   Reviewed health and wellness pertinent to exam  Pap smear as above  Mammogram is due later this month and will schedule  Refill on HRT  Counseled with risk of DVT, CVA, cancer, etc  Will follow with labs and urine  Counseled on breast self exam, mammography screening, use and side effects of HRT, adequate intake of calcium and vitamin D, diet and exercise, Kegel's exercises return annually or prn  An After Visit Summary was printed and given to the patient.  Pt was seen by Kem Boroughs FNP

## 2015-11-16 ENCOUNTER — Telehealth: Payer: Self-pay | Admitting: Nurse Practitioner

## 2015-11-16 NOTE — Telephone Encounter (Signed)
Patient states had AEX last month and was not having any problems at the time. States on Friday, 11/10/15 she had some spotting, nothing heavy. Patient noted yesterday 11/15/15 and today more spotting. Advised patient I will forward her concerns to our triage staff for review and a return call. Patient is agreeable to this.  Routing to Triage

## 2015-11-16 NOTE — Telephone Encounter (Signed)
Spoke with patient. She reports spotting for two days and some last week. No missed HRT.  No dysuria, fevers or pelvic pain.  Office visit with Dr. Sabra Heck scheduled for tomorrow at 0915 and patient agreeeable.  Advised patient to call back or seek immediate medical care if bleeding worsens or soaking through 1 pad/tampon per hour for two hours or if becomes symptomatic with sob, chest pain, fatigued, lightheaded, or weakness.  Patient verbalized understanding and will follow up as scheduled.  Had PMB in April with ultrasound and Endometrial biopsy.

## 2015-11-17 ENCOUNTER — Ambulatory Visit (INDEPENDENT_AMBULATORY_CARE_PROVIDER_SITE_OTHER): Payer: 59 | Admitting: Obstetrics & Gynecology

## 2015-11-17 ENCOUNTER — Encounter: Payer: Self-pay | Admitting: Obstetrics & Gynecology

## 2015-11-17 VITALS — BP 118/72 | HR 70 | Resp 16 | Ht 66.75 in | Wt 242.0 lb

## 2015-11-17 DIAGNOSIS — N950 Postmenopausal bleeding: Secondary | ICD-10-CM

## 2015-11-17 NOTE — Progress Notes (Signed)
GYNECOLOGY  VISIT   HPI: 55 y.o. G51P4004 Married [place] female with PMP bleeding on 7/21, 7/26, and 7/27. This was just spotty and dark and like old blood.  She's been on HRT with estrace and provera for almost two years.  Initially she started on 0.5mg  estradiol and 2.5mg  MPA.  Pt had bleeding in April and had an endometrial biopsy showing inactive endometrium as well as an ultrasound with endometrium of 2.104mm.    D/w pt evaluation that has already been done.  Do not feel she needs repeat biopsy.  Would like to change how she is taking her HRT.  She will continue with the estradiol every day and take her provera days 1-14 only.  Pt knows she may have spotting after the provera and that is fine.  I'd like updates about bleeding.  If she goes for three months without bleeding, she will then go back to taking both together.    Last pap 6/15:  Neg pap and neg HR HPV  Patient Active Problem List   Diagnosis Date Noted  . Postmenopausal HRT (hormone replacement therapy) 08/11/2015  . Peripheral neuropathy ([place]) 10/06/2013  . Hyperlipidemia 03/24/2012  . THORACIC/LUMBOSACRAL NEURITIS/RADICULITIS UNSPEC 06/02/2008    Past Medical History:  Diagnosis Date  . History of decreased platelet count   . Hyperlipidemia     Past Surgical History:  Procedure Laterality Date  . EYE SURGERY  1964   lazy eye  . no colonoscopy     03/26/13  & 04/28/14 SOC reviewed  . SHOULDER SURGERY  2012   Left ; Dr Onnie Graham    MEDS:  Reviewed in EPIC and UTD  ALLERGIES: Review of patient's allergies indicates no known allergies.  Family History  Problem Relation Age of Onset  . Diabetes Mother   . Hyperlipidemia Mother   . Hypertension Mother   . Thyroid nodules Mother   . Testicular cancer Brother 52  . Diabetes Brother   . Stroke Neg Hx   . Heart disease Neg Hx   . Colon cancer Neg Hx   . Stomach cancer Neg Hx   . Prostate cancer Father 23    Soc Hx:  Married, non smoker  ROS  PHYSICAL EXAMINATION:     BP 118/72 (BP Location: Right Arm, Patient Position: Sitting, Cuff Size: Large)   Pulse 70   Resp 16   Ht 5' 6.75" (1.695 m)   Wt 242 lb (109.8 kg)   LMP 04/22/2009   BMI 38.19 kg/m     General appearance: alert, cooperative and appears stated age  Pelvic: External genitalia:  no lesions              Urethra:  normal appearing urethra with no masses, tenderness or lesions              Bartholins and Skenes: normal                 Vagina: normal appearing vagina with normal color and discharge, no lesions              Cervix: no lesions              Bimanual Exam:  Uterus:  normal size, contour, position, consistency, mobility, non-tender              Adnexa: no mass, fullness, tenderness       Assessment: PMP bleeding with negative endometrial biopsy and normal PUS (endometrium 2.65mm) 4/17  Plan: Pt will continue estradiol  1.0mg  and change to provera 5mg  daily 1-14 each month. Pt knows to call with any future bleeding

## 2015-11-17 NOTE — Patient Instructions (Signed)
Starting the beginning of August, continue to take the estradiol 1.0mg  daily.  Take your medroxyprogesterone 5mg  days 1-14 (first weeks) of the month, only.    If you have bleeding, let me know.   Once you've done this for three months and haven't had bleeding, you can go back to taking both daily.

## 2016-01-01 ENCOUNTER — Telehealth: Payer: Self-pay | Admitting: Nurse Practitioner

## 2016-01-01 DIAGNOSIS — N950 Postmenopausal bleeding: Secondary | ICD-10-CM

## 2016-01-01 NOTE — Telephone Encounter (Addendum)
Spoke with patient. Advised of message as seen below from [place]. Patient is agreeable. Appointment for EMB scheduled for 01/04/2016 at 9:15 am with Dr.Miller.   Instructions given. Motrin 800 mg po x , one hour before appointment with food. Make sure to eat a meal before appointment and drink plenty of fluids. EMB order placed. Advised if bleeding increases to having to change her pad every hour due to bleeding through for more than 2 hours or develops and new symptoms she will need to be seen for immediate evaluation with our office or [facility]. She is agreeable and verbalizes understanding.  Cc: Lerry Liner  Routing to provider for final review. Patient agreeable to disposition. Will close encounter.

## 2016-01-01 NOTE — Telephone Encounter (Signed)
She needs a repeat endometrial biopsy.  Ultrasound was completely normal.  Could you please schedule this.

## 2016-01-01 NOTE — Telephone Encounter (Signed)
Patient called and said, "I am having spotting again. This is the heaviest it has been."  Last seen 11/17/15

## 2016-01-01 NOTE — Telephone Encounter (Signed)
Patient was last seen on 11/17/2015 with Dr.Miller for PMB (please see OV note below).  Reports she started bleeding today. "I woke up this morning and had bled through my pajama pants." Patient is currently wearing a panty liner that she is changing every 3-4 hours. Patient is currently taking Estradiol 1 mg daily and Provera 5 mg days 1-14 of the month. Reports she has 3 days left of taking the Provera for this month. Denies missing any pills or taking any pills late. "This bleeding is just heavier than usual." Denies any cramping or pelvic pain. Patient had PUS and EMB in 07/2015. Advised patient I will speak with Dr.Miller and return call with further recommendations. She is agreeable.  Assessment: PMP bleeding with negative endometrial biopsy and normal PUS (endometrium 2.16mm) 4/17  Plan: Pt will continue estradiol 1.0mg  and change to provera 5mg  daily 1-14 each month. Pt knows to call with any future bleeding  Dr.Miller, would you like me to schedule the patient a follow up ultrasound appointment to assess bleeding at this time?

## 2016-01-02 ENCOUNTER — Ambulatory Visit (INDEPENDENT_AMBULATORY_CARE_PROVIDER_SITE_OTHER): Payer: 59 | Admitting: Obstetrics & Gynecology

## 2016-01-02 ENCOUNTER — Telehealth: Payer: Self-pay | Admitting: Obstetrics & Gynecology

## 2016-01-02 ENCOUNTER — Encounter: Payer: Self-pay | Admitting: Obstetrics & Gynecology

## 2016-01-02 VITALS — BP 128/66 | HR 76 | Resp 14 | Ht 67.5 in | Wt 224.2 lb

## 2016-01-02 DIAGNOSIS — N950 Postmenopausal bleeding: Secondary | ICD-10-CM

## 2016-01-02 MED ORDER — NORETHINDRONE ACETATE 5 MG PO TABS: 10.0000 mg | ORAL_TABLET | Freq: Two times a day (BID) | ORAL | 0 refills | Status: DC

## 2016-01-02 NOTE — Telephone Encounter (Signed)
Called patient to convey benefits for procedure, scheduled for 01/04/16. During this conversation, patient wanted to let her doctor know, the bleeding is heavier, "very heavy full blown out bleeding, like a period". Patient states she has had spotting before, but nothing like this. Patient is concerned and wants to know if she should see doctor today.  Routing to Triage for review

## 2016-01-02 NOTE — Progress Notes (Signed)
GYNECOLOGY  VISIT   HPI: 55 y.o. G64P4004 Married Caucasian female with an episode of PMP bleeding.  This is the third episode of bleeding this year.  The first one was in April and that was after she missed a couple of her HRT pills.  PUS was normal with thin endometrium measuring 2.82mm.  Endometrial biopsy was done then as well showing: Endometrium, biopsy - EXTREMELY SCANT INACTIVE ENDOMETRIAL EPITHELIUM. - NO MALIGNANCY.  She then had some very light spotting in July.  Her progesterone was adjusted at that time to provera for days 1-15.  In August, she reports very light spotting but did not call about this.  However, this month she started bleeding, just like a period, two days ago.  It picked up today and she is wearing a regular pad.  She is currently on day 12 of the provera.   Denies dizziness, SOB, palpitations, or fatigue.  Daughter getting married at the end of the month so she just doesn't feel like she has "time for this".    Patient's last menstrual period was 04/22/2009.     Patient Active Problem List   Diagnosis Date Noted  . Postmenopausal HRT (hormone replacement therapy) 08/11/2015  . Peripheral neuropathy ([place]) 10/06/2013  . Hyperlipidemia 03/24/2012  . THORACIC/LUMBOSACRAL NEURITIS/RADICULITIS UNSPEC 06/02/2008    Past Medical History:  Diagnosis Date  . History of decreased platelet count   . Hyperlipidemia     Past Surgical History:  Procedure Laterality Date  . EYE SURGERY  1964   lazy eye  . no colonoscopy     03/26/13  & 04/28/14 SOC reviewed  . SHOULDER SURGERY  2012   Left ; Dr Onnie Graham    MEDS:  Reviewed in EPIC and UTD  ALLERGIES: Review of patient's allergies indicates no known allergies.  Family History  Problem Relation Age of Onset  . Diabetes Mother   . Hyperlipidemia Mother   . Hypertension Mother   . Thyroid nodules Mother   . Testicular cancer Brother 63  . Diabetes Brother   . Stroke Neg Hx   . Heart disease Neg Hx   . Colon  cancer Neg Hx   . Stomach cancer Neg Hx   . Prostate cancer Father 40    SH:  Married, non smoker  Review of Systems  Psychiatric/Behavioral: Memory loss:    All other systems reviewed and are negative.   PHYSICAL EXAMINATION:    BP 128/66 (BP Location: Right Arm, Patient Position: Sitting, Cuff Size: Normal)   Pulse 76   Resp 14   Ht 5' 7.5" (1.715 m)   Wt 224 lb 3.2 oz (101.7 kg)   LMP 04/22/2009   BMI 34.60 kg/m     General appearance: alert, cooperative and appears stated age Abdomen: soft, non-tender; bowel sounds normal; no masses,  no organomegaly  Pelvic: External genitalia:  no lesions              Urethra:  normal appearing urethra with no masses, tenderness or lesions              Bartholins and Skenes: normal                 Vagina: normal appearing vagina with normal color and discharge, no lesions              Cervix: no lesions              Bimanual Exam:  Uterus:  normal size, contour, position,  consistency, mobility, non-tender              Adnexa: no mass, fullness, tenderness  Endometrial biopsy recommended.  Discussed with patient.  Verbal and written consent obtained.   Procedure:  Speculum placed.  Cervix visualized and cleansed with betadine prep.  A single toothed tenaculum was not applied to the anterior lip of the cervix.  Endometrial pipelle was advanced through the cervix into the endometrial cavity without difficulty.  Pipelle passed to 7.5cm.  Suction applied and pipelle removed with good tissue sample obtained.  Tenculum removed.  No bleeding noted.  Patient tolerated procedure well.  Chaperone was present for exam.  Assessment: PMP bleeding in pt on HRT with normal PUS and negative endometrial biopsy 4/17  Plan: Biopsy pending from today.  Results will be called to pt. Aygestin 10mg  bid until bleeding stops, then pt can decreased to daily.  Additional recommendations will be made when pathology is back.

## 2016-01-02 NOTE — Telephone Encounter (Signed)
Spoke with patient. Patient is calling to provide an update on her bleeding (see telephone encounter dated 01/01/2016). Patient states that she woke up this morning and her bleeding has increased to the flow of "what a normal cycle used to be for me." Reports she is passing small clots this morning and is concerned her appointment for her EMB will need to be moved forward. Appointment scheduled for today 01/02/2016 at 11:45 am with Dr.Miller (time per Lamont Snowball, RN). Patient is agreeable to date and time.  Instructions given. Motrin 800 mg po x , one hour before appointment with food. Make sure to eat a meal before appointment and drink plenty of fluids.   Routing to provider for final review. Patient agreeable to disposition. Will close encounter.

## 2016-01-04 ENCOUNTER — Ambulatory Visit: Payer: 59 | Admitting: Obstetrics & Gynecology

## 2016-01-10 ENCOUNTER — Telehealth: Payer: Self-pay | Admitting: Nurse Practitioner

## 2016-01-10 NOTE — Telephone Encounter (Signed)
Patient calling for results.

## 2016-01-10 NOTE — Telephone Encounter (Signed)
Spoke with patient. Advised as seen below per Dr. Sabra Heck. Scheduled for recheck 3 months, 04/09/16 at 12:45pm with Dr. Sabra Heck. Patient states she has been taking her HRT as recommended below by Dr. Sabra Heck. Patient reports bleeding has not stopped and she is not sure why. Advised will review with Dr. Sabra Heck and call with any additional recommendations. Patient is agreeable.     Result Information   Abnormality Status Priority Source   Final result (01/03/2016 0000) Routine Endometrium  Result Notes   Notes Recorded by Megan Salon, MD on 01/10/2016 at 6:02 AM EDT Please let pt know her biopsy was negative. I want her to change how she's taking her HRT. She can continue the estrogen daily but change the progesterone to taking days 1-15 each month. She may have a cycle after this but it will predictable and should steadily decreased until bleeding has stopped. Then can switch back to daily. Recheck 3 months.     Dr. Sabra Heck, please advise? Also, does she need another Endometrial biopsy scheduled?   CC: Erica Daugherty

## 2016-01-11 MED ORDER — NORETHINDRONE ACETATE 5 MG PO TABS: 10.0000 mg | ORAL_TABLET | Freq: Two times a day (BID) | ORAL | 0 refills | Status: DC

## 2016-01-11 NOTE — Telephone Encounter (Signed)
OK to place RF.

## 2016-01-11 NOTE — Telephone Encounter (Signed)
Refill placed, pharmacy on file verified by patient. Aygestin 10mg  BID po #30/0RF.

## 2016-01-11 NOTE — Telephone Encounter (Signed)
Patient is asking to talk with Dr.Miller's nurse regarding her bleeding issues.

## 2016-01-11 NOTE — Telephone Encounter (Signed)
Spoke with patient. Patient states EMB on 01/02/16 with Dr. Sabra Heck. Patient reports bleeding started again on Tues following OV. Patient states she started the Aygestin and bleeding stopped. Patient stated she stopped the Aygestin on Saturday or Sunday, she states she wasn't sure which day. Bleeding started again today. Patient reports "heavy" bleeding; changed pad 2 times today. Patient states she just doesn't know what to do with the continued bleeding. Offered OV, patient declined. Advised patient will review with Dr. Sabra Heck and return call. Patient is agreeable. See telephone encounter below dated 01/10/16.  Dr. Sabra Heck, please advise?

## 2016-01-11 NOTE — Telephone Encounter (Signed)
I need a little clarification.  Did she stop bleeding with the aygestin?  When a person stops, they will restart bleeding.  If she never stopped bleeding, then this is because she stopped the aygestin and that is ok and just wait it out.  Give up date on Monday.  Should be stopped by then.  If not, needs D&C.    If did stop completely, it is ok to restart it.  10mg  bid until bleeding stop, decreased to 10mg  daily and then continue for a month.  When she stops, she will bleed again.

## 2016-01-11 NOTE — Telephone Encounter (Signed)
Spoke with patient. Asked patient to clarify bleeding. Patient states she did stop bleeding when she started the  Aygestin, and the bleeding began after stopping the Aygestin. Advised patient of Dr. Ammie Ferrier recommendations as seen below. Patient states she will try restarting the Aygestin  again. Patient states her daughter is getting married in a week and she would like to avoid surgery at this time. Advised patient to call Monday with update on bleeding. Patient request refill on Aygestin. Verified pharmacy on file. Patient is agreeable.  Dr. Sabra Heck, ok to refill Aygestin?

## 2016-01-12 NOTE — Telephone Encounter (Signed)
Spoke with patient. Advised Aygestin prescription sent to pharmacy.   Routing to provider for final review. Patient is agreeable to disposition. Will close encounter.

## 2016-01-23 LAB — LIPID PANEL
Cholesterol: 162 (ref 0–200)
HDL: 32 — AB (ref 35–70)
LDL CALC: 119
Triglycerides: 53 (ref 40–160)

## 2016-01-23 LAB — HM HIV SCREENING LAB: HM HIV SCREENING: NEGATIVE

## 2016-01-23 LAB — VITAMIN D 25 HYDROXY (VIT D DEFICIENCY, FRACTURES): Vit D, 25-Hydroxy: 30.8

## 2016-01-23 LAB — TSH: TSH: 3.59 (ref <=5.90)

## 2016-01-25 LAB — FECAL OCCULT BLOOD, GUAIAC: Fecal Occult Blood: NEGATIVE

## 2016-02-13 ENCOUNTER — Telehealth: Payer: Self-pay | Admitting: Obstetrics & Gynecology

## 2016-02-13 NOTE — Telephone Encounter (Signed)
Spoke with patient. Patient states she started bleeding 02/12/16 -"light to heavy"- changing 2 pads yesterday with 1 nickel sized clot. Patient reports taking estradiol daily and provera days 1-15 as recommended (started 11/24/15). Patient states stopped taking Aygestin around second week of October, patient states she thought since no refills she would not need to continue taking daily. Advised of previous telephone encounter dated 01/10/16 as seen below. Advised I will need to review with Dr. Sabra Heck and return call with recommendations on medication regimen.     Dr. Sabra Heck, please advise?            Notes Recorded by Megan Salon, MD on 01/10/2016 at 6:02 AM EDT Please let pt know her biopsy was negative. I want her to change how she's taking her HRT. She can continue the estrogen daily but change the progesterone to taking days 1-15 each month. She may have a cycle after this but it will predictable and should steadily decreased until bleeding has stopped. Then can switch back to daily. Recheck 3 months. Megan Salon, MD  to Me   2:53 PM  Note    I need a little clarification.  Did she stop bleeding with the aygestin?  When a person stops, they will restart bleeding.  If she never stopped bleeding, then this is because she stopped the aygestin and that is ok and just wait it out.  Give up date on Monday.  Should be stopped by then.  If not, needs D&C.    If did stop completely, it is ok to restart it.  10mg  bid until bleeding stop, decreased to 10mg  daily and then continue for a month.  When she stops, she will bleed again.

## 2016-02-13 NOTE — Telephone Encounter (Signed)
Patient started spotting again yesterday. Patient was told to call if this happened.

## 2016-02-13 NOTE — Telephone Encounter (Signed)
She should have some bleeding after she stops the Provera (that she is taking days 1-15) each month.  This bleeding is appropriate.  She is correct to be off the aygestin.  Bleeding should last a few days and then stop.  Will restart after next provera but should get lighter and lighter each month.  Please have her give update next month.  Thanks.

## 2016-02-14 NOTE — Telephone Encounter (Signed)
Left message to call Marchetta Navratil at [phone].  

## 2016-02-14 NOTE — Telephone Encounter (Signed)
Patient returning call.

## 2016-02-14 NOTE — Telephone Encounter (Signed)
Spoke with patient. Advised patient as seen below per Dr. Sanjuan Dame request. Patient expressed frustration. Patient states she doesn't understand why at this point in life she is still having bleeding. Advised patient could schedule appt with Dr. Sabra Heck to discuss, pt declined. Patient states she will give the medication another month and see how the bleeding is at that time. Advised patient to call with any further questions/concerns. Patient verbalizes understanding and is agreeable.    Dr. Sabra Heck, any additional recommendations?

## 2016-02-19 ENCOUNTER — Other Ambulatory Visit: Payer: Self-pay | Admitting: Nurse Practitioner

## 2016-02-20 ENCOUNTER — Other Ambulatory Visit: Payer: Self-pay | Admitting: *Deleted

## 2016-02-20 MED ORDER — GABAPENTIN 300 MG PO CAPS: 300.0000 mg | ORAL_CAPSULE | Freq: Every day | ORAL | 3 refills | Status: DC

## 2016-02-20 NOTE — Telephone Encounter (Signed)
I called and spoke to Abigail Butts, Software engineer at [facility].  They did not have the prescription from 04-06-15 for 300mg  cap (take one at bedtime).  I reordered this for pt.  Gave her 30 caps with 3 refills.  She has appt 03-28-16.

## 2016-02-20 NOTE — Telephone Encounter (Signed)
Yes.  Ok to close encounter. 

## 2016-02-20 NOTE — Telephone Encounter (Signed)
Dr. Miller, ok to close encounter?  

## 2016-03-28 ENCOUNTER — Encounter: Payer: Self-pay | Admitting: Nurse Practitioner

## 2016-03-28 ENCOUNTER — Ambulatory Visit (INDEPENDENT_AMBULATORY_CARE_PROVIDER_SITE_OTHER): Payer: 59 | Admitting: Nurse Practitioner

## 2016-03-28 VITALS — BP 131/85 | HR 77 | Wt 235.0 lb

## 2016-03-28 DIAGNOSIS — G629 Polyneuropathy, unspecified: Secondary | ICD-10-CM | POA: Diagnosis not present

## 2016-03-28 MED ORDER — GABAPENTIN 300 MG PO CAPS: 300.0000 mg | ORAL_CAPSULE | Freq: Every day | ORAL | 11 refills | Status: DC

## 2016-03-28 NOTE — Progress Notes (Signed)
I have reviewed and agreed above plan. 

## 2016-03-28 NOTE — Patient Instructions (Signed)
Continue Gabapentin at current dose will refill Suggest she return to the vein specialist who has seen her in the past for her leg swelling F/U in one year

## 2016-03-28 NOTE — Progress Notes (Signed)
[facility]  PATIENT: Erica Daugherty DOB: [id number]   REASON FOR VISIT: Follow-up for peripheral neuropathy HISTORY FROM: Patient    HISTORY OF PRESENT ILLNESS: HISTORY:Erica Daugherty is a 55 years old right-handed Caucasian female, referred by her primary care physician Dr. Unice Cobble, and pain management Dr. 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 She complains of 6 months history of bilateral feet paresthesia, at plantar surface, pad, numbness, burning sensation, needle prick, She denies weakness, getting sore after weight bearing. She does have low back pain, going down her legs. She denies gait difficulty, no fingers paresthesia, no incontinence, She has brought the laboratory in December 2014, which has demonstrates normal CMP, CBC, TSH, LDL was elevated 152 UPDATE July 9th 2015:She is taking Gabapentin 100mg  bid, which does help her symptoms, Labs showed normal [id number], folic acid, RPR, mild elevated A1c 5.7, normal electrophoresis.Previous electrodiagnostic study dose demonstrate mild axonal peripheral neuropathy, she complains bilateral feet swelling, ankle achiness pain, difficulty walking because of ankle discomfort UPDATE: April 16, 2015: Erica Daugherty, 55 year old female returns for yearly follow-up.  She has a history of mild axonal peripheral neuropathy, she is unable to tolerate gabapentin during the day due to drowsiness. She gets no regular exercise. She has not had any falls.. She continues to have some numbness of the feet. She returns for reevaluation. She needs refills on her medications. UPDATE 12/07/2017[name], 55 year old female returns for yearly follow-up. She has a history of mild axonal peripheral neuropathy but is unable to  tolerate gabapentin during the day due to drowsiness. She continues to have some numbness of the feet. She gets no regular exercise. She has not had any falls or balance issues. She returns for reevaluation. She needs refills REVIEW OF SYSTEMS: Full 14 system review of systems performed and notable only for those listed, all others are neg:  Constitutional: neg  Cardiovascular: neg Ear/Nose/Throat: neg  Skin: neg Eyes: neg Respiratory: neg Gastroitestinal: neg  Hematology/Lymphatic: neg  Endocrine: neg Musculoskeletal:neg Allergy/Immunology: neg Neurological: Neuropathy Psychiatric: neg Sleep : neg   ALLERGIES: No Known Allergies  HOME MEDICATIONS: Outpatient Medications Prior to Visit  Medication Sig Dispense Refill  . estradiol (ESTRACE) 1 MG tablet Take 1 tablet (1 mg total) by mouth daily. 90 tablet 4  . gabapentin (NEURONTIN) 300 MG capsule Take 1 capsule (300 mg total) by mouth at bedtime. 30 capsule 3  . medroxyPROGESTERone (PROVERA) 5 MG tablet Take 1 tablet (5 mg total) by mouth daily. 90 tablet 4  . meloxicam (MOBIC) 15 MG tablet Take 15 mg by mouth daily.    Marland Kitchen tretinoin (RETIN-A) 0.05 % cream Apply topically at bedtime.    . Vitamin D, Ergocalciferol, (DRISDOL) 50000 units CAPS capsule Take 1 capsule (50,000 Units total) by mouth every 7 (seven) days. 30 capsule 0  . norethindrone (AYGESTIN) 5 MG tablet Take 2 tablets (10 mg total) by mouth 2 (two) times daily. 30 tablet 0   No facility-administered medications prior to visit.     PAST MEDICAL HISTORY: Past Medical History:  Diagnosis Date  . History of decreased platelet count   . Hyperlipidemia     PAST SURGICAL HISTORY: Past Surgical History:  Procedure Laterality Date  . EYE SURGERY  1964   lazy eye  . no colonoscopy  03/26/13  & 04/28/14 SOC reviewed  . SHOULDER SURGERY  2012   Left ; Dr Onnie Graham    FAMILY HISTORY: Family History  Problem Relation Age of Onset  . Diabetes Mother   .  Hyperlipidemia Mother   . Hypertension Mother   . Thyroid nodules Mother   . Testicular cancer Brother 23  . Diabetes Brother   . Prostate cancer Father 35  . Stroke Neg Hx   . Heart disease Neg Hx   . Colon cancer Neg Hx   . Stomach cancer Neg Hx     SOCIAL HISTORY: Social History   Social History  . Marital status: Married    Spouse name: Erica Daugherty  . Number of children: 4  . Years of education: college   Occupational History  . PROFESSIONAL PHOTOGRAPHER Self Employed   Social History Main Topics  . Smoking status: Never Smoker  . Smokeless tobacco: Never Used  . Alcohol use 0.6 oz/week    1 Glasses of wine per week     Comment: very rarely  . Drug use: No  . Sexual activity: Yes    Partners: Male    Birth control/ protection: Post-menopausal, Surgical     Comment: vasectomy   Other Topics Concern  . Not on file   Social History Narrative   Patient lives at home with her husband Erica Daugherty). Patient works full time.   Education college   Right handed.   Caffeine sometimes one cup of sweet tea.     PHYSICAL EXAM  Vitals:   03/28/16 0857  BP: 131/85  Pulse: 77  Weight: 235 lb (106.6 kg)   Body mass index is 36.26 kg/m. Generalized: In no acute distress, obese female, well-groomed Neck: Supple, no carotid bruits  Cardiac: Regular rate rhythm Pulmonary: Clear to auscultation bilaterally Musculoskeletal: No deformity Skin 2+ edema in the feet  Neurological examination Mentation: Alert oriented to time, place, history taking, and causual conversation Cranial nerve II-XII: Pupils were equal round reactive to light. Extraocular movements were full. Visual field were full on confrontational test. Facial sensation and strength were normal. Hearing was intact to finger rubbing bilaterally. Uvula tongue midline. Head turning and shoulder shrug and were normal and symmetric.Tongue protrusion into cheek strength was normal. Motor: Normal tone, bulk and strength. No  focal weakness Sensory: Length dependent Intact to fine touch, pinprick, at midshin level, absent vibratory sensation to ankle level Coordination: Normal finger to nose, heel-to-shin bilaterally there was no truncal ataxia Gait: Rising up from seated position without assistance, normal stance, without trunk ataxia, moderate stride, good arm swing, smooth turning, able to perform tiptoe, and heel walking without difficulty. Tandem gait is normal Deep tendon reflexes: Brachioradialis 2/2, biceps 2/2, triceps 2/2, patellar 2/2, Achilles trace, plantar responses were flexor bilaterally. DIAGNOSTIC DATA (LABS, IMAGING, TESTING) - I reviewed patient records, labs, notes, testing and imaging myself where available.  Lab Results  Component Value Date   WBC 9.2 10/09/2015   HGB 14.3 10/09/2015   HCT 42.4 10/09/2015   MCV 88.3 10/09/2015   PLT 207 10/09/2015      Component Value Date/Time   NA 139 10/09/2015 1527   K 4.6 10/09/2015 1527   CL 103 10/09/2015 1527   CO2 26 10/09/2015 1527   GLUCOSE 80 10/09/2015 1527   BUN 16 10/09/2015 1527   CREATININE 1.02 10/09/2015 1527   CALCIUM 9.2 10/09/2015 1527   PROT 6.6 10/09/2015 1527   PROT 6.6 10/12/2013 1126   ALBUMIN 3.9 10/09/2015  1527   AST 15 10/09/2015 1527   ALT 15 10/09/2015 1527   ALKPHOS 47 10/09/2015 1527   BILITOT 0.5 10/09/2015 1527   GFRNONAA 63 06/02/2008 1031   GFRAA 76 06/02/2008 1031   Lab Results  Component Value Date   CHOL 202 (H) 10/09/2015   HDL 53 10/09/2015   LDLCALC 119 10/09/2015   LDLDIRECT 157.2 03/26/2013   TRIG 149 10/09/2015   CHOLHDL 3.8 10/09/2015   Lab Results  Component Value Date   HGBA1C 5.5 10/09/2015    Lab Results  Component Value Date   TSH 2.24 10/09/2015      ASSESSMENT AND PLAN  55 y.o. year old female  has a past medical history of mild axonal peripheral neuropathy. She is also noted to have 2+ edema of the feet and tells me she has seen a vein specialist in the past who told  her she had incompetent valves.  Continue Gabapentin at current dose will refill Suggest she return to the vein specialist who has seen her in the past for her leg swelling F/U in one year Erica Bible, [facility], [facility], [facility] [address] [place], [place] [address] [phone]

## 2016-04-09 ENCOUNTER — Ambulatory Visit (INDEPENDENT_AMBULATORY_CARE_PROVIDER_SITE_OTHER): Payer: 59 | Admitting: Obstetrics & Gynecology

## 2016-04-09 ENCOUNTER — Encounter: Payer: Self-pay | Admitting: Obstetrics & Gynecology

## 2016-04-09 VITALS — BP 124/70 | HR 68 | Resp 14 | Ht 67.5 in | Wt 238.0 lb

## 2016-04-09 DIAGNOSIS — N950 Postmenopausal bleeding: Secondary | ICD-10-CM

## 2016-04-09 DIAGNOSIS — Z79890 Hormone replacement therapy (postmenopausal): Secondary | ICD-10-CM

## 2016-04-09 NOTE — Progress Notes (Signed)
GYNECOLOGY  VISIT   HPI: 55 y.o. G31P4004 Married Caucasian female here for recheck on her bleeding with HRT.  She experienced bleeding in April and was evaluated with an endometrial biopsy and ultrasound.  Pathology was negative but scant.  Ultrasound showed 2.58mm endometrium.   Pt continued to have some irregular spotting (and is on HRT) so was switched to taking her progesterone days 1-15 each month.  Then she started to have what felt more like a regular cycle.  Repeat biopsy was performed in September showing extensive breakdown.  Since then, her bleeding has improved.  She did cycle in October and November but it was shorter and lighter and she has not cycled this month.   D/w pt switching back to daily dosing of HRT (as this is easier for her) once she has not had any bleeding with progesterone days 1-15 for three months.  She will monitor and let me know if this happens.  She is also aware that she should let me know if she has any irregular bleeding.   We also discussed stopping HRT as this will stop the bleeding.  She is not interested in this.  IUD use discussed as well.  She is not interested in this either.  All questions answered.  GYNECOLOGIC HISTORY: Patient's last menstrual period was 04/22/2009. Menopausal hormone therapy: estrace and provera  Patient Active Problem List   Diagnosis Date Noted  . Postmenopausal HRT (hormone replacement therapy) 08/11/2015  . Peripheral neuropathy ([place]) 10/06/2013  . Hyperlipidemia 03/24/2012  . THORACIC/LUMBOSACRAL NEURITIS/RADICULITIS UNSPEC 06/02/2008    Past Medical History:  Diagnosis Date  . History of decreased platelet count   . Hyperlipidemia     Past Surgical History:  Procedure Laterality Date  . EYE SURGERY  1964   lazy eye  . no colonoscopy     03/26/13  & 04/28/14 SOC reviewed  . SHOULDER SURGERY  2012   Left ; Dr Onnie Graham    MEDS:  Reviewed in EPIC and UTD  ALLERGIES: Patient has no known allergies.  Family History   Problem Relation Age of Onset  . Diabetes Mother   . Hyperlipidemia Mother   . Hypertension Mother   . Thyroid nodules Mother   . Testicular cancer Brother 37  . Diabetes Brother   . Prostate cancer Father 6  . Stroke Neg Hx   . Heart disease Neg Hx   . Colon cancer Neg Hx   . Stomach cancer Neg Hx     SH:  Married, non smoker  Review of Systems  All other systems reviewed and are negative.   PHYSICAL EXAMINATION:    BP 124/70 (BP Location: Right Arm, Patient Position: Sitting, Cuff Size: Large)   Pulse 68   Resp 14   Ht 5' 7.5" (1.715 m)   Wt 238 lb (108 kg)   LMP 04/22/2009   BMI 36.73 kg/m     General appearance: alert, cooperative and appears stated age No other exam performed  Assessment: PMP bleeding, improved and regular with provera days 1-15  Plan: Pt will let me know if does not have bleeding for three months or if has any irregular bleeding.  If no bleeding for 3 months, will switch back to taking estrogen and progesterone daily.  Pt comfortable with plan.   ~15 minutes spent with patient >50% of time was in face to face discussion of above.

## 2016-04-23 DIAGNOSIS — H524 Presbyopia: Secondary | ICD-10-CM | POA: Diagnosis not present

## 2016-05-23 DIAGNOSIS — M1812 Unilateral primary osteoarthritis of first carpometacarpal joint, left hand: Secondary | ICD-10-CM | POA: Diagnosis not present

## 2016-05-27 DIAGNOSIS — R600 Localized edema: Secondary | ICD-10-CM | POA: Diagnosis not present

## 2016-05-30 DIAGNOSIS — R600 Localized edema: Secondary | ICD-10-CM | POA: Diagnosis not present

## 2016-05-31 ENCOUNTER — Other Ambulatory Visit: Payer: Self-pay | Admitting: Nurse Practitioner

## 2016-06-04 ENCOUNTER — Other Ambulatory Visit: Payer: Self-pay | Admitting: Nurse Practitioner

## 2016-07-08 ENCOUNTER — Other Ambulatory Visit: Payer: Self-pay | Admitting: Nurse Practitioner

## 2016-07-08 NOTE — Telephone Encounter (Signed)
Medication refill request: estradiol  Last AEX:  10/09/15 PG  Next AEX: 10/11/16 PG Last MMG (if hormonal medication request): 02/02/16 BIRADS2:Benign  Refill authorized: 10/09/15 #[id number] to [facility]

## 2016-07-10 DIAGNOSIS — Z79899 Other long term (current) drug therapy: Secondary | ICD-10-CM | POA: Diagnosis not present

## 2016-09-10 DIAGNOSIS — R600 Localized edema: Secondary | ICD-10-CM | POA: Diagnosis not present

## 2016-09-10 DIAGNOSIS — E559 Vitamin D deficiency, unspecified: Secondary | ICD-10-CM | POA: Diagnosis not present

## 2016-09-10 DIAGNOSIS — I10 Essential (primary) hypertension: Secondary | ICD-10-CM | POA: Diagnosis not present

## 2016-09-11 LAB — VITAMIN D 25 HYDROXY (VIT D DEFICIENCY, FRACTURES): Vit D, 25-Hydroxy: 57.4

## 2016-10-08 ENCOUNTER — Telehealth: Payer: Self-pay | Admitting: Nurse Practitioner

## 2016-10-08 ENCOUNTER — Encounter: Payer: Self-pay | Admitting: Nurse Practitioner

## 2016-10-08 ENCOUNTER — Ambulatory Visit (INDEPENDENT_AMBULATORY_CARE_PROVIDER_SITE_OTHER): Payer: 59 | Admitting: Nurse Practitioner

## 2016-10-08 ENCOUNTER — Other Ambulatory Visit (HOSPITAL_COMMUNITY)
Admission: RE | Admit: 2016-10-08 | Discharge: 2016-10-08 | Disposition: A | Discharge status: Home / Self Care | Payer: 59 | Source: Ambulatory Visit | Attending: Nurse Practitioner | Admitting: Nurse Practitioner

## 2016-10-08 VITALS — BP 126/84 | HR 76 | Ht 66.25 in | Wt 243.0 lb

## 2016-10-08 DIAGNOSIS — Z0000 Encounter for general adult medical examination without abnormal findings: Secondary | ICD-10-CM

## 2016-10-08 DIAGNOSIS — Z79890 Hormone replacement therapy (postmenopausal): Secondary | ICD-10-CM

## 2016-10-08 DIAGNOSIS — N920 Excessive and frequent menstruation with regular cycle: Secondary | ICD-10-CM

## 2016-10-08 DIAGNOSIS — Z01419 Encounter for gynecological examination (general) (routine) without abnormal findings: Secondary | ICD-10-CM

## 2016-10-08 MED ORDER — MEDROXYPROGESTERONE ACETATE 5 MG PO TABS: 5.0000 mg | ORAL_TABLET | Freq: Every day | ORAL | 4 refills | Status: DC

## 2016-10-08 MED ORDER — ESTRADIOL 0.5 MG PO TABS: 0.5000 mg | ORAL_TABLET | Freq: Every day | ORAL | 4 refills | Status: DC

## 2016-10-08 NOTE — Progress Notes (Signed)
Patient ID: Erica Daugherty, female   DOB: May 28, 1960, 56 y.o.   MRN: [id number]  56 y.o. G4P4004 Married  Caucasian Fe here for annual exam.  From January to April  Regular menses starting on day 18 for 4-5 days.  Flow is heavy for 3 days, then moderate.  No cramps.  Gets breast tenderness and bloating.  She was on Provera day 1-15 and usually had bleeding on day 18.  Then in May missed a week and decided not take Provera at all and had no bleeding.  PCP suggested that pt lower dose of Estradiol to 0.5 mg daily on 09/10/16 ( about 27 days).  Then has restarted Provera on 09/20/16 (off for 45 days) and spotted on 6/18 with now bleeding like a regular cycle.    Patient's last menstrual period was 04/22/2009 (approximate).          Sexually active: Yes.    The current method of family planning is post menopausal status.    Exercising: No.  The patient does not participate in regular exercise at present. Smoker:  no  Health Maintenance: Pap: 09/30/13, Negative with neg HR HPV  03/26/10, Normal per patient History of Abnormal Pap: no MMG: 02/02/16, 3D-yes, Density Category B, Bi-Rads 2:  Benign Self Breast exams: yes Colonoscopy: 01/17/15, tubular adenoma, repeat in 5 years BMD: Never TDaP: 10/04/14 Hep C and HIV: 10/09/15 Labs: PCP takes care of screening   reports that she has never smoked. She has never used smokeless tobacco. She reports that she drinks about 0.6 oz of alcohol per week . She reports that she does not use drugs.  Past Medical History:  Diagnosis Date  . History of decreased platelet count   . Hyperlipidemia     Past Surgical History:  Procedure Laterality Date  . EYE SURGERY  1964   lazy eye  . no colonoscopy     03/26/13  & 04/28/14 SOC reviewed  . SHOULDER SURGERY  2012   Left ; Dr Erica Daugherty    Current Outpatient Prescriptions  Medication Sig Dispense Refill  . Cholecalciferol (VITAMIN D PO) Take 5,000 Int'l Units/day by mouth.    . estradiol (ESTRACE) 1 MG tablet Take 1  tablet (1 mg total) by mouth daily. (Patient taking differently: Take 0.5 mg by mouth daily. ) 90 tablet 4  . gabapentin (NEURONTIN) 300 MG capsule TAKE 1 CAPSULE AT BEDTIME. 31 capsule 11  . medroxyPROGESTERone (PROVERA) 5 MG tablet Take 1 tablet (5 mg total) by mouth daily. 90 tablet 4  . oxybutynin (DITROPAN-XL) 5 MG 24 hr tablet 5 mg daily.    Marland Kitchen triamterene-hydrochlorothiazide (MAXZIDE-25) 37.5-25 MG tablet Take 1 tablet by mouth daily.     No current facility-administered medications for this visit.     Family History  Problem Relation Age of Onset  . Diabetes Mother   . Hyperlipidemia Mother   . Hypertension Mother   . Thyroid nodules Mother   . Testicular cancer Brother 78  . Diabetes Brother   . Prostate cancer Father 40  . Stroke Neg Hx   . Heart disease Neg Hx   . Colon cancer Neg Hx   . Stomach cancer Neg Hx     ROS:  Pertinent items are noted in HPI.  Otherwise, a comprehensive ROS was negative.  Exam:   BP 126/84 (BP Location: Right Arm, Patient Position: Sitting, Cuff Size: Large)   Pulse 76   Ht 5' 6.25" (1.683 m)   Wt 243 lb (110.2  kg)   LMP 04/22/2009 (Approximate)   BMI 38.93 kg/m  Height: 5' 6.25" (168.3 cm) Ht Readings from Last 3 Encounters:  10/08/16 5' 6.25" (1.683 m)  04/09/16 5' 7.5" (1.715 m)  01/02/16 5' 7.5" (1.715 m)    General appearance: alert, cooperative and appears stated age Head: Normocephalic, without obvious abnormality, atraumatic Neck: no adenopathy, supple, symmetrical, trachea midline and thyroid normal to inspection and palpation Lungs: clear to auscultation bilaterally Breasts: normal appearance, no masses or tenderness Heart: regular rate and rhythm Abdomen: soft, non-tender; no masses,  no organomegaly Extremities: extremities normal, atraumatic, no cyanosis or edema Skin: Skin color, texture, turgor normal. No rashes or lesions Lymph nodes: Cervical, supraclavicular, and axillary nodes normal. No abnormal inguinal nodes  palpated Neurologic: Grossly normal   Pelvic: External genitalia:  no lesions              Urethra:  normal appearing urethra with no masses, tenderness or lesions              Bartholin's and Skene's: normal                 Vagina: normal appearing vagina with normal color and moderately heavy bleeding, no lesions              Cervix: anteverted              Pap taken: Yes.   Bimanual Exam:  Uterus:  normal size, contour, position, consistency, mobility, non-tender              Adnexa: no mass, fullness, tenderness               Rectovaginal: Confirms               Anus:  normal sphincter tone, no lesions  Chaperone present: yes  A:  Well Woman with normal exam  Postmenopausal with bio identical HRT late 2012 - 2014  Restarted HRT 2015 due to vaso symptoms  Currently menses monthly with a change in HRT  Bleeding with HRT with a normal PUS and normal EMB 4/17 and repeat EMB 9/18 with extensive breakdown.    P:   Reviewed health and wellness pertinent to exam  Pap smear: yes  Mammogram is due 10/18  Goal was to change her to daily Provera when she stopped having menses - but has not stopped.  Also Estradiol was reduced by PCP.  Will not change dose of HRT and per Dr. Sabra Daugherty will leave her on Estradiol 0.5 mg daily and Provera 5 mg day 1-15 monthly.  Repeat PUS and consult with Dr. Migdalia Daugherty to discuss possible surgical options  Counseled with risk of DVT, CVA, cancer, etc.  Counseled on breast self exam, mammography screening, use and side effects of HRT, adequate intake of calcium and vitamin D, diet and exercise return annually or prn  An After Visit Summary was printed and given to the patient.

## 2016-10-08 NOTE — Telephone Encounter (Signed)
Pt was seen today for AEX.  She is having a regular withdrawal bleed with her HRT.  Her dosing of HRT was recently changed and needed to verify the dosing with Dr. Sabra Heck.  Pt is called and discussed Dr. Ammie Ferrier thoughts about PUS and consult to review surgical options.  Pt is open to getting this done.  She is given the new dose of Estradiol 0.5 mg and Provera 5 mg daily - new RX is sent to pharmacy.  Order is placed for PUS.

## 2016-10-08 NOTE — Patient Instructions (Signed)

## 2016-10-09 NOTE — Telephone Encounter (Signed)
Left message to call Darnetta Kesselman at [phone].  

## 2016-10-09 NOTE — Addendum Note (Signed)
Addended by: Burnice Logan on: 10/09/2016 10:51 AM   Modules accepted: Orders

## 2016-10-09 NOTE — Telephone Encounter (Signed)
Spoke with patient. Offered PUS appt on 10/17/16, patient unavailable. Patient scheduled for PUS on 10/15/16 at 3pm with consult to follow at 3:30pm with Dr. Sabra Heck. Patient verbalizes understanding and is agreeable to date and time.   Routing to provider for final review. Patient is agreeable to disposition. Will close encounter.  Cc: Dr. Sabra Heck; Lerry Liner

## 2016-10-10 LAB — CYTOLOGY - PAP
Diagnosis: NEGATIVE
HPV: NOT DETECTED

## 2016-10-10 NOTE — Progress Notes (Signed)
Reviewed personally.  M. Suzanne Marce Charlesworth, MD.  

## 2016-10-11 ENCOUNTER — Ambulatory Visit: Payer: 59 | Admitting: Nurse Practitioner

## 2016-10-15 ENCOUNTER — Ambulatory Visit (INDEPENDENT_AMBULATORY_CARE_PROVIDER_SITE_OTHER): Payer: 59 | Admitting: Obstetrics & Gynecology

## 2016-10-15 ENCOUNTER — Ambulatory Visit (INDEPENDENT_AMBULATORY_CARE_PROVIDER_SITE_OTHER): Payer: 59

## 2016-10-15 VITALS — BP 138/84 | HR 68 | Resp 14 | Ht 66.25 in | Wt 243.0 lb

## 2016-10-15 DIAGNOSIS — N950 Postmenopausal bleeding: Secondary | ICD-10-CM | POA: Diagnosis not present

## 2016-10-15 DIAGNOSIS — N920 Excessive and frequent menstruation with regular cycle: Secondary | ICD-10-CM | POA: Diagnosis not present

## 2016-10-15 NOTE — Progress Notes (Signed)
56 y.o. G64P4004 Married Caucasian female here for pelvic ultrasound due to postmenopausal bleeding.  Pt reports she's been bleeding every since month since I saw her last.  She reports she starts after stopping the provera and will bleed like a normal cycle for 4-5 days.  She missed taking the provera in May and then did the provera challenge in June and her bleeding was much worse.  This lasted almost 10 days.  Pt has two normal endometrial biopsies done 4/17 and 9/17.  Patient's last menstrual period was 10/07/2016 (exact date).  Contraception: vasectomy  Findings:  UTERUS: 9.7 x 6.6 x 4.9cm  EMS: 3.64mm - 4.62mm, symmetric ADNEXA: Left ovary: 2.1 x 1.1 x 1.1cm       Right ovary: 1.4 x 1.2 x 1.4cm CUL DE SAC: no free fluid  Discussion:  Findings reviewed with pt.  Reviewed prior evlauation.  Feel need to test [facility] and estradiol to confirm she is in menopause as her cycling does not seem to indicate this.  May need to consider other options like Mirena IUD, for example, or micronor.  May even need to consider stopping HRT altogether as she may not need it right now.  Information on IUD given to pt.  Questions answered..  Assessment:  PMP vs perimenopausal bleeding  Plan:  FSH and estradiol will be obtained.  If elevated, will need to consider hysteroscopy with D&C for additional evaluation.  If lower, should consider Mirena IUD vs Micronor.  All questions answered and informational brochures given to pt.  ~15 minutes spent with patient >50% of time was in face to face discussion of above.

## 2016-10-16 LAB — FOLLICLE STIMULATING HORMONE: FSH: 28 m[IU]/mL

## 2016-10-16 LAB — ESTRADIOL: Estradiol: 37.1 pg/mL

## 2016-10-17 ENCOUNTER — Encounter: Payer: Self-pay | Admitting: Obstetrics & Gynecology

## 2016-10-18 ENCOUNTER — Telehealth: Payer: Self-pay | Admitting: *Deleted

## 2016-10-18 NOTE — Telephone Encounter (Signed)
Left message to call Erica Daugherty at [phone].  

## 2016-10-18 NOTE — Telephone Encounter (Signed)
-----   Message from Megan Salon, MD sent at 10/17/2016  9:47 PM EDT ----- Please let pt know her Ward was 28 and estradiol level was 37.  This is not in full menopause range.  Therefore, her bleeding is totally appropriate.  Does she want to consider a Mirena IUD as we discussed in the office?

## 2016-10-21 NOTE — Telephone Encounter (Signed)
Spoke with patient, advised of results and recommendations as seen below per Dr. Sabra Heck. Patient has additional questions about options discussed at last OV, would like to think about this and return call. Patient states she was also provided other options such as surgery. Patient states she thought she was menopausal already given history of menses. Advised patient when perimenopausal, levels can fluctuate until in full menopause. Recommended OV for further discussion with Dr. Sabra Heck, patient declined at this time as she is leaving for vacation. Would like to think about options and will return call. Patient verbalizes understanding and is agreeable.   Routing to provider for final review. Patient is agreeable to disposition. Will close encounter.

## 2016-10-29 DIAGNOSIS — M1812 Unilateral primary osteoarthritis of first carpometacarpal joint, left hand: Secondary | ICD-10-CM | POA: Diagnosis not present

## 2016-11-08 ENCOUNTER — Telehealth: Payer: Self-pay | Admitting: Obstetrics & Gynecology

## 2016-11-08 NOTE — Telephone Encounter (Signed)
Patient would like an appointment with Dr Sabra Heck to go over test results.

## 2016-11-08 NOTE — Telephone Encounter (Signed)
Spoke with patient. Would like to schedule OV to discuss recent labs results and recommendations -see telephone encounter dated 10/18/16. Patient scheduled for 11/12/16 at 10:45am with Dr. Sabra Heck. Patient is agreeable to date and time.  Routing to provider for final review. Patient is agreeable to disposition. Will close encounter.

## 2016-11-12 ENCOUNTER — Ambulatory Visit (INDEPENDENT_AMBULATORY_CARE_PROVIDER_SITE_OTHER): Payer: 59 | Admitting: Obstetrics & Gynecology

## 2016-11-12 ENCOUNTER — Encounter: Payer: Self-pay | Admitting: Obstetrics & Gynecology

## 2016-11-12 VITALS — BP 124/78 | HR 82 | Resp 14 | Wt 243.5 lb

## 2016-11-12 DIAGNOSIS — N950 Postmenopausal bleeding: Secondary | ICD-10-CM | POA: Diagnosis not present

## 2016-11-12 DIAGNOSIS — Z79890 Hormone replacement therapy (postmenopausal): Secondary | ICD-10-CM | POA: Diagnosis not present

## 2016-11-12 NOTE — Progress Notes (Signed)
GYNECOLOGY  VISIT   HPI: 56 y.o. G54P4004 Married Caucasian female here for discussion of persistent PMP bleeding and recent lab work that was obtained 10/15/16 showing Gwynn 28 and estradiol level of 37.1.  Pt was advised this was not consistent with full menopause.  She decreased her HRT in half to 0.5mg  and Provera 5mg  days 1-15 each.  This past cycle was the first month she used the 0.5mg  dosage and she reports her bleeding was much improved and only about three days and much lighter.  Bleeding almost always starts on two or three days after stopping the Provera.  It had been more like 7 days and felt like a regular cycle.  Pt has negative biopsy in 4/17 and 9/17.  Most recent ultrasound showed endometrium of 3 - 4.52mm and symmetric.    GYNECOLOGIC HISTORY: Patient's last menstrual period was 10/07/2016 (exact date). Contraception: none, possible pregnancy risk reviewed Menopausal hormone therapy: estradiol 0.5mg  and Provera 5mg  days 1-15 each month  Patient Active Problem List   Diagnosis Date Noted  . Postmenopausal HRT (hormone replacement therapy) 08/11/2015  . Peripheral neuropathy 10/06/2013  . Hyperlipidemia 03/24/2012  . THORACIC/LUMBOSACRAL NEURITIS/RADICULITIS UNSPEC 06/02/2008    Past Medical History:  Diagnosis Date  . History of decreased platelet count   . Hyperlipidemia     Past Surgical History:  Procedure Laterality Date  . EYE SURGERY  1964   lazy eye  . no colonoscopy     03/26/13  & 04/28/14 SOC reviewed  . SHOULDER SURGERY  2012   Left ; Dr Onnie Graham    MEDS:  Reviewed in EPIC and UTD  ALLERGIES: Patient has no known allergies.  Family History  Problem Relation Age of Onset  . Diabetes Mother   . Hyperlipidemia Mother   . Hypertension Mother   . Thyroid nodules Mother   . Testicular cancer Brother 37  . Diabetes Brother   . Prostate cancer Father 39  . Stroke Neg Hx   . Heart disease Neg Hx   . Colon cancer Neg Hx   . Stomach cancer Neg Hx     SH:   Married, non smoker  Review of Systems  All other systems reviewed and are negative.   PHYSICAL EXAMINATION:    BP 124/78 (BP Location: Right Arm, Patient Position: Sitting, Cuff Size: Large)   Pulse 82   Resp 14   Wt 243 lb 8 oz (110.5 kg)   LMP 10/07/2016 (Exact Date)   BMI 39.01 kg/m     General appearance: alert, cooperative and appears stated age No other exam performed  Discussion:  Discussed with pt possible use of Mirena IUD for control of bleeding, hysteroscopy with D&C for additional evaluation vs stopping HRT to see if this resolve bleeding.  She is really not interested in any "procedures" if possible and would like to taper down and stop HRT if possible.  Advised she may start to have some symptoms and if she does, she will let me know as then another treatment option will be needed.  Pt very much in agreemtn with this plan.   Assessment: Perimenopausal bleeding by recent lab work  Plan: Decreased estradiol to 0.25mg  now and continue through Aug 15th. She will decrease the progesterone in 1/2 as well days 1-15 this month and then she will let me know about her bleeding.  If less than most recent cycle, will then plan to stop and see if she has any symptoms and if bleeding fully resolves.  If not, will proceed with hysteroscopy.   ~20 minutes spent with patient >50% of time was in face to face discussion of above.

## 2016-11-14 ENCOUNTER — Encounter: Payer: Self-pay | Admitting: Obstetrics & Gynecology

## 2016-12-10 NOTE — Progress Notes (Signed)
[facility]  PATIENT: Erica Daugherty DOB: [birth year]   REASON FOR VISIT: Follow-up for peripheral neuropathy, worsening sx HISTORY FROM: Patient    HISTORY OF PRESENT ILLNESS: HISTORY:Erica Daugherty is a 56 years old right-handed Caucasian female, referred by her primary care physician Dr. Unice Cobble, and pain management Dr. 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 She complains of 6 months history of bilateral feet paresthesia, at plantar surface, pad, numbness, burning sensation, needle prick, She denies weakness, getting sore after weight bearing. She does have low back pain, going down her legs. She denies gait difficulty, no fingers paresthesia, no incontinence, She has brought the laboratory in December 2014, which has demonstrates normal CMP, CBC, TSH, LDL was elevated 152 UPDATE July 9th 2015:She is taking Gabapentin 100mg  bid, which does help her symptoms, Labs showed normal F75, folic acid, RPR, mild elevated A1c 5.7, normal electrophoresis.Previous electrodiagnostic study dose demonstrate mild axonal peripheral neuropathy, she complains bilateral feet swelling, ankle achiness pain, difficulty walking because of ankle discomfort UPDATE: April 16, 2015: Erica Daugherty, 56 year old female returns for yearly follow-up.  She has a history of mild axonal peripheral neuropathy, she is unable to tolerate gabapentin during the day due to drowsiness. She gets no regular exercise. She has not had any falls.. She continues to have some numbness of the feet. She returns for reevaluation. She needs refills on her medications. UPDATE 12/07/2017[name], 56 year old female returns for yearly follow-up. She has a history of mild axonal peripheral neuropathy  but is unable to tolerate gabapentin during the day due to drowsiness. She continues to have some numbness of the feet. She gets no regular exercise. She has not had any falls or balance issues. She returns for reevaluation. She needs refills UPDATE 12/11/16 [name], 56 year old female returns for follow-up with a history of mild axonal peripheral neuropathy. She is currently on gabapentin at night only she has been unable to tolerate gabapentin during the day due to daytime drowsiness.Her symptoms are worse with  tingling sensation and burning  on the plantar surface of her feet.  She does little exercise. 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 She has had no falls or balance issues. She returns for reevaluation REVIEW OF SYSTEMS: Full 14 system review of systems performed and notable only for those listed, all others are neg:  Constitutional: neg  Cardiovascular: leg swelling Ear/Nose/Throat: neg  Skin: neg Eyes: neg Respiratory: neg Gastroitestinal: neg  Hematology/Lymphatic: neg  Endocrine: neg Musculoskeletal:neg Allergy/Immunology: neg Neurological: Neuropathy Psychiatric: neg Sleep : neg   ALLERGIES: No Known Allergies  HOME MEDICATIONS: Outpatient Medications Prior to Visit  Medication Sig Dispense Refill  . Cholecalciferol (VITAMIN D PO) Take 5,000 Int'l Units/day by mouth.    . estradiol (ESTRACE) 0.5 MG tablet Take 1 tablet (0.5 mg total) by mouth daily. (Patient taking differently: Take 0.25 mg by mouth daily. ) 90 tablet 4  . gabapentin (NEURONTIN) 300 MG capsule TAKE 1 CAPSULE AT BEDTIME. 31 capsule 11  . medroxyPROGESTERone (PROVERA) 5 MG tablet Take 1 tablet (5 mg total) by mouth daily. (Patient taking differently: Take 2.5 mg by mouth daily. ) 90 tablet 4  . oxybutynin (DITROPAN-XL) 5 MG 24 hr tablet 5 mg daily.    Marland Kitchen triamterene-hydrochlorothiazide (MAXZIDE-25) 37.5-25 MG tablet Take  1 tablet by mouth daily.     No facility-administered medications prior to  visit.     PAST MEDICAL HISTORY: Past Medical History:  Diagnosis Date  . History of decreased platelet count   . Hyperlipidemia     PAST SURGICAL HISTORY: Past Surgical History:  Procedure Laterality Date  . EYE SURGERY  1964   lazy eye  . no colonoscopy     03/26/13  & 04/28/14 SOC reviewed  . SHOULDER SURGERY  2012   Left ; Dr Onnie Graham    FAMILY HISTORY: Family History  Problem Relation Age of Onset  . Diabetes Mother   . Hyperlipidemia Mother   . Hypertension Mother   . Thyroid nodules Mother   . Testicular cancer Brother 35  . Diabetes Brother   . Prostate cancer Father 15  . Stroke Neg Hx   . Heart disease Neg Hx   . Colon cancer Neg Hx   . Stomach cancer Neg Hx     SOCIAL HISTORY: Social History   Social History  . Marital status: Married    Spouse name: [name]  . Number of children: 4  . Years of education: college   Occupational History  . PROFESSIONAL PHOTOGRAPHER Self Employed   Social History Main Topics  . Smoking status: Never Smoker  . Smokeless tobacco: Never Used  . Alcohol use 0.6 oz/week    1 Glasses of wine per week     Comment: very rarely  . Drug use: No  . Sexual activity: Yes    Partners: Male    Birth control/ protection: Post-menopausal, Surgical     Comment: vasectomy   Other Topics Concern  . Not on file   Social History Narrative   Patient lives at home with her husband [name]). Patient works full time.   Education college   Right handed.   Caffeine sometimes one cup of sweet tea.     PHYSICAL EXAM  Vitals:   12/11/16 1246  BP: 132/85  Pulse: 88  Weight: 242 lb 6.4 oz (110 kg)  Height: 5' 6.25" (1.683 m)   Body mass index is 38.83 kg/m. Generalized: In no acute distress, obese female, well-groomed Neck: Supple, no carotid bruits  Cardiac: Regular rate rhythm Pulmonary: Clear to auscultation bilaterally Musculoskeletal: No deformity Skin 2+ edema in the feet and legs  Neurological examination Mentation:  Alert oriented to time, place, history taking, and causual conversation Cranial nerve II-XII: Pupils were equal round reactive to light. Extraocular movements were full. Visual field were full on confrontational test. Facial sensation and strength were normal. Hearing was intact to finger rubbing bilaterally. Uvula tongue midline. Head turning and shoulder shrug and were normal and symmetric.Tongue protrusion into cheek strength was normal. Motor: Normal tone, bulk and strength. No focal weakness Sensory: Length dependent Intact to fine touch, pinprick, at midshin level, absent vibratory sensation to ankle level Coordination: Normal finger to nose, heel-to-shin bilaterally there was no truncal ataxia Gait: Rising up from seated position without assistance, normal stance, without trunk ataxia, moderate stride, good arm swing, smooth turning, able to perform tiptoe, and heel walking without difficulty. Tandem gait is normal Deep tendon reflexes: Brachioradialis 2/2, biceps 2/2, triceps 2/2, patellar 2/2, Achilles trace, plantar responses were flexor bilaterally. DIAGNOSTIC DATA (LABS, IMAGING, TESTING) - I reviewed patient records, labs, notes, testing and imaging myself where available.  Lab Results  Component Value Date   WBC 9.2 10/09/2015   HGB 14.3 10/09/2015   HCT 42.4 10/09/2015  MCV 88.3 10/09/2015   PLT 207 10/09/2015      Component Value Date/Time   NA 139 10/09/2015 1527   K 4.6 10/09/2015 1527   CL 103 10/09/2015 1527   CO2 26 10/09/2015 1527   GLUCOSE 80 10/09/2015 1527   BUN 16 10/09/2015 1527   CREATININE 1.02 10/09/2015 1527   CALCIUM 9.2 10/09/2015 1527   PROT 6.6 10/09/2015 1527   PROT 6.6 10/12/2013 1126   ALBUMIN 3.9 10/09/2015 1527   AST 15 10/09/2015 1527   ALT 15 10/09/2015 1527   ALKPHOS 47 10/09/2015 1527   BILITOT 0.5 10/09/2015 1527   GFRNONAA 63 06/02/2008 1031   GFRAA 76 06/02/2008 1031   Lab Results  Component Value Date   CHOL 162 01/23/2016    HDL 32 (A) 01/23/2016   LDLCALC 119 01/23/2016   LDLDIRECT 157.2 03/26/2013   TRIG 53 01/23/2016   CHOLHDL 3.8 10/09/2015   Lab Results  Component Value Date   HGBA1C 5.5 10/09/2015    Lab Results  Component Value Date   TSH 3.59 01/23/2016      ASSESSMENT AND PLAN  56 y.o. year old female  has a past medical history of mild axonal peripheral neuropathy. She is also noted to have 2+ edema of the feet and tells me she has seen a vein specialist in the past who told her she had incompetent valves.  Increase  Gabapentin to 400mg   will refill Suggested B complex vitamin Suggest she return to the vein specialist who has seen her in the past for her leg swelling F/U in one year Dennie Bible, [facility], [facility], [facility] [address], [place], [place] [address] [phone]

## 2016-12-11 ENCOUNTER — Ambulatory Visit (INDEPENDENT_AMBULATORY_CARE_PROVIDER_SITE_OTHER): Payer: 59 | Admitting: Nurse Practitioner

## 2016-12-11 ENCOUNTER — Encounter: Payer: Self-pay | Admitting: Nurse Practitioner

## 2016-12-11 VITALS — BP 132/85 | HR 88 | Ht 66.25 in | Wt 242.4 lb

## 2016-12-11 DIAGNOSIS — G629 Polyneuropathy, unspecified: Secondary | ICD-10-CM | POA: Diagnosis not present

## 2016-12-11 MED ORDER — GABAPENTIN 400 MG PO CAPS: 400.0000 mg | ORAL_CAPSULE | Freq: Every day | ORAL | 3 refills | Status: DC

## 2016-12-11 NOTE — Patient Instructions (Signed)
Increase  Gabapentin to 400mg   will refill Suggest she return to the vein specialist who has seen her in the past for her leg swelling F/U in one year

## 2016-12-11 NOTE — Progress Notes (Signed)
I have reviewed and agreed above plan. 

## 2016-12-12 ENCOUNTER — Telehealth: Payer: Self-pay | Admitting: Nurse Practitioner

## 2016-12-12 DIAGNOSIS — M7989 Other specified soft tissue disorders: Secondary | ICD-10-CM

## 2016-12-12 NOTE — Telephone Encounter (Signed)
I dont know who she has seen in the past. Her PCP would have that information because we did not make the referral previously

## 2016-12-12 NOTE — Telephone Encounter (Signed)
I called pt and had to LM for her ? Who is the vein /vascular surgeon she had see before?  Ok to do or pcp?

## 2016-12-12 NOTE — Telephone Encounter (Signed)
Pt states that she was advised by NP Hoyle Sauer to go back to her vein Dr.  Abbott Pao said that it has been so long that she has seen that Dr a referral is needed.  Pt would like to know if Hoyle Sauer can send one for her, please call

## 2016-12-13 NOTE — Telephone Encounter (Addendum)
I spoke with pt and she stated she had seen Dr. Gae Gallop in the past ,this being 8-10 yrs ago at [facility].   Is ok to see other MD.  Would like for [place] to make referral please since just seen recently.

## 2016-12-16 NOTE — Addendum Note (Signed)
Addended by: Brandon Melnick on: 12/16/2016 04:20 PM   Modules accepted: Orders

## 2016-12-16 NOTE — Telephone Encounter (Signed)
Ok to make referral , lower ext swelling previously seen by them

## 2016-12-16 NOTE — Telephone Encounter (Signed)
Done

## 2016-12-24 ENCOUNTER — Other Ambulatory Visit: Payer: Self-pay

## 2016-12-24 DIAGNOSIS — M7989 Other specified soft tissue disorders: Secondary | ICD-10-CM

## 2016-12-27 ENCOUNTER — Telehealth: Payer: Self-pay | Admitting: Obstetrics & Gynecology

## 2016-12-27 NOTE — Telephone Encounter (Signed)
Patient called requesting to speak with a nurse to give an update on a medication she is taking.

## 2016-12-27 NOTE — Telephone Encounter (Signed)
Spoke with patient and she states calling with medication update. She decreased Estradiol to 0.25mg  and decreased Progesterone to 2.5mg  from last office visit through 12-04-16. She then stopped both medications. She did well during this time with no bleeding. She only had slight spotting 1 or 2 days. She restarted both meds on 12-22-16(she had been out of town) and wants to make sure that was how Dr.Miller wanted her to take this. Estradiol 0.25mg  daily and Progesterone 2.5mg  days 1-15. Routing to [place]

## 2016-12-27 NOTE — Telephone Encounter (Signed)
Yes she is doing this correctly, estradiol 0.25mg  daily and progesterone 2.5mg  days 1-15.  I want to know if she has future bleeding.  Thanks.  Also, I hope she has a nice trip to [facility]

## 2016-12-30 NOTE — Telephone Encounter (Signed)
Spoke with patient, advised as seen below per Dr. Sabra Heck. Patient verbalizes understanding and is agreeable.  Patient is agreeable to disposition. Will close encounter.

## 2017-01-27 DIAGNOSIS — Z0000 Encounter for general adult medical examination without abnormal findings: Secondary | ICD-10-CM | POA: Diagnosis not present

## 2017-01-29 DIAGNOSIS — Z23 Encounter for immunization: Secondary | ICD-10-CM | POA: Diagnosis not present

## 2017-01-29 DIAGNOSIS — Z0000 Encounter for general adult medical examination without abnormal findings: Secondary | ICD-10-CM | POA: Diagnosis not present

## 2017-02-04 ENCOUNTER — Ambulatory Visit (INDEPENDENT_AMBULATORY_CARE_PROVIDER_SITE_OTHER): Payer: 59 | Admitting: Vascular Surgery

## 2017-02-04 ENCOUNTER — Ambulatory Visit (HOSPITAL_COMMUNITY)
Admission: RE | Admit: 2017-02-04 | Discharge: 2017-02-04 | Disposition: A | Discharge status: Home / Self Care | Payer: 59 | Source: Ambulatory Visit | Attending: Vascular Surgery | Admitting: Vascular Surgery

## 2017-02-04 ENCOUNTER — Encounter: Payer: Self-pay | Admitting: Vascular Surgery

## 2017-02-04 VITALS — BP 133/88 | HR 72 | Temp 99.4°F | Resp 16 | Ht 66.0 in | Wt 239.0 lb

## 2017-02-04 DIAGNOSIS — I890 Lymphedema, not elsewhere classified: Secondary | ICD-10-CM | POA: Diagnosis not present

## 2017-02-04 DIAGNOSIS — I8393 Asymptomatic varicose veins of bilateral lower extremities: Secondary | ICD-10-CM | POA: Insufficient documentation

## 2017-02-04 DIAGNOSIS — M7989 Other specified soft tissue disorders: Secondary | ICD-10-CM | POA: Insufficient documentation

## 2017-02-04 NOTE — Progress Notes (Signed)
[facility] [place]  Patient name: Erica Daugherty MRN: [id number] DOB: 16-Feb-1961 Sex: female  REASON FOR CONSULT: Evaluation of lower extremity swelling  HPI: Erica Daugherty is a 56 y.o. female, who is here today for evaluation of lower extremity swelling.  She reports this is been present for many years and has been slowly progressive.  She reports that she has minimal change with elevation and has tried diet and salt restriction and has had no treatments that helped with this.  She also is worn the compression garments in the past and did not note any significant reduction in her swelling.  No history of DVT.  He has no skin ulceration.  No history of lower externally arterial insufficiency  Past Medical History:  Diagnosis Date  . History of decreased platelet count   . Hyperlipidemia     Family History  Problem Relation Age of Onset  . Diabetes Mother   . Hyperlipidemia Mother   . Hypertension Mother   . Thyroid nodules Mother   . Testicular cancer Brother 9  . Diabetes Brother   . Prostate cancer Father 34  . Stroke Neg Hx   . Heart disease Neg Hx   . Colon cancer Neg Hx   . Stomach cancer Neg Hx     SOCIAL HISTORY: Social History   Social History  . Marital status: Married    Spouse name: Erica Daugherty  . Number of children: 4  . Years of education: college   Occupational History  . PROFESSIONAL PHOTOGRAPHER Self Employed   Social History Main Topics  . Smoking status: Never Smoker  . Smokeless tobacco: Never Used  . Alcohol use 0.6 oz/week    1 Glasses of wine per week     Comment: very rarely  . Drug use: No  . Sexual activity: Yes    Partners: Male    Birth control/ protection: Post-menopausal, Surgical     Comment: vasectomy   Other Topics Concern  . Not on file   Social History Narrative   Patient lives at home with her husband Erica Daugherty). Patient works full time.   Education college   Right  handed.   Caffeine sometimes one cup of sweet tea.    No Known Allergies  Current Outpatient Prescriptions  Medication Sig Dispense Refill  . Cholecalciferol (VITAMIN D PO) Take 5,000 Int'l Units/day by mouth.    . Cyanocobalamin 2500 MCG TABS Take by mouth.    . estradiol (ESTRACE) 0.5 MG tablet Take 1 tablet (0.5 mg total) by mouth daily. (Patient taking differently: Take 0.25 mg by mouth daily. Take 1/2 tab) 90 tablet 4  . gabapentin (NEURONTIN) 400 MG capsule Take 1 capsule (400 mg total) by mouth at bedtime. 90 capsule 3  . medroxyPROGESTERone (PROVERA) 5 MG tablet Take 1 tablet (5 mg total) by mouth daily. (Patient taking differently: Take 2.5 mg by mouth daily. ) 90 tablet 4  . oxybutynin (DITROPAN-XL) 5 MG 24 hr tablet 5 mg daily.    Marland Kitchen triamterene-hydrochlorothiazide (MAXZIDE-25) 37.5-25 MG tablet Take 1 tablet by mouth daily.     No current facility-administered medications for this visit.     REVIEW OF SYSTEMS:  [X]  denotes positive finding, [ ]  denotes negative finding Cardiac  Comments:  Chest pain or chest pressure:    Shortness of breath upon exertion:    Short of breath when lying flat:    Irregular heart rhythm:        Vascular  Pain in calf, thigh, or hip brought on by ambulation:    Pain in feet at night that wakes you up from your sleep:     Blood clot in your veins:    Leg swelling:  x       Pulmonary    Oxygen at home:    Productive cough:     Wheezing:         Neurologic    Sudden weakness in arms or legs:     Sudden numbness in arms or legs:     Sudden onset of difficulty speaking or slurred speech:    Temporary loss of vision in one eye:     Problems with dizziness:         Gastrointestinal    Blood in stool:     Vomited blood:         Genitourinary    Burning when urinating:     Blood in urine:        Psychiatric    Major depression:         Hematologic    Bleeding problems:    Problems with blood clotting too easily:        Skin     Rashes or ulcers:        Constitutional    Fever or chills:      PHYSICAL EXAM: Vitals:   02/04/17 1148  BP: 133/88  Pulse: 72  Resp: 16  Temp: 99.4 F (37.4 C)  TempSrc: Oral  SpO2: 99%  Weight: 239 lb (108.4 kg)  Height: 5\' 6"  (1.676 m)    GENERAL: The patient is a well-nourished female, in no acute distress. The vital signs are documented above. CARDIOVASCULAR: 2+ radial and 2+ dorsalis pedis pulse PULMONARY: There is good air exchange  ABDOMEN: Soft and non-tender  MUSCULOSKELETAL: There are no major deformities or cyanosis. NEUROLOGIC: No focal weakness or paresthesias are detected. SKIN: Does have swelling in both legs from mid calf down onto her ankles and feet and also slightly onto her toes.  Has thickness on the medial aspect more so on the left leg but also on the on the distal calf above the ankle PSYCHIATRIC: The patient has a normal affect.  DATA:  Noninvasive studies today revealed no evidence of DVT or obstruction.  She had mild reflux in her deep vein and superficial system marked dilatation of her superficial veins  MEDICAL ISSUES: Had long discussion with the patient.  Explained that that she has normal arterial and minimally abnormal venous exam.  I did explain that her findings and history are consistent with primary lymphedema.  Splint this is very difficult to treat and is really treated only with conservative treatment with elevation and being very compliant with lifelong compression.  Also explained the option of therapeutic massage with a licensed lymphedema therapist.  I have recommended that we refer her to 1 of these and she has accepted this recommendation.  We will see her again on an as needed   Rosetta Posner, MD [facility] Tel [phone] Pager [phone]

## 2017-02-10 DIAGNOSIS — I890 Lymphedema, not elsewhere classified: Secondary | ICD-10-CM | POA: Diagnosis not present

## 2017-03-04 DIAGNOSIS — R600 Localized edema: Secondary | ICD-10-CM | POA: Diagnosis not present

## 2017-03-04 DIAGNOSIS — I10 Essential (primary) hypertension: Secondary | ICD-10-CM | POA: Diagnosis not present

## 2017-03-06 DIAGNOSIS — I10 Essential (primary) hypertension: Secondary | ICD-10-CM | POA: Diagnosis not present

## 2017-03-06 DIAGNOSIS — R600 Localized edema: Secondary | ICD-10-CM | POA: Diagnosis not present

## 2017-03-19 DIAGNOSIS — I10 Essential (primary) hypertension: Secondary | ICD-10-CM | POA: Diagnosis not present

## 2017-03-19 DIAGNOSIS — R600 Localized edema: Secondary | ICD-10-CM | POA: Diagnosis not present

## 2017-04-01 ENCOUNTER — Ambulatory Visit: Payer: 59 | Admitting: Nurse Practitioner

## 2017-04-17 DIAGNOSIS — D2272 Melanocytic nevi of left lower limb, including hip: Secondary | ICD-10-CM | POA: Diagnosis not present

## 2017-04-17 DIAGNOSIS — D2271 Melanocytic nevi of right lower limb, including hip: Secondary | ICD-10-CM | POA: Diagnosis not present

## 2017-04-17 DIAGNOSIS — L821 Other seborrheic keratosis: Secondary | ICD-10-CM | POA: Diagnosis not present

## 2017-04-29 DIAGNOSIS — H6123 Impacted cerumen, bilateral: Secondary | ICD-10-CM | POA: Diagnosis not present

## 2017-04-29 DIAGNOSIS — R609 Edema, unspecified: Secondary | ICD-10-CM | POA: Diagnosis not present

## 2017-04-29 DIAGNOSIS — I10 Essential (primary) hypertension: Secondary | ICD-10-CM | POA: Diagnosis not present

## 2017-04-29 DIAGNOSIS — R42 Dizziness and giddiness: Secondary | ICD-10-CM | POA: Diagnosis not present

## 2017-05-21 DIAGNOSIS — R42 Dizziness and giddiness: Secondary | ICD-10-CM | POA: Diagnosis not present

## 2017-05-21 DIAGNOSIS — R39198 Other difficulties with micturition: Secondary | ICD-10-CM | POA: Diagnosis not present

## 2017-05-21 DIAGNOSIS — I10 Essential (primary) hypertension: Secondary | ICD-10-CM | POA: Diagnosis not present

## 2017-07-28 DIAGNOSIS — H1045 Other chronic allergic conjunctivitis: Secondary | ICD-10-CM | POA: Diagnosis not present

## 2017-08-20 DIAGNOSIS — R609 Edema, unspecified: Secondary | ICD-10-CM | POA: Diagnosis not present

## 2017-08-20 DIAGNOSIS — I10 Essential (primary) hypertension: Secondary | ICD-10-CM | POA: Diagnosis not present

## 2017-09-30 DIAGNOSIS — I10 Essential (primary) hypertension: Secondary | ICD-10-CM | POA: Diagnosis not present

## 2017-09-30 DIAGNOSIS — R609 Edema, unspecified: Secondary | ICD-10-CM | POA: Diagnosis not present

## 2017-10-10 ENCOUNTER — Ambulatory Visit: Payer: 59 | Admitting: Nurse Practitioner

## 2017-10-20 ENCOUNTER — Other Ambulatory Visit: Payer: Self-pay

## 2017-10-20 ENCOUNTER — Ambulatory Visit (INDEPENDENT_AMBULATORY_CARE_PROVIDER_SITE_OTHER): Payer: 59 | Admitting: Obstetrics & Gynecology

## 2017-10-20 ENCOUNTER — Encounter: Payer: Self-pay | Admitting: Obstetrics & Gynecology

## 2017-10-20 VITALS — BP 106/62 | HR 70 | Resp 16 | Ht 66.5 in | Wt 242.0 lb

## 2017-10-20 DIAGNOSIS — R1011 Right upper quadrant pain: Secondary | ICD-10-CM | POA: Diagnosis not present

## 2017-10-20 DIAGNOSIS — N393 Stress incontinence (female) (male): Secondary | ICD-10-CM

## 2017-10-20 DIAGNOSIS — Z01419 Encounter for gynecological examination (general) (routine) without abnormal findings: Secondary | ICD-10-CM

## 2017-10-20 MED ORDER — MEDROXYPROGESTERONE ACETATE 2.5 MG PO TABS: ORAL_TABLET | ORAL | 4 refills | Status: DC

## 2017-10-20 MED ORDER — ESTRADIOL 0.5 MG PO TABS: ORAL_TABLET | ORAL | 4 refills | Status: DC

## 2017-10-20 NOTE — Progress Notes (Signed)
57 y.o. [id number] MarriedCaucasianF here for annual exam.  Denies vaginal bleeding.  Doing well.   Having more incontinence issue.  Having a lot more issues with exercise.  Leaks with laugh and cough.  Has been on ditropan.  Has dry mouth with this so has trouble taking it really regularly.    Also, reports discomfort in RUQ.  At times feels like there is movement in this location as well.  PCP:  Dr. Ernie Hew.    Patient's last menstrual period was 10/07/2016 (exact date).          Sexually active: Yes.    The current method of family planning is post menopausal status & husband vasectomy.    Exercising: No.  exercise Smoker:  no  Health Maintenance: Pap:  09-30-13 neg HPV HR neg, 10-08-16 neg HPV HR neg History of abnormal Pap:  no MMG:  02-02-16 category b density birads 2;neg.  Pt is aware this is due.  States she will schedule it   Colonoscopy:  01/17/15 tubular adenoma, f/u 64yrs BMD: none TDaP:  2016 Pneumonia vaccine(s):  Not done Shingrix:   Not done. Hep C testing: both neg 2017 Screening Labs: done with Dr. Ernie Hew   reports that she has never smoked. She has never used smokeless tobacco. She reports that she drank alcohol. She reports that she does not use drugs.  Past Medical History:  Diagnosis Date  . History of decreased platelet count   . Hyperlipidemia     Past Surgical History:  Procedure Laterality Date  . EYE SURGERY  1964   lazy eye  . no colonoscopy     03/26/13  & 04/28/14 SOC reviewed  . SHOULDER SURGERY  2012   Left ; Dr Onnie Graham    Current Outpatient Medications  Medication Sig Dispense Refill  . chlorthalidone (HYGROTON) 25 MG tablet     . Cholecalciferol (VITAMIN D PO) Take 5,000 Int'l Units/day by mouth.    . Cyanocobalamin 2500 MCG TABS Take by mouth.    . estradiol (ESTRACE) 0.5 MG tablet Take 1 tablet (0.5 mg total) by mouth daily. (Patient taking differently: Take 0.25 mg by mouth. Take 1/2 tab days 1-15) 90 tablet 4  . gabapentin (NEURONTIN) 400 MG  capsule Take 1 capsule (400 mg total) by mouth at bedtime. 90 capsule 3  . medroxyPROGESTERone (PROVERA) 5 MG tablet Take 1 tablet (5 mg total) by mouth daily. (Patient taking differently: Take 2.5 mg by mouth daily. ) 90 tablet 4  . olmesartan (BENICAR) 20 MG tablet     . oxybutynin (DITROPAN-XL) 5 MG 24 hr tablet 5 mg daily.     No current facility-administered medications for this visit.     Family History  Problem Relation Age of Onset  . Diabetes Mother   . Hyperlipidemia Mother   . Hypertension Mother   . Thyroid nodules Mother   . Testicular cancer Brother 46  . Diabetes Brother   . Prostate cancer Father 18  . Stroke Neg Hx   . Heart disease Neg Hx   . Colon cancer Neg Hx   . Stomach cancer Neg Hx     Review of Systems  Genitourinary:       Urinary leaking with cough, laugh, sneeze  All other systems reviewed and are negative.   Exam:   BP 106/62   Pulse 70   Resp 16   Ht 5' 6.5" (1.689 m)   Wt 242 lb (109.8 kg)   LMP 10/07/2016 (Exact Date)  BMI 38.47 kg/m   Height:   Height: 5' 6.5" (168.9 cm)  Ht Readings from Last 3 Encounters:  10/20/17 5' 6.5" (1.689 m)  02/04/17 5\' 6"  (1.676 m)  12/11/16 5' 6.25" (1.683 m)    General appearance: alert, cooperative and appears stated age Head: Normocephalic, without obvious abnormality, atraumatic Neck: no adenopathy, supple, symmetrical, trachea midline and thyroid normal to inspection and palpation Lungs: clear to auscultation bilaterally Breasts: normal appearance, no masses or tenderness Heart: regular rate and rhythm Abdomen: soft, non-tender; bowel sounds normal; no masses,  no organomegaly Extremities: extremities normal, atraumatic, no cyanosis or edema Skin: Skin color, texture, turgor normal. No rashes or lesions Lymph nodes: Cervical, supraclavicular, and axillary nodes normal. No abnormal inguinal nodes palpated Neurologic: Grossly normal   Pelvic: External genitalia:  no lesions               Urethra:  normal appearing urethra with no masses, tenderness or lesions              Bartholins and Skenes: normal                 Vagina: normal appearing vagina with normal color and discharge, no lesions, second degree cystocele              Cervix: no lesions              Pap taken: No. Bimanual Exam:  Uterus:  normal size, contour, position, consistency, mobility, non-tender              Adnexa: normal adnexa and no mass, fullness, tenderness               Rectovaginal: Confirms               Anus:  normal sphincter tone, no lesions  Chaperone was present for exam.  A:  Well Woman with normal exam PMP, on HRT Stress urinary incontinence with 2nd degree cystocele Hypertension  P:   Mammogram guidelines reviewed.  Aware this is due.  States she will schedule this. pap smear not indicated RF for estradiol 0.5mg  1/2 tab daily and provera 2.40md days 1-15 each month.   Referral to Dr. Matilde Sprang for possibly surgical correction of SUI RUQ will be scheduled Blood work UTD with Dr. Ernie Hew D/w pt Shingrix vaccination return annually or prn

## 2017-10-20 NOTE — Addendum Note (Signed)
Addended by: Burnice Logan on: 10/20/2017 02:44 PM   Modules accepted: Orders

## 2017-10-20 NOTE — Progress Notes (Addendum)
Patient scheduled while in office for abdominal US RUQ. Spoke with Erica Daugherty at [place], scheduled for 11/03/17 arriving at 8:40am for 9am appt at [address] location. Patient declined earlier appointments offered, will be out of town 7/8 - 7/12. Nothing to eat or drink after midnight. Patient verbalizes understanding and is agreeable.

## 2017-11-03 ENCOUNTER — Ambulatory Visit
Admission: RE | Admit: 2017-11-03 | Discharge: 2017-11-03 | Disposition: A | Discharge status: Home / Self Care | Payer: 59 | Source: Ambulatory Visit | Attending: Obstetrics & Gynecology | Admitting: Obstetrics & Gynecology

## 2017-11-03 DIAGNOSIS — R1011 Right upper quadrant pain: Secondary | ICD-10-CM

## 2017-11-03 DIAGNOSIS — K8020 Calculus of gallbladder without cholecystitis without obstruction: Secondary | ICD-10-CM | POA: Diagnosis not present

## 2017-11-04 ENCOUNTER — Other Ambulatory Visit: Payer: Self-pay | Admitting: *Deleted

## 2017-11-04 ENCOUNTER — Telehealth: Payer: Self-pay | Admitting: Obstetrics & Gynecology

## 2017-11-04 DIAGNOSIS — K8020 Calculus of gallbladder without cholecystitis without obstruction: Secondary | ICD-10-CM

## 2017-11-04 NOTE — Telephone Encounter (Signed)
Patient is asking to talk with Sharee Pimple, states that she is retuning a call to [place]. No open phone note?

## 2017-11-04 NOTE — Telephone Encounter (Signed)
Spoke with patient. Patient calling to confirm mobile number is listed as primary contact. Advised home and mobile on file, mobile as preferred contact. Advised will update referral coordinator.   Rosa, please use mobile number for contact for patients referrals.   Encounter closed.

## 2017-11-07 ENCOUNTER — Telehealth: Payer: Self-pay | Admitting: Obstetrics & Gynecology

## 2017-11-07 NOTE — Telephone Encounter (Signed)
Patient returning call to [facility]. Ph: [phone]

## 2017-11-17 ENCOUNTER — Other Ambulatory Visit: Payer: Self-pay | Admitting: General Surgery

## 2017-11-17 DIAGNOSIS — I10 Essential (primary) hypertension: Secondary | ICD-10-CM | POA: Diagnosis not present

## 2017-11-17 DIAGNOSIS — K8020 Calculus of gallbladder without cholecystitis without obstruction: Secondary | ICD-10-CM | POA: Diagnosis not present

## 2017-12-02 DIAGNOSIS — R8271 Bacteriuria: Secondary | ICD-10-CM | POA: Diagnosis not present

## 2017-12-02 DIAGNOSIS — R350 Frequency of micturition: Secondary | ICD-10-CM | POA: Diagnosis not present

## 2017-12-02 NOTE — Pre-Procedure Instructions (Signed)
KYASIA STEUCK  [birth year]      [address], [place] [address] Phone: [phone] Fax: [place] [address], [place] [address] Phone: [phone] Fax: [phone]    Your procedure is scheduled on Fri., Aug. 23, 2019 from 10:00AM-11:35AM  Report to [facility] Admitting Entrance "A" at 8:00AM  Call this number if you have problems the morning of surgery:  [phone]   Remember:  Do not eat after midnight on Aug. 22nd  You may drink clear liquids until 3 hours (7:00AM).  Clear liquids allowed are:                    Water and Gatorade    Take these medicines the morning of surgery with A SIP OF WATER: Estradiol (ESTRACE)   7 days before surgery (8/16), stop taking all Other Aspirin Products, Vitamins, Fish oils, and Herbal medications. Also stop all NSAIDS i.e. Advil, Ibuprofen, Motrin, Aleve, Anaprox, Naproxen, BC, Goody Powders, and all Supplements.    Do not wear jewelry, make-up or nail polish.  Do not wear lotions, powders, or perfumes, or deodorant.  Do not shave 48 hours prior to surgery.    Do not bring valuables to the hospital.  [facility] is not responsible for any belongings or valuables.  Contacts, dentures or bridgework may not be worn into surgery.  Leave your suitcase in the car.  After surgery it may be brought to your room.  For patients admitted to the hospital, discharge time will be determined by your treatment team.  Patients discharged the day of surgery will not be allowed to drive home.   Special instructions:   [place]- Preparing For Surgery  Before surgery, you can play an important role. Because skin is not sterile, your skin needs to be as free of germs as possible. You can reduce the number of germs on your skin by washing with  CHG (chlorahexidine gluconate) Soap before surgery.  CHG is an antiseptic cleaner which kills germs and bonds with the skin to continue killing germs even after washing.    Oral Hygiene is also important to reduce your risk of infection.  Remember - BRUSH YOUR TEETH THE MORNING OF SURGERY WITH YOUR REGULAR TOOTHPASTE  Please do not use if you have an allergy to CHG or antibacterial soaps. If your skin becomes reddened/irritated stop using the CHG.  Do not shave (including legs and underarms) for at least 48 hours prior to first CHG shower. It is OK to shave your face.  Please follow these instructions carefully.   1. Shower the NIGHT BEFORE SURGERY and the MORNING OF SURGERY with CHG.   2. If you chose to wash your hair, wash your hair first as usual with your normal shampoo.  3. After you shampoo, rinse your hair and body thoroughly to remove the shampoo.  4. Use CHG as you would any other liquid soap. You can apply CHG directly to the skin and wash gently with a scrungie or a clean washcloth.   5. Apply the CHG Soap to your body ONLY FROM THE NECK DOWN.  Do not use on open wounds or open sores. Avoid contact with your eyes, ears, mouth and genitals (private parts). Wash Face and genitals (private parts)  with  your normal soap.  6. Wash thoroughly, paying special attention to the area where your surgery will be performed.  7. Thoroughly rinse your body with warm water from the neck down.  8. DO NOT shower/wash with your normal soap after using and rinsing off the CHG Soap.  9. Pat yourself dry with a CLEAN TOWEL.  10. Wear CLEAN PAJAMAS to bed the night before surgery, wear comfortable clothes the morning of surgery  11. Place CLEAN SHEETS on your bed the night of your first shower and DO NOT SLEEP WITH PETS.  Day of Surgery:  Do not apply any deodorants/lotions.  Please wear clean clothes to the hospital/surgery center.   Remember to brush your teeth WITH YOUR REGULAR  TOOTHPASTE.  Please read over the following fact sheets that you were given. Pain Booklet, Coughing and Deep Breathing and Surgical Site Infection Prevention

## 2017-12-03 ENCOUNTER — Encounter (HOSPITAL_COMMUNITY)
Admission: RE | Admit: 2017-12-03 | Discharge: 2017-12-03 | Disposition: A | Discharge status: Home / Self Care | Payer: 59 | Source: Ambulatory Visit | Attending: General Surgery | Admitting: General Surgery

## 2017-12-03 ENCOUNTER — Other Ambulatory Visit: Payer: Self-pay

## 2017-12-03 ENCOUNTER — Encounter (HOSPITAL_COMMUNITY): Payer: Self-pay

## 2017-12-03 DIAGNOSIS — Z01810 Encounter for preprocedural cardiovascular examination: Secondary | ICD-10-CM | POA: Diagnosis not present

## 2017-12-03 DIAGNOSIS — Z01812 Encounter for preprocedural laboratory examination: Secondary | ICD-10-CM | POA: Diagnosis not present

## 2017-12-03 HISTORY — DX: Essential (primary) hypertension: I10

## 2017-12-03 HISTORY — DX: Calculus of gallbladder without cholecystitis without obstruction: K80.20

## 2017-12-03 HISTORY — DX: Umbilical hernia without obstruction or gangrene: K42.9

## 2017-12-03 LAB — CBC WITH DIFFERENTIAL/PLATELET
ABS IMMATURE GRANULOCYTES: 0 10*3/uL (ref 0.0–0.1)
Basophils Absolute: 0.1 10*3/uL (ref 0.0–0.1)
Basophils Relative: 1 %
EOS ABS: 0.3 10*3/uL (ref 0.0–0.7)
Eosinophils Relative: 4 %
HEMATOCRIT: 39 % (ref 36.0–46.0)
Hemoglobin: 12.7 g/dL (ref 12.0–15.0)
IMMATURE GRANULOCYTES: 0 %
LYMPHS ABS: 2 10*3/uL (ref 0.7–4.0)
Lymphocytes Relative: 24 %
MCH: 30.2 pg (ref 26.0–34.0)
MCHC: 32.6 g/dL (ref 30.0–36.0)
MCV: 92.9 fL (ref 78.0–100.0)
MONOS PCT: 10 %
Monocytes Absolute: 0.8 10*3/uL (ref 0.1–1.0)
NEUTROS PCT: 61 %
Neutro Abs: 5.1 10*3/uL (ref 1.7–7.7)
Platelets: 306 10*3/uL (ref 150–400)
RBC: 4.2 MIL/uL (ref 3.87–5.11)
RDW: 12.9 % (ref 11.5–15.5)
WBC: 8.3 10*3/uL (ref 4.0–10.5)

## 2017-12-03 LAB — COMPREHENSIVE METABOLIC PANEL
ALK PHOS: 49 U/L (ref 38–126)
ALT: 22 U/L (ref 0–44)
AST: 23 U/L (ref 15–41)
Albumin: 3.6 g/dL (ref 3.5–5.0)
Anion gap: 7 (ref 5–15)
BILIRUBIN TOTAL: 0.8 mg/dL (ref 0.3–1.2)
BUN: 15 mg/dL (ref 6–20)
CO2: 31 mmol/L (ref 22–32)
CREATININE: 1.03 mg/dL — AB (ref 0.44–1.00)
Calcium: 9.3 mg/dL (ref 8.9–10.3)
Chloride: 102 mmol/L (ref 98–111)
GFR calc Af Amer: >60 mL/min (ref >60)
GFR, EST NON AFRICAN AMERICAN: 59 mL/min — AB (ref >60)
Glucose, Bld: 81 mg/dL (ref 70–99)
Potassium: 3.2 mmol/L — ABNORMAL LOW (ref 3.5–5.1)
Sodium: 140 mmol/L (ref 135–145)
TOTAL PROTEIN: 6.5 g/dL (ref 6.5–8.1)

## 2017-12-03 NOTE — Progress Notes (Signed)
PCP - Dr. Rachell Cipro  Cardiologist - Denies  Chest x-ray - Denies  EKG - 12/03/17  Stress Test - Denies  ECHO - Denies  Cardiac Cath - Denies  Sleep Study - Denies CPAP - None  LABS- 12/03/17: CBC w/D, CMP  ASA- Denies   Anesthesia- No  Pt denies having chest pain, sob, or fever at this time. All instructions explained to the pt, with a verbal understanding of the material. Pt agrees to go over the instructions while at home for a better understanding. The opportunity to ask questions was provided.

## 2017-12-04 ENCOUNTER — Telehealth: Payer: Self-pay | Admitting: *Deleted

## 2017-12-04 NOTE — Telephone Encounter (Signed)
-----   Message from Megan Salon, MD sent at 12/03/2017  5:58 AM EDT ----- Regarding: mammogram Pt did not have mammogram done at last visit.  Has not done it yet.  Can you please call and get this scheduled for her please?  She is on HRT and needs this done.  She does have gall bladder removal planned for 12/12/17 so had a lot going on but I still want her to have this done.  Thanks.  Vinnie Level

## 2017-12-04 NOTE — Telephone Encounter (Signed)
Called patient. Offer to schedule MMG for her. States she will call today to get MMG schedule.  Dr. Lestine Box.  Encounter closed.

## 2017-12-06 NOTE — H&P (Signed)
Erica Daugherty Location: [facility] Patient #: [id number] DOB: [birth year] Married / Language: English / Race: White Female        History of Present Illness       This is a very pleasant 57 year old female. She is referred by Dr. Rachell Cipro, her PCP, and Hale Bogus, her gynecologist, for symptomatic gallstones. I operated on her father-in-law, Juliyah Mergen. I know her husband, Carlisha Wisler and her brother-in-law, Cataleah Stites. She is asymptomatic today.     She gives a one-year history of intermittent vague discomfort in the right upper quadrant. She says it feels like there is movement is somewhat uncomfortable. Over the past 3 or 4 months she has had 3 episodes of severe epigastric pain associated with nausea and vomiting. She doesn't know of any aggravating or alleviating factors. She denies diarrhea fever or jaundice. She is not really sure if there is any food intolerances. No history of liver disease pancreatic disease cardiac or pulmonary disease. Ultrasound was performed and shows multiple gallstones individually estimated up to 2 cm diameter. Mobile. No wall thickening. Common bile duct 3 mm. Perhaps hepatic steatosis.      Past history reveals stress incontinence. Hypertension. BMI 37. Shoulder surgery. Surgery age 58 Family history reveals mother living with diabetes and glaucoma. Father living with prostate cancer. Nobody has had a gallbladder operation. This is reveals she is married. 4 children. Denies tobacco. Alcohol intake rare. 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 She helps care for her mother and mother-in-law both of whom have dementia.      I advised her that her symptoms were fairly classic for gallbladder attacks and that this would likely progress. She doesn't want this to go on and wants to go ahead and schedule elective cholecystectomy. I told her she had an  umbilical hernia that we will repair as part of the operation. We will place one of the trocar sites at the umbilicus. She'll be scheduled for laparoscopic cholecystectomy with cholangiogram, possible open cholecystectomy, umbilical hernia repair in the near future. This hopefully can be an outpatient operation unless there are complicating factors. I discussed the indications, details, techniques, and numerous risk of surgery with her. I ever patient information booklet with her. She is aware of the risk of bleeding, infection, conversion to open laparotomy, port site hernia, diarrhea. Pancreatitis. Bowel leak, injury to adjacent organs such as the stomach or bile duct with major reconstructive surgery. She understands all of these issues. All of her questions are answered. She agrees with this plan.    Past Surgical History Shoulder Surgery  Left.  Diagnostic Studies History  Colonoscopy  5-10 years ago Mammogram  1-3 years ago Pap Smear  1-5 years ago  Allergies  No Known Drug Allergies Allergies Reconciled   Medication History  Chlorthalidone (25MG  Tablet, Oral) Active. Estradiol (0.5MG  Tablet, Oral) Active. Gabapentin (400MG  Capsule, Oral) Active. Oxybutynin Chloride ER (5MG  Tablet ER 24HR, Oral) Active. Olmesartan Medoxomil (20MG  Tablet, Oral) Active. MedroxyPROGESTERone Acetate (2.5MG  Tablet, Oral) Active.  Social History  Alcohol use  Occasional alcohol use. Caffeine use  Carbonated beverages. No drug use  Tobacco use  Never smoker.  Family History  Diabetes Mellitus  Mother. Kidney Disease  Mother. Migraine Headache  Father. Prostate Cancer  Father.  Pregnancy / Birth History  Age at menarche  69 years. Age of menopause  41-55 Gravida  4 Irregular periods  Length (months) of breastfeeding  7-12  Maternal age  27-30 Para  4  Other Problems  Arthritis  Bladder Problems  Cholelithiasis  Hemorrhoids     Review of  Systems  General Present- Fatigue. Not Present- Appetite Loss, Chills, Fever, Night Sweats, Weight Gain and Weight Loss. HEENT Present- Ringing in the Ears and Wears glasses/contact lenses. Not Present- Earache, Hearing Loss, Hoarseness, Nose Bleed, Oral Ulcers, Seasonal Allergies, Sinus Pain, Sore Throat, Visual Disturbances and Yellow Eyes. Respiratory Present- Snoring. Not Present- Bloody sputum, Chronic Cough, Difficulty Breathing and Wheezing. Breast Not Present- Breast Mass, Breast Pain, Nipple Discharge and Skin Changes. Cardiovascular Present- Leg Cramps and Swelling of Extremities. Not Present- Chest Pain, Difficulty Breathing Lying Down, Palpitations, Rapid Heart Rate and Shortness of Breath. Gastrointestinal Present- Abdominal Pain and Hemorrhoids. Not Present- Bloating, Bloody Stool, Change in Bowel Habits, Chronic diarrhea, Constipation, Difficulty Swallowing, Excessive gas, Gets full quickly at meals, Indigestion, Nausea, Rectal Pain and Vomiting. Female Genitourinary Present- Urgency. Not Present- Frequency, Nocturia, Painful Urination and Pelvic Pain. Musculoskeletal Not Present- Back Pain, Joint Pain, Joint Stiffness, Muscle Pain, Muscle Weakness and Swelling of Extremities. Neurological Not Present- Decreased Memory, Fainting, Headaches, Numbness, Seizures, Tingling, Tremor, Trouble walking and Weakness. Psychiatric Not Present- Anxiety, Bipolar, Change in Sleep Pattern, Depression, Fearful and Frequent crying. Endocrine Not Present- Cold Intolerance, Excessive Hunger, Hair Changes, Heat Intolerance, Hot flashes and New Diabetes. Hematology Not Present- Blood Thinners, Easy Bruising, Excessive bleeding, Gland problems, HIV and Persistent Infections.  Vitals  Weight: 240 lb Height: 67in Body Surface Area: 2.18 m Body Mass Index: 37.59 kg/m  Temp.: 98.60F  Pulse: 94 (Regular)  Resp.: 18 (Unlabored)  P.OX: 98% (Room air) BP: 124/80 (Sitting, Left Arm,  Standard)     Physical Exam  General Mental Status-Alert. General Appearance-Consistent with stated age. Hydration-Well hydrated. Voice-Normal. Note: BMI 37   Head and Neck Head-normocephalic, atraumatic with no lesions or palpable masses. Trachea-midline. Thyroid Gland Characteristics - normal size and consistency.  Eye Eyeball - Bilateral-Extraocular movements intact. Sclera/Conjunctiva - Bilateral-No scleral icterus.  Chest and Lung Exam Chest and lung exam reveals -quiet, even and easy respiratory effort with no use of accessory muscles and on auscultation, normal breath sounds, no adventitious sounds and normal vocal resonance. Inspection Chest Wall - Normal. Back - normal.  Cardiovascular Cardiovascular examination reveals -normal heart sounds, regular rate and rhythm with no murmurs and normal pedal pulses bilaterally.  Abdomen  Inspection of the abdomen reveals: Note: Small umbilical hernia. Mostly reducible. Defect relatively small. Skin healthy. Skin - Scar - no surgical scars. Palpation/Percussion Palpation and Percussion of the abdomen reveal - Soft, Non Tender, No Rebound tenderness, No Rigidity (guarding) and No hepatosplenomegaly. Auscultation Auscultation of the abdomen reveals - Bowel sounds normal.  Neurologic Neurologic evaluation reveals -alert and oriented x 3 with no impairment of recent or remote memory. Mental Status-Normal.  Musculoskeletal Normal Exam - Left-Upper Extremity Strength Normal and Lower Extremity Strength Normal. Normal Exam - Right-Upper Extremity Strength Normal and Lower Extremity Strength Normal.  Lymphatic Head & Neck  General Head & Neck Lymphatics: Bilateral - Description - Normal. Axillary  General Axillary Region: Bilateral - Description - Normal. Tenderness - Non Tender. Femoral & Inguinal  Generalized Femoral & Inguinal Lymphatics: Bilateral - Description - Normal. Tenderness - Non  Tender.    Assessment & Plan  GALLSTONES (K80.2   You report  episodes of right upper quadrant discomfort for at least 1 year Over the past 4 months you have had 3 episodes of more severe upper abdominal pain nausea  and vomiting The ultrasound shows multiple gallstones but there are no other significant abnormal findings on the ultrasound  You also have a small umbilical hernia which would need to be repaired as part of a gallbladder operation  We have discussed the natural history of gallbladder disease, which is to progressively get worse over time We have decided to go ahead with elective gallbladder surgery You'll be scheduled for laparoscopic cholecystectomy with cholangiogram, possible open cholecystectomy, and umbilical hernia repair in the near future I have discussed the indications, techniques, and risk of the surgery in detail If the surgery is uneventful, he will be able to go home the same day, as we discussed. Please read over the patient information booklet Low-fat diet is advised    HYPERTENSION, ESSENTIAL (I10) STRESS INCONTINENCE, FEMALE (N39.3) BMI 37.0-37.9, ADULT (J94.17) UMBILICAL HERNIA WITHOUT OBSTRUCTION OR GANGRENE (K42.9)    Shamieka Gullo M. Dalbert Batman, M.D., [facility] General and Minimally invasive Surgery Breast and Colorectal Surgery Office:   [phone] Pager:   [phone]

## 2017-12-08 DIAGNOSIS — Z1231 Encounter for screening mammogram for malignant neoplasm of breast: Secondary | ICD-10-CM | POA: Diagnosis not present

## 2017-12-12 ENCOUNTER — Other Ambulatory Visit: Payer: Self-pay

## 2017-12-12 ENCOUNTER — Ambulatory Visit (HOSPITAL_COMMUNITY): Payer: 59

## 2017-12-12 ENCOUNTER — Ambulatory Visit (HOSPITAL_COMMUNITY): Payer: 59 | Admitting: Anesthesiology

## 2017-12-12 ENCOUNTER — Ambulatory Visit: Payer: 59 | Admitting: Nurse Practitioner

## 2017-12-12 ENCOUNTER — Ambulatory Visit (HOSPITAL_COMMUNITY)
Admission: RE | Admit: 2017-12-12 | Discharge: 2017-12-12 | Disposition: A | Discharge status: Home / Self Care | Payer: 59 | Source: Ambulatory Visit | Attending: General Surgery | Admitting: General Surgery

## 2017-12-12 ENCOUNTER — Encounter (HOSPITAL_COMMUNITY)
Admission: RE | Disposition: A | Discharge status: Home / Self Care | Payer: Self-pay | Source: Ambulatory Visit | Attending: General Surgery

## 2017-12-12 ENCOUNTER — Encounter (HOSPITAL_COMMUNITY): Payer: Self-pay | Admitting: Certified Registered"

## 2017-12-12 DIAGNOSIS — K8020 Calculus of gallbladder without cholecystitis without obstruction: Secondary | ICD-10-CM | POA: Diagnosis not present

## 2017-12-12 DIAGNOSIS — E669 Obesity, unspecified: Secondary | ICD-10-CM | POA: Diagnosis not present

## 2017-12-12 DIAGNOSIS — K915 Postcholecystectomy syndrome: Secondary | ICD-10-CM | POA: Diagnosis not present

## 2017-12-12 DIAGNOSIS — K420 Umbilical hernia with obstruction, without gangrene: Secondary | ICD-10-CM | POA: Diagnosis present

## 2017-12-12 DIAGNOSIS — Z419 Encounter for procedure for purposes other than remedying health state, unspecified: Secondary | ICD-10-CM

## 2017-12-12 DIAGNOSIS — Z79899 Other long term (current) drug therapy: Secondary | ICD-10-CM | POA: Diagnosis not present

## 2017-12-12 DIAGNOSIS — I10 Essential (primary) hypertension: Secondary | ICD-10-CM | POA: Insufficient documentation

## 2017-12-12 DIAGNOSIS — K8010 Calculus of gallbladder with chronic cholecystitis without obstruction: Secondary | ICD-10-CM

## 2017-12-12 DIAGNOSIS — K429 Umbilical hernia without obstruction or gangrene: Secondary | ICD-10-CM | POA: Diagnosis not present

## 2017-12-12 DIAGNOSIS — Z6837 Body mass index (BMI) 37.0-37.9, adult: Secondary | ICD-10-CM | POA: Insufficient documentation

## 2017-12-12 HISTORY — DX: Umbilical hernia with obstruction, without gangrene: K42.0

## 2017-12-12 HISTORY — PX: UMBILICAL HERNIA REPAIR: SHX196

## 2017-12-12 HISTORY — DX: Calculus of gallbladder with chronic cholecystitis without obstruction: K80.10

## 2017-12-12 HISTORY — PX: CHOLECYSTECTOMY: SHX55

## 2017-12-12 SURGERY — LAPAROSCOPIC CHOLECYSTECTOMY WITH INTRAOPERATIVE CHOLANGIOGRAM
Anesthesia: General | Site: Abdomen

## 2017-12-12 MED ORDER — ROCURONIUM BROMIDE 50 MG/5ML IV SOSY
PREFILLED_SYRINGE | INTRAVENOUS | Status: AC
  Filled 2017-12-12: qty 5

## 2017-12-12 MED ORDER — DEXAMETHASONE SODIUM PHOSPHATE 10 MG/ML IJ SOLN
INTRAMUSCULAR | Status: AC
  Filled 2017-12-12: qty 1

## 2017-12-12 MED ORDER — PROPOFOL 10 MG/ML IV BOLUS
INTRAVENOUS | Status: AC
  Filled 2017-12-12: qty 20

## 2017-12-12 MED ORDER — ONDANSETRON HCL 4 MG/2ML IJ SOLN
INTRAMUSCULAR | Status: AC
  Filled 2017-12-12: qty 2

## 2017-12-12 MED ORDER — OXYCODONE HCL 5 MG/5ML PO SOLN: 5.0000 mg | Freq: Once | ORAL | Status: DC | PRN

## 2017-12-12 MED ORDER — CHLORHEXIDINE GLUCONATE CLOTH 2 % EX PADS: 6.0000 | MEDICATED_PAD | Freq: Once | CUTANEOUS | Status: DC

## 2017-12-12 MED ORDER — GABAPENTIN 300 MG PO CAPS
300.0000 mg | ORAL_CAPSULE | ORAL | Status: AC
  Administered 2017-12-12: 300 mg via ORAL
  Filled 2017-12-12: qty 1

## 2017-12-12 MED ORDER — SODIUM CHLORIDE 0.9 % IV SOLN
INTRAVENOUS | Status: DC | PRN
  Administered 2017-12-12: 5 mL

## 2017-12-12 MED ORDER — LIDOCAINE 2% (20 MG/ML) 5 ML SYRINGE
INTRAMUSCULAR | Status: DC | PRN
  Administered 2017-12-12: 80 mg via INTRAVENOUS

## 2017-12-12 MED ORDER — STERILE WATER FOR IRRIGATION IR SOLN
Status: DC | PRN
  Administered 2017-12-12: 1000 mL

## 2017-12-12 MED ORDER — MEPERIDINE HCL 50 MG/ML IJ SOLN: 6.2500 mg | INTRAMUSCULAR | Status: DC | PRN

## 2017-12-12 MED ORDER — PHENYLEPHRINE 40 MCG/ML (10ML) SYRINGE FOR IV PUSH (FOR BLOOD PRESSURE SUPPORT)
PREFILLED_SYRINGE | INTRAVENOUS | Status: AC
  Filled 2017-12-12: qty 10

## 2017-12-12 MED ORDER — MIDAZOLAM HCL 5 MG/5ML IJ SOLN
INTRAMUSCULAR | Status: DC | PRN
  Administered 2017-12-12: 2 mg via INTRAVENOUS

## 2017-12-12 MED ORDER — DEXAMETHASONE SODIUM PHOSPHATE 4 MG/ML IJ SOLN
INTRAMUSCULAR | Status: DC | PRN
  Administered 2017-12-12: 8 mg via INTRAVENOUS

## 2017-12-12 MED ORDER — PROPOFOL 10 MG/ML IV BOLUS
INTRAVENOUS | Status: DC | PRN
  Administered 2017-12-12: 170 mg via INTRAVENOUS

## 2017-12-12 MED ORDER — OXYCODONE HCL 5 MG PO TABS: 5.0000 mg | ORAL_TABLET | Freq: Once | ORAL | Status: DC | PRN

## 2017-12-12 MED ORDER — MIDAZOLAM HCL 2 MG/2ML IJ SOLN
INTRAMUSCULAR | Status: AC
  Filled 2017-12-12: qty 2

## 2017-12-12 MED ORDER — ACETAMINOPHEN 500 MG PO TABS
1000.0000 mg | ORAL_TABLET | ORAL | Status: AC
  Administered 2017-12-12: 1000 mg via ORAL
  Filled 2017-12-12: qty 2

## 2017-12-12 MED ORDER — 0.9 % SODIUM CHLORIDE (POUR BTL) OPTIME
TOPICAL | Status: DC | PRN
  Administered 2017-12-12: 1000 mL

## 2017-12-12 MED ORDER — HYDROMORPHONE HCL 1 MG/ML IJ SOLN: 0.2500 mg | INTRAMUSCULAR | Status: DC | PRN

## 2017-12-12 MED ORDER — SUGAMMADEX SODIUM 500 MG/5ML IV SOLN
INTRAVENOUS | Status: AC
  Filled 2017-12-12: qty 5

## 2017-12-12 MED ORDER — ONDANSETRON HCL 4 MG/2ML IJ SOLN
INTRAMUSCULAR | Status: DC | PRN
  Administered 2017-12-12: 4 mg via INTRAVENOUS

## 2017-12-12 MED ORDER — FENTANYL CITRATE (PF) 100 MCG/2ML IJ SOLN
INTRAMUSCULAR | Status: DC | PRN
  Administered 2017-12-12 (×2): 50 ug via INTRAVENOUS
  Administered 2017-12-12: 100 ug via INTRAVENOUS
  Administered 2017-12-12 (×2): 50 ug via INTRAVENOUS

## 2017-12-12 MED ORDER — BUPIVACAINE-EPINEPHRINE (PF) 0.5% -1:200000 IJ SOLN
INTRAMUSCULAR | Status: AC
  Filled 2017-12-12: qty 30

## 2017-12-12 MED ORDER — SUGAMMADEX SODIUM 200 MG/2ML IV SOLN
INTRAVENOUS | Status: DC | PRN
  Administered 2017-12-12: 350 mg via INTRAVENOUS

## 2017-12-12 MED ORDER — LACTATED RINGERS IV SOLN
INTRAVENOUS | Status: DC
  Administered 2017-12-12: 07:00:00 via INTRAVENOUS

## 2017-12-12 MED ORDER — BUPIVACAINE-EPINEPHRINE 0.5% -1:200000 IJ SOLN
INTRAMUSCULAR | Status: DC | PRN
  Administered 2017-12-12: 20 mL

## 2017-12-12 MED ORDER — FENTANYL CITRATE (PF) 250 MCG/5ML IJ SOLN
INTRAMUSCULAR | Status: AC
  Filled 2017-12-12: qty 5

## 2017-12-12 MED ORDER — SODIUM CHLORIDE 0.9 % IR SOLN
Status: DC | PRN
  Administered 2017-12-12: 1000 mL

## 2017-12-12 MED ORDER — CEFAZOLIN SODIUM-DEXTROSE 2-4 GM/100ML-% IV SOLN
2.0000 g | INTRAVENOUS | Status: AC
  Administered 2017-12-12: 2 g via INTRAVENOUS
  Filled 2017-12-12: qty 100

## 2017-12-12 MED ORDER — LIDOCAINE 2% (20 MG/ML) 5 ML SYRINGE
INTRAMUSCULAR | Status: AC
  Filled 2017-12-12: qty 5

## 2017-12-12 MED ORDER — LACTATED RINGERS IV SOLN
INTRAVENOUS | Status: DC | PRN
  Administered 2017-12-12 (×2): via INTRAVENOUS

## 2017-12-12 MED ORDER — CELECOXIB 200 MG PO CAPS
200.0000 mg | ORAL_CAPSULE | ORAL | Status: AC
  Administered 2017-12-12: 200 mg via ORAL
  Filled 2017-12-12: qty 1

## 2017-12-12 MED ORDER — HYDROCODONE-ACETAMINOPHEN 5-325 MG PO TABS: 1.0000 | ORAL_TABLET | Freq: Four times a day (QID) | ORAL | 0 refills | Status: DC | PRN

## 2017-12-12 MED ORDER — KETAMINE HCL 50 MG/5ML IJ SOSY
PREFILLED_SYRINGE | INTRAMUSCULAR | Status: AC
  Filled 2017-12-12: qty 5

## 2017-12-12 MED ORDER — PROMETHAZINE HCL 25 MG/ML IJ SOLN: 6.2500 mg | INTRAMUSCULAR | Status: DC | PRN

## 2017-12-12 MED ORDER — LIDOCAINE IN D5W 4-5 MG/ML-% IV SOLN
1.0000 mg/min | INTRAVENOUS | Status: DC
  Filled 2017-12-12: qty 500

## 2017-12-12 MED ORDER — ROCURONIUM BROMIDE 100 MG/10ML IV SOLN
INTRAVENOUS | Status: DC | PRN
  Administered 2017-12-12: 50 mg via INTRAVENOUS
  Administered 2017-12-12: 10 mg via INTRAVENOUS

## 2017-12-12 MED ORDER — IOPAMIDOL (ISOVUE-300) INJECTION 61%
INTRAVENOUS | Status: AC
  Filled 2017-12-12: qty 50

## 2017-12-12 SURGICAL SUPPLY — 58 items
ADH SKN CLS APL DERMABOND .7 (GAUZE/BANDAGES/DRESSINGS) ×1
APL SKNCLS STERI-STRIP NONHPOA (GAUZE/BANDAGES/DRESSINGS) ×1
APPLIER CLIP ROT 10 11.4 M/L (STAPLE) ×2
APR CLP MED LRG 11.4X10 (STAPLE) ×1
BAG SPEC RTRVL LRG 6X4 10 (ENDOMECHANICALS) ×1
BENZOIN TINCTURE PRP APPL 2/3 (GAUZE/BANDAGES/DRESSINGS) ×2 IMPLANT
BLADE CLIPPER SURG (BLADE) IMPLANT
CANISTER SUCT 3000ML PPV (MISCELLANEOUS) ×2 IMPLANT
CHLORAPREP W/TINT 26ML (MISCELLANEOUS) ×2 IMPLANT
CLIP APPLIE ROT 10 11.4 M/L (STAPLE) ×1 IMPLANT
COVER MAYO STAND STRL (DRAPES) ×2 IMPLANT
COVER SURGICAL LIGHT HANDLE (MISCELLANEOUS) ×2 IMPLANT
DERMABOND ADVANCED (GAUZE/BANDAGES/DRESSINGS) ×1
DERMABOND ADVANCED .7 DNX12 (GAUZE/BANDAGES/DRESSINGS) ×1 IMPLANT
DRAPE C-ARM 42X72 X-RAY (DRAPES) ×2 IMPLANT
DRAPE LAPAROTOMY T 102X78X121 (DRAPES) ×1 IMPLANT
DRAPE UTILITY XL STRL (DRAPES) ×2 IMPLANT
ELECT CAUTERY BLADE 6.4 (BLADE) ×1 IMPLANT
ELECT REM PT RETURN 9FT ADLT (ELECTROSURGICAL) ×2
ELECTRODE REM PT RTRN 9FT ADLT (ELECTROSURGICAL) ×1 IMPLANT
GAUZE SPONGE 4X4 12PLY STRL (GAUZE/BANDAGES/DRESSINGS) ×2 IMPLANT
GLOVE EUDERMIC 7 POWDERFREE (GLOVE) ×2 IMPLANT
GOWN STRL REUS W/ TWL LRG LVL3 (GOWN DISPOSABLE) ×2 IMPLANT
GOWN STRL REUS W/ TWL XL LVL3 (GOWN DISPOSABLE) ×1 IMPLANT
GOWN STRL REUS W/TWL LRG LVL3 (GOWN DISPOSABLE) ×4
GOWN STRL REUS W/TWL XL LVL3 (GOWN DISPOSABLE) ×4
KIT BASIN OR (CUSTOM PROCEDURE TRAY) ×2 IMPLANT
KIT TURNOVER KIT B (KITS) ×2 IMPLANT
NDL HYPO 25GX1X1/2 BEV (NEEDLE) ×1 IMPLANT
NEEDLE HYPO 25GX1X1/2 BEV (NEEDLE) IMPLANT
NS IRRIG 1000ML POUR BTL (IV SOLUTION) ×2 IMPLANT
PACK SURGICAL SETUP 50X90 (CUSTOM PROCEDURE TRAY) ×2 IMPLANT
PAD ARMBOARD 7.5X6 YLW CONV (MISCELLANEOUS) ×4 IMPLANT
PENCIL BUTTON HOLSTER BLD 10FT (ELECTRODE) ×1 IMPLANT
POUCH SPECIMEN RETRIEVAL 10MM (ENDOMECHANICALS) ×2 IMPLANT
SCISSORS LAP 5X35 DISP (ENDOMECHANICALS) ×2 IMPLANT
SET CHOLANGIOGRAPH 5 50 .035 (SET/KITS/TRAYS/PACK) ×2 IMPLANT
SET IRRIG TUBING LAPAROSCOPIC (IRRIGATION / IRRIGATOR) ×2 IMPLANT
SLEEVE ENDOPATH XCEL 5M (ENDOMECHANICALS) ×2 IMPLANT
SPECIMEN JAR SMALL (MISCELLANEOUS) ×2 IMPLANT
SPONGE LAP 18X18 X RAY DECT (DISPOSABLE) IMPLANT
SPONGE LAP 4X18 RFD (DISPOSABLE) ×1 IMPLANT
STRIP CLOSURE SKIN 1/2X4 (GAUZE/BANDAGES/DRESSINGS) ×2 IMPLANT
SUT MNCRL AB 4-0 PS2 18 (SUTURE) ×3 IMPLANT
SUT NOVA NAB DX-16 0-1 5-0 T12 (SUTURE) ×1 IMPLANT
SUT PROLENE 0 CT 1 30 (SUTURE) IMPLANT
SUT SILK 3 0 SH CR/8 (SUTURE) IMPLANT
SYR CONTROL 10ML LL (SYRINGE) ×1 IMPLANT
TOWEL OR 17X24 6PK STRL BLUE (TOWEL DISPOSABLE) ×2 IMPLANT
TOWEL OR 17X26 10 PK STRL BLUE (TOWEL DISPOSABLE) ×2 IMPLANT
TRAY LAPAROSCOPIC MC (CUSTOM PROCEDURE TRAY) ×2 IMPLANT
TROCAR XCEL BLUNT TIP 100MML (ENDOMECHANICALS) ×2 IMPLANT
TROCAR XCEL NON-BLD 11X100MML (ENDOMECHANICALS) ×2 IMPLANT
TROCAR XCEL NON-BLD 5MMX100MML (ENDOMECHANICALS) ×2 IMPLANT
TUBE CONNECTING 12X1/4 (SUCTIONS) IMPLANT
TUBING INSUFFLATION (TUBING) ×1 IMPLANT
WATER STERILE IRR 1000ML POUR (IV SOLUTION) ×2 IMPLANT
YANKAUER SUCT BULB TIP NO VENT (SUCTIONS) IMPLANT

## 2017-12-12 NOTE — Transfer of Care (Signed)
Immediate Anesthesia Transfer of Care Note  Patient: Erica Daugherty  Procedure(s) Performed: LAPAROSCOPIC CHOLECYSTECTOMY WITH INTRAOPERATIVE CHOLANGIOGRAM ERAS PATHWAY (N/A Abdomen) REPAIR UMBILICAL HERNIA (N/A Abdomen)  Patient Location: PACU  Anesthesia Type:General  Level of Consciousness: drowsy and patient cooperative  Airway & Oxygen Therapy: Patient Spontanous Breathing and Patient connected to face mask oxygen  Post-op Assessment: Report given to RN and Post -op Vital signs reviewed and stable  Post vital signs: Reviewed and stable  Last Vitals:  Vitals Value Taken Time  BP    Temp    Pulse 86 12/12/2017  9:19 AM  Resp 13 12/12/2017  9:19 AM  SpO2 100 % 12/12/2017  9:19 AM  Vitals shown include unvalidated device data.  Last Pain:  Vitals:   12/12/17 0707  TempSrc:   PainSc: 0-No pain         Complications: No apparent anesthesia complications

## 2017-12-12 NOTE — Anesthesia Preprocedure Evaluation (Addendum)
Anesthesia Evaluation  Patient identified by MRN, date of birth, ID band Patient awake    Reviewed: Allergy & Precautions, NPO status , Patient's Chart, lab work & pertinent test results  Airway Mallampati: II  TM Distance: >3 FB Neck ROM: Full    Dental no notable dental hx. (+) Dental Advisory Given, Caps   Pulmonary neg pulmonary ROS,    Pulmonary exam normal breath sounds clear to auscultation       Cardiovascular hypertension, negative cardio ROS Normal cardiovascular exam Rhythm:Regular Rate:Normal     Neuro/Psych negative neurological ROS  negative psych ROS   GI/Hepatic negative GI ROS, Neg liver ROS,   Endo/Other  negative endocrine ROS  Renal/GU negative Renal ROS  negative genitourinary   Musculoskeletal negative musculoskeletal ROS (+)   Abdominal (+) + obese,   Peds negative pediatric ROS (+)  Hematology negative hematology ROS (+)   Anesthesia Other Findings   Reproductive/Obstetrics negative OB ROS                            Anesthesia Physical Anesthesia Plan  ASA: II  Anesthesia Plan: General   Post-op Pain Management:    Induction: Intravenous  PONV Risk Score and Plan: 3 and Ondansetron, Dexamethasone and Midazolam  Airway Management Planned: Oral ETT  Additional Equipment:   Intra-op Plan:   Post-operative Plan: Extubation in OR  Informed Consent: I have reviewed the patients History and Physical, chart, labs and discussed the procedure including the risks, benefits and alternatives for the proposed anesthesia with the patient or authorized representative who has indicated his/her understanding and acceptance.   Dental advisory given  Plan Discussed with: CRNA  Anesthesia Plan Comments:         Anesthesia Quick Evaluation

## 2017-12-12 NOTE — Anesthesia Postprocedure Evaluation (Signed)
Anesthesia Post Note  Patient: Erica Daugherty  Procedure(s) Performed: LAPAROSCOPIC CHOLECYSTECTOMY WITH INTRAOPERATIVE CHOLANGIOGRAM ERAS PATHWAY (N/A Abdomen) REPAIR UMBILICAL HERNIA (N/A Abdomen)     Patient location during evaluation: PACU Anesthesia Type: General Level of consciousness: awake and alert Pain management: pain level controlled Vital Signs Assessment: post-procedure vital signs reviewed and stable Respiratory status: spontaneous breathing, nonlabored ventilation and respiratory function stable Cardiovascular status: blood pressure returned to baseline and stable Postop Assessment: no apparent nausea or vomiting Anesthetic complications: no    Last Vitals:  Vitals:   12/12/17 1000 12/12/17 1013  BP:  135/78  Pulse: 60 67  Resp: 11 12  Temp:    SpO2: 96% 98%    Last Pain:  Vitals:   12/12/17 0930  TempSrc:   PainSc: Asleep                 Lynda Rainwater

## 2017-12-12 NOTE — Op Note (Signed)
Patient Name:           Erica Daugherty   Date of Surgery:        12/12/2017  Pre op Diagnosis:      Chronic cholecystitis with cholelithiasis                                       Incarcerated umbilical hernia  Post op Diagnosis:    Chronic cholecystitis with cholelithiasis                                       Incarcerated umbilical hernia  Procedure:                 Laparoscopic cholecystectomy with intraoperative cholangiogram                                      Primary repair of incarcerated umbilical hernia  Surgeon:                     Edsel Petrin. Dalbert Batman, M.D., FACS  Assistant:                      Autumn Messing, MD   Indication for Assistant: Complex exposure, chronic inflammation  Operative Indications:   This is a very pleasant 57 year old female. She is referred by Dr. Rachell Cipro, her PCP, and Hale Bogus, her gynecologist, for symptomatic gallstones.      She gives a one-year history of intermittent vague discomfort in the right upper quadrant. Over the past 3 or 4 months she has had 3 episodes of severe epigastric pain associated with nausea and vomiting. She doesn't know of any aggravating or alleviating factors. She denies diarrhea fever or jaundice. She is not really sure if there is any food intolerances. No history of liver disease pancreatic disease cardiac or pulmonary disease. Ultrasound was performed and shows multiple gallstones individually estimated up to 2 cm diameter. Mobile. No wall thickening. Common bile duct 3 mm. Perhaps hepatic steatosis..    She doesn't want this to go on and wants to go ahead and schedule elective cholecystectomy. I told her she had an umbilical hernia that we will repair as part of the operation. She'll be scheduled for laparoscopic cholecystectomy with cholangiogram, possible open cholecystectomy, umbilical hernia repair in the near future.   Operative Findings:       The gallbladder was chronically inflamed but thin-walled.   The anatomy of the cystic duct cystic artery and common bile duct were conventional.  The intraoperative cholangiogram was normal showing normal intrahepatic and extrahepatic biliary tree, no filling defects, good flow of contrast into the duodenum and no dilatation.  The liver appeared healthy.  4 quadrant inspection revealed no other pathologic findings.  She had an incarcerated umbilical hernia with a defect about 2.5 cm that was closed primarily with Novafil sutures.  Procedure in Detail:          Following the induction of general endotracheal anesthesia the abdomen was prepped and draped in a sterile fashion.  Surgical timeout was performed.  Intravenous antibiotics were given.  0.5% Marcaine with epinephrine was used as a local infiltration anesthetic.       A  vertical incision was made in the lower rim of the umbilicus.  We dissected out the incarcerated preperitoneal fat from the hernia sac.  We undermined the fascia circumferentially to prepare for the umbilical hernia repair..  An 11 mm Hassan trocar was inserted and secured with a pursestring suture of 0 Vicryl.  Pneumoperitoneum was created.  Video camera was inserted.  A trocar was placed in the subxiphoid region and two  5 mm trochars placed in the right upper quadrant.       The gallbladder fundus was identified and elevated.  The infundibulum was retracted laterally.  Adhesions were taken down.  I dissected the peritoneum off of the neck of the gallbladder and slowly dissected out the cystic duct and the cystic artery.  I created a large window behind the cystic duct and cystic artery.  Cystic artery was secured with multiple metal clips and divided.  The window was enlarged.  A cholangiogram catheter was inserted into the cystic duct.  A cholangiogram was obtained using the C arm.  The cholangiogram was normal as described above.  We removed the cholangiogram catheter and secured the cystic duct with multiple metal clips and divided it.  The  gallbladder was dissected from its bed, placed in a specimen bag and removed and sent the pathology.  The operative field was copiously irrigated.  Small bleeders in the gallbladder bed were cauterized.  At the completion of the case there was no bleeding or bile leak whatsoever and the irrigation fluid was clear.  4 quadrant inspection revealed no injury.  The pneumoperitoneum was released and the trochars were removed.      We elevated the periumbilical fascia with Kocher clamps.  The umbilical hernia repair was accomplished with 5 interrupted sutures of #1 Novafil.  All the sutures were placed and then they were all tied and this provided very secure repair.  The wounds were irrigated.  All skin incisions were closed with subcuticular 4-0 Monocryl and Dermabond.  Patient tolerated the procedure well and was taken to PACU in stable condition.  EBL 10 to 20 cc.  Counts correct.  Complications none.    Addendum: I logged onto the [employer] and reviewed her prescription medication history     Crystalmarie Yasin M. Dalbert Batman, M.D., FACS General and Minimally Invasive Surgery Breast and Colorectal Surgery  12/12/2017 9:20 AM

## 2017-12-12 NOTE — Interval H&P Note (Signed)
History and Physical Interval Note:  1/58/6825 7:49 AM  Erica Daugherty  has presented today for surgery, with the diagnosis of gallstones  umbilical hernia  The various methods of treatment have been discussed with the patient and family. After consideration of risks, benefits and other options for treatment, the patient has consented to  Procedure(s): LAPAROSCOPIC CHOLECYSTECTOMY WITH INTRAOPERATIVE CHOLANGIOGRAM ERAS PATHWAY (N/A) REPAIR UMBILICAL HERNIA (N/A) as a surgical intervention .  The patient's history has been reviewed, patient examined, no change in status, stable for surgery.  I have reviewed the patient's chart and labs.  Questions were answered to the patient's satisfaction.     [name]

## 2017-12-12 NOTE — Anesthesia Procedure Notes (Signed)
Procedure Name: Intubation Date/Time: 12/12/2017 7:56 AM Performed by: Orlie Dakin, CRNA Pre-anesthesia Checklist: Patient identified, Emergency Drugs available, Suction available and Patient being monitored Patient Re-evaluated:Patient Re-evaluated prior to induction Oxygen Delivery Method: Circle system utilized Preoxygenation: Pre-oxygenation with 100% oxygen Induction Type: IV induction Ventilation: Mask ventilation without difficulty Laryngoscope Size: Miller and 3 Grade View: Grade I Tube type: Oral Tube size: 7.0 mm Number of attempts: 1 Airway Equipment and Method: Stylet Placement Confirmation: ETT inserted through vocal cords under direct vision,  positive ETCO2 and breath sounds checked- equal and bilateral Secured at: 23 cm Tube secured with: Tape Dental Injury: Teeth and Oropharynx as per pre-operative assessment  Comments: 4x4s bite block used.

## 2017-12-12 NOTE — Discharge Instructions (Signed)
[facility] ______[facility] LAPAROSCOPIC SURGERY: POST OP INSTRUCTIONS Always review your discharge instruction sheet given to you by the facility where your surgery was performed. IF YOU HAVE DISABILITY OR FAMILY LEAVE FORMS, YOU MUST BRING THEM TO THE OFFICE FOR PROCESSING.   DO NOT GIVE THEM TO YOUR DOCTOR.  1. A prescription for pain medication may be given to you upon discharge.  Take your pain medication as prescribed, if needed.  If narcotic pain medicine is not needed, then you may take acetaminophen (Tylenol) or ibuprofen (Advil) as needed. 2. Take your usually prescribed medications unless otherwise directed. 3. If you need a refill on your pain medication, please contact your pharmacy.  They will contact our office to request authorization. Prescriptions will not be filled after 5pm or on week-ends. 4. You should follow a light diet the first few days after arrival home, such as soup and crackers, etc.  Be sure to include lots of fluids daily. 5. Most patients will experience some swelling and bruising in the area of the incisions.  Ice packs will help.  Swelling and bruising can take several days to resolve.  6. It is common to experience some constipation if taking pain medication after surgery.  Increasing fluid intake and taking a stool softener (such as Colace) will usually help or prevent this problem from occurring.  A mild laxative (Milk of Magnesia or Miralax) should be taken according to package instructions if there are no bowel movements after 48 hours. 7. Unless discharge instructions indicate otherwise, you may remove your bandages 24-48 hours after surgery, and you may shower at that time.  You may have steri-strips (small skin tapes) in place directly over the incision.  These strips should be left on the skin for 7-10 days.  If your surgeon used skin glue on the incision, you may shower in 24 hours.  The glue will flake off over the next 2-3 weeks.  Any sutures or  staples will be removed at the office during your follow-up visit. 8. ACTIVITIES:  You may resume regular (light) daily activities beginning the next day--such as daily self-care, walking, climbing stairs--gradually increasing activities as tolerated.  You may have sexual intercourse when it is comfortable.  Refrain from any heavy lifting or straining until approved by your doctor. a. You may drive when you are no longer taking prescription pain medication, you can comfortably wear a seatbelt, and you can safely maneuver your car and apply brakes. b. RETURN TO WORK:  __________________________________________________________ 9. You should see your doctor in the office for a follow-up appointment approximately 2-3 weeks after your surgery.  Make sure that you call for this appointment within a day or two after you arrive home to insure a convenient appointment time. 10. OTHER INSTRUCTIONS: __________________________low fat diet is a good idea.   No sports or lifting for 4 weeks. 11.                                      ________________________________________________________________________________________________ __________________________________________________________________________________________________________________________ WHEN TO CALL YOUR DOCTOR: 1. Fever over 101.0 2. Inability to urinate 3. Continued bleeding from incision. 4. Increased pain, redness, or drainage from the incision. 5. Increasing abdominal pain  The clinic staff is available to answer your questions during regular business hours.  Please dont hesitate to call and ask to speak to one of the nurses for clinical concerns.  If you have a  medical emergency, go to the nearest emergency room or call 911.  A surgeon from [facility] is always on call at the hospital. [address], [place], [place], [place]  [address] ? [address], [place], [place]   [address] [phone] ? [phone] ? FAX [phone]  [phone] Web site: [email or web address]

## 2017-12-13 ENCOUNTER — Encounter (HOSPITAL_COMMUNITY): Payer: Self-pay | Admitting: General Surgery

## 2017-12-16 ENCOUNTER — Other Ambulatory Visit: Payer: Self-pay | Admitting: Nurse Practitioner

## 2017-12-19 ENCOUNTER — Ambulatory Visit: Payer: 59 | Admitting: Nurse Practitioner

## 2018-01-08 DIAGNOSIS — R350 Frequency of micturition: Secondary | ICD-10-CM | POA: Diagnosis not present

## 2018-01-20 ENCOUNTER — Other Ambulatory Visit: Payer: Self-pay | Admitting: Neurology

## 2018-02-02 DIAGNOSIS — Z0000 Encounter for general adult medical examination without abnormal findings: Secondary | ICD-10-CM | POA: Diagnosis not present

## 2018-02-19 DIAGNOSIS — N3946 Mixed incontinence: Secondary | ICD-10-CM | POA: Diagnosis not present

## 2018-02-19 DIAGNOSIS — R350 Frequency of micturition: Secondary | ICD-10-CM | POA: Diagnosis not present

## 2018-02-20 DIAGNOSIS — E876 Hypokalemia: Secondary | ICD-10-CM | POA: Diagnosis not present

## 2018-02-20 DIAGNOSIS — I10 Essential (primary) hypertension: Secondary | ICD-10-CM | POA: Diagnosis not present

## 2018-02-20 DIAGNOSIS — Z6836 Body mass index (BMI) 36.0-36.9, adult: Secondary | ICD-10-CM | POA: Diagnosis not present

## 2018-02-20 DIAGNOSIS — Z0000 Encounter for general adult medical examination without abnormal findings: Secondary | ICD-10-CM | POA: Diagnosis not present

## 2018-02-24 DIAGNOSIS — M6281 Muscle weakness (generalized): Secondary | ICD-10-CM | POA: Diagnosis not present

## 2018-02-24 DIAGNOSIS — M6289 Other specified disorders of muscle: Secondary | ICD-10-CM | POA: Diagnosis not present

## 2018-02-24 DIAGNOSIS — N3946 Mixed incontinence: Secondary | ICD-10-CM | POA: Diagnosis not present

## 2018-03-06 DIAGNOSIS — M6281 Muscle weakness (generalized): Secondary | ICD-10-CM | POA: Diagnosis not present

## 2018-03-06 DIAGNOSIS — N3946 Mixed incontinence: Secondary | ICD-10-CM | POA: Diagnosis not present

## 2018-03-06 DIAGNOSIS — M6289 Other specified disorders of muscle: Secondary | ICD-10-CM | POA: Diagnosis not present

## 2018-03-10 ENCOUNTER — Ambulatory Visit (INDEPENDENT_AMBULATORY_CARE_PROVIDER_SITE_OTHER): Payer: 59 | Admitting: Neurology

## 2018-03-10 ENCOUNTER — Encounter: Payer: Self-pay | Admitting: Neurology

## 2018-03-10 VITALS — BP 122/79 | HR 80 | Ht 66.5 in | Wt 237.0 lb

## 2018-03-10 DIAGNOSIS — G629 Polyneuropathy, unspecified: Secondary | ICD-10-CM | POA: Diagnosis not present

## 2018-03-10 MED ORDER — GABAPENTIN 100 MG PO CAPS: 100.0000 mg | ORAL_CAPSULE | Freq: Three times a day (TID) | ORAL | 11 refills | Status: DC

## 2018-03-10 MED ORDER — DICLOFENAC SODIUM 1 % TD GEL: 4.0000 g | Freq: Four times a day (QID) | TRANSDERMAL | 11 refills | Status: DC

## 2018-03-10 MED ORDER — LIDOCAINE-PRILOCAINE 2.5-2.5 % EX CREA: 1.0000 "application " | TOPICAL_CREAM | CUTANEOUS | 3 refills | Status: DC | PRN

## 2018-03-10 MED ORDER — GABAPENTIN 400 MG PO CAPS: 400.0000 mg | ORAL_CAPSULE | Freq: Every day | ORAL | 4 refills | Status: DC

## 2018-03-10 NOTE — Progress Notes (Signed)
[facility]  PATIENT: Erica Daugherty DOB: [id number]   REASON FOR VISIT: Follow-up for peripheral neuropathy, worsening sx HISTORY FROM: Patient    HISTORY OF PRESENT ILLNESS: HISTORY:Erica Daugherty is a 57 years old right-handed Caucasian female, referred by her primary care physician Dr. Unice Cobble, and pain management Dr. 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 She complains of 6 months history of bilateral feet paresthesia, at plantar surface, pad, numbness, burning sensation, needle prick, She denies weakness, getting sore after weight bearing. She does have low back pain, going down her legs. She denies gait difficulty, no fingers paresthesia, no incontinence, Laboratory in December 2014, which has demonstrates normal CMP, CBC, TSH, LDL was elevated 152  UPDATE July 9th 2015:She is taking Gabapentin 100mg  bid, which does help her symptoms, Labs showed normal S50, folic acid, RPR, mild elevated A1c 5.7, normal electrophoresis.   UPDATE Mar 10 2018: She was seen by NP Hoyle Sauer over past few years, she continues to complains of plantar feet swollen, was seen by vein specialist, She denies low back pain, painful to walk, she is now see urologist for urinary incontinence,   She was taking gabapentin 400 mg every night, which has been helpful, symptomatic during the day, gabapentin makes her very sleepy,  REVIEW OF SYSTEMS: Full 14 system review of systems performed and notable only for those listed, all others are neg:   ALLERGIES: No Known Allergies  HOME MEDICATIONS: Outpatient Medications Prior to Visit  Medication Sig Dispense Refill  . chlorthalidone (HYGROTON) 25 MG tablet Take 25 mg by mouth daily.     . Cholecalciferol (VITAMIN D PO) Take 5,000 Int'l Units/day  by mouth.    . Cyanocobalamin 2500 MCG TABS Take 2,500 mcg by mouth daily.     Marland Kitchen estradiol (ESTRACE) 0.5 MG tablet 1/2 tab daily (Patient taking differently: Take 0.25 mg by mouth daily. 1/2 tab daily) 45 tablet 4  . gabapentin (NEURONTIN) 400 MG capsule TAKE 1 CAPSULE AT BEDTIME. 30 capsule 1  . medroxyPROGESTERone (PROVERA) 2.5 MG tablet 1 tab daily, days 1-15 each month 45 tablet 4  . mirabegron ER (MYRBETRIQ) 50 MG TB24 tablet Take 50 mg by mouth daily.    Marland Kitchen olmesartan (BENICAR) 20 MG tablet Take 20 mg by mouth daily.     . rosuvastatin (CRESTOR) 5 MG tablet Take 5 mg by mouth daily.    Marland Kitchen HYDROcodone-acetaminophen (NORCO) 5-325 MG tablet Take 1-2 tablets by mouth every 6 (six) hours as needed for moderate pain or severe pain. 20 tablet 0  . oxybutynin (DITROPAN-XL) 5 MG 24 hr tablet Take 10 mg by mouth at bedtime.      No facility-administered medications prior to visit.     PAST MEDICAL HISTORY: Past Medical History:  Diagnosis Date  . Cholelithiasis with chronic cholecystitis without biliary obstruction 12/12/2017  . Gallstones   . History of decreased platelet count   . Hyperlipidemia   . Hypertension   . Incarcerated umbilical hernia 5/39/7673  . Umbilical hernia     PAST SURGICAL HISTORY: Past Surgical History:  Procedure Laterality Date  . CHOLECYSTECTOMY N/A 12/12/2017   Procedure: LAPAROSCOPIC CHOLECYSTECTOMY WITH INTRAOPERATIVE CHOLANGIOGRAM ERAS PATHWAY;  Surgeon: Fanny Skates, MD;  Location: [facility];  Service: General;  Laterality: N/A;  . EYE SURGERY  1964   lazy eye  .  no colonoscopy     03/26/13  & 04/28/14 SOC reviewed  . SHOULDER SURGERY  2012   Left ; Dr Onnie Graham  . UMBILICAL HERNIA REPAIR N/A 12/12/2017   Procedure: REPAIR UMBILICAL HERNIA;  Surgeon: Fanny Skates, MD;  Location: [facility];  Service: General;  Laterality: N/A;    FAMILY HISTORY: Family History  Problem Relation Age of Onset  . Diabetes Mother   . Hyperlipidemia Mother   . Hypertension Mother    . Thyroid nodules Mother   . Testicular cancer Brother 10  . Diabetes Brother   . Prostate cancer Father 76  . Stroke Neg Hx   . Heart disease Neg Hx   . Colon cancer Neg Hx   . Stomach cancer Neg Hx     SOCIAL HISTORY: Social History   Socioeconomic History  . Marital status: Married    Spouse name: Erica Daugherty  . Number of children: 4  . Years of education: college  . Highest education level: Not on file  Occupational History  . Occupation: Pharmacist, hospital: [employer]  . Financial resource strain: Not on file  . Food insecurity:    Worry: Not on file    Inability: Not on file  . Transportation needs:    Medical: Not on file    Non-medical: Not on file  Tobacco Use  . Smoking status: Never Smoker  . Smokeless tobacco: Never Used  Substance and Sexual Activity  . Alcohol use: Yes    Comment: occ  . Drug use: No  . Sexual activity: Yes    Partners: Male    Birth control/protection: Post-menopausal, Surgical    Comment: vasectomy  Lifestyle  . Physical activity:    Days per week: Not on file    Minutes per session: Not on file  . Stress: Not on file  Relationships  . Social connections:    Talks on phone: Not on file    Gets together: Not on file    Attends religious service: Not on file    Active member of club or organization: Not on file    Attends meetings of clubs or organizations: Not on file    Relationship status: Not on file  . Intimate partner violence:    Fear of current or ex partner: Not on file    Emotionally abused: Not on file    Physically abused: Not on file    Forced sexual activity: Not on file  Other Topics Concern  . Not on file  Social History Narrative   Patient lives at home with her husband Erica Daugherty). Patient works full time.   Education college   Right handed.   Caffeine sometimes one cup of sweet tea.     PHYSICAL EXAM  Vitals:   03/10/18 0916  BP: 122/79  Pulse: 80  Weight: 237 lb  (107.5 kg)  Height: 5' 6.5" (1.689 m)   Body mass index is 37.68 kg/m. Generalized: In no acute distress, obese female, well-groomed Neck: Supple, no carotid bruits  Cardiac: Regular rate rhythm Pulmonary: Clear to auscultation bilaterally Musculoskeletal: No deformity Skin 2+ edema in the feet and legs  Neurological examination Mentation: Alert oriented to time, place, history taking, and causual conversation Cranial nerve II-XII: Pupils were equal round reactive to light. Extraocular movements were full. Visual field were full on confrontational test. Facial sensation and strength were normal. Hearing was intact to finger rubbing bilaterally. Uvula tongue midline. Head turning and shoulder shrug  and were normal and symmetric.Tongue protrusion into cheek strength was normal. Motor: Normal tone, bulk and strength. No focal weakness Sensory: Length dependent decreased to fine touch, pinprick to midshin level, absent vibratory sensation to ankle level Coordination: Normal finger to nose, heel-to-shin bilaterally there was no truncal ataxia Gait: Rising up from seated position without assistance, normal stance, without trunk ataxia, moderate stride, good arm swing, smooth turning, able to perform tiptoe, and heel walking without difficulty. Tandem gait is normal Deep tendon reflexes: hypoactive and symmetric, absent reflexes at Achilles  plantar responses were flexor bilaterally.  DIAGNOSTIC DATA (LABS, IMAGING, TESTING) - I reviewed patient records, labs, notes, testing and imaging myself where available.  Lab Results  Component Value Date   WBC 8.3 12/03/2017   HGB 12.7 12/03/2017   HCT 39.0 12/03/2017   MCV 92.9 12/03/2017   PLT 306 12/03/2017      Component Value Date/Time   NA 140 12/03/2017 0924   K 3.2 (L) 12/03/2017 0924   CL 102 12/03/2017 0924   CO2 31 12/03/2017 0924   GLUCOSE 81 12/03/2017 0924   BUN 15 12/03/2017 0924   CREATININE 1.03 (H) 12/03/2017 0924    CREATININE 1.02 10/09/2015 1527   CALCIUM 9.3 12/03/2017 0924   PROT 6.5 12/03/2017 0924   PROT 6.6 10/12/2013 1126   ALBUMIN 3.6 12/03/2017 0924   AST 23 12/03/2017 0924   ALT 22 12/03/2017 0924   ALKPHOS 49 12/03/2017 0924   BILITOT 0.8 12/03/2017 0924   GFRNONAA 59 (L) 12/03/2017 0924   GFRAA >60 12/03/2017 0924   Lab Results  Component Value Date   CHOL 162 01/23/2016   HDL 32 (A) 01/23/2016   LDLCALC 119 01/23/2016   LDLDIRECT 157.2 03/26/2013   TRIG 53 01/23/2016   CHOLHDL 3.8 10/09/2015   Lab Results  Component Value Date   HGBA1C 5.5 10/09/2015    Lab Results  Component Value Date   TSH 3.59 01/23/2016      ASSESSMENT AND PLAN  57 y.o. year old female  Peripheral neuropathy   Symptoms overall stable  Previous laboratory evaluation failed to demonstrate treatable etiology  Her bilateral feet swelling also contributed to her complaints of bilateral feet paresthesia and discomfort.  Increase gabapentin to 400 mg every night  Add on gabapentin 100 mg 3 times a day  Emla gel in combination with diclofenac gel to bilateral feet as needed  Continue follow-up with his primary care physician, return to clinic for new issues.  Face to face time was 15 minutes, greater than 50% of the time was spent in counseling and coordination of care with the patient.     Marcial Pacas, M.D. Ph.D.  [facility] [place],  [address] Phone: [phone] Fax:      [phone]

## 2018-03-27 DIAGNOSIS — M6289 Other specified disorders of muscle: Secondary | ICD-10-CM | POA: Diagnosis not present

## 2018-03-27 DIAGNOSIS — N3946 Mixed incontinence: Secondary | ICD-10-CM | POA: Diagnosis not present

## 2018-03-27 DIAGNOSIS — M6281 Muscle weakness (generalized): Secondary | ICD-10-CM | POA: Diagnosis not present

## 2018-04-20 DIAGNOSIS — L821 Other seborrheic keratosis: Secondary | ICD-10-CM | POA: Diagnosis not present

## 2018-04-20 DIAGNOSIS — D2372 Other benign neoplasm of skin of left lower limb, including hip: Secondary | ICD-10-CM | POA: Diagnosis not present

## 2018-04-20 DIAGNOSIS — L573 Poikiloderma of Civatte: Secondary | ICD-10-CM | POA: Diagnosis not present

## 2018-05-05 DIAGNOSIS — M6281 Muscle weakness (generalized): Secondary | ICD-10-CM | POA: Diagnosis not present

## 2018-05-05 DIAGNOSIS — N3946 Mixed incontinence: Secondary | ICD-10-CM | POA: Diagnosis not present

## 2018-05-05 DIAGNOSIS — M6289 Other specified disorders of muscle: Secondary | ICD-10-CM | POA: Diagnosis not present

## 2018-06-02 DIAGNOSIS — E782 Mixed hyperlipidemia: Secondary | ICD-10-CM | POA: Diagnosis not present

## 2018-06-02 DIAGNOSIS — E559 Vitamin D deficiency, unspecified: Secondary | ICD-10-CM | POA: Diagnosis not present

## 2018-06-02 DIAGNOSIS — M6289 Other specified disorders of muscle: Secondary | ICD-10-CM | POA: Diagnosis not present

## 2018-06-02 DIAGNOSIS — N3946 Mixed incontinence: Secondary | ICD-10-CM | POA: Diagnosis not present

## 2018-06-02 DIAGNOSIS — M6281 Muscle weakness (generalized): Secondary | ICD-10-CM | POA: Diagnosis not present

## 2018-06-04 DIAGNOSIS — R351 Nocturia: Secondary | ICD-10-CM | POA: Diagnosis not present

## 2018-06-04 DIAGNOSIS — N3946 Mixed incontinence: Secondary | ICD-10-CM | POA: Diagnosis not present

## 2018-06-05 DIAGNOSIS — E559 Vitamin D deficiency, unspecified: Secondary | ICD-10-CM | POA: Diagnosis not present

## 2018-06-05 DIAGNOSIS — E782 Mixed hyperlipidemia: Secondary | ICD-10-CM | POA: Diagnosis not present

## 2018-06-05 DIAGNOSIS — Z6837 Body mass index (BMI) 37.0-37.9, adult: Secondary | ICD-10-CM | POA: Diagnosis not present

## 2018-08-28 DIAGNOSIS — Z79890 Hormone replacement therapy (postmenopausal): Secondary | ICD-10-CM | POA: Diagnosis not present

## 2018-08-28 DIAGNOSIS — I10 Essential (primary) hypertension: Secondary | ICD-10-CM | POA: Diagnosis not present

## 2018-08-28 DIAGNOSIS — Z719 Counseling, unspecified: Secondary | ICD-10-CM | POA: Diagnosis not present

## 2019-02-02 ENCOUNTER — Ambulatory Visit: Payer: 59 | Admitting: Obstetrics & Gynecology

## 2019-03-17 ENCOUNTER — Other Ambulatory Visit: Payer: Self-pay | Admitting: Neurology

## 2019-04-12 ENCOUNTER — Other Ambulatory Visit: Payer: Self-pay | Admitting: Neurology

## 2019-04-20 ENCOUNTER — Ambulatory Visit (HOSPITAL_COMMUNITY)
Admission: EM | Admit: 2019-04-20 | Discharge: 2019-04-20 | Disposition: A | Discharge status: Home / Self Care | Payer: 59 | Attending: Family Medicine | Admitting: Family Medicine

## 2019-04-20 ENCOUNTER — Encounter (HOSPITAL_COMMUNITY): Payer: Self-pay

## 2019-04-20 ENCOUNTER — Other Ambulatory Visit: Payer: Self-pay

## 2019-04-20 DIAGNOSIS — Z9049 Acquired absence of other specified parts of digestive tract: Secondary | ICD-10-CM | POA: Diagnosis not present

## 2019-04-20 DIAGNOSIS — Z8049 Family history of malignant neoplasm of other genital organs: Secondary | ICD-10-CM | POA: Diagnosis not present

## 2019-04-20 DIAGNOSIS — U071 COVID-19: Secondary | ICD-10-CM | POA: Insufficient documentation

## 2019-04-20 DIAGNOSIS — R432 Parageusia: Secondary | ICD-10-CM | POA: Diagnosis present

## 2019-04-20 DIAGNOSIS — R05 Cough: Secondary | ICD-10-CM | POA: Diagnosis present

## 2019-04-20 DIAGNOSIS — R519 Headache, unspecified: Secondary | ICD-10-CM

## 2019-04-20 DIAGNOSIS — Z8042 Family history of malignant neoplasm of prostate: Secondary | ICD-10-CM | POA: Diagnosis not present

## 2019-04-20 DIAGNOSIS — Z833 Family history of diabetes mellitus: Secondary | ICD-10-CM | POA: Insufficient documentation

## 2019-04-20 DIAGNOSIS — I10 Essential (primary) hypertension: Secondary | ICD-10-CM | POA: Insufficient documentation

## 2019-04-20 DIAGNOSIS — R059 Cough, unspecified: Secondary | ICD-10-CM

## 2019-04-20 DIAGNOSIS — Z8249 Family history of ischemic heart disease and other diseases of the circulatory system: Secondary | ICD-10-CM | POA: Insufficient documentation

## 2019-04-20 NOTE — Discharge Instructions (Signed)
If your Covid-19 test is positive, you will receive a phone call from [facility] regarding your results. Negative test results are not called. Both positive and negative results area always visible on MyChart. If you do not have a MyChart account, sign up instructions are in your discharge papers.  

## 2019-04-20 NOTE — ED Triage Notes (Signed)
Pt c/o 2/2 "cold symptoms" I.e. sneezing, runny nose, cough, congestion, headache x2 days. Loss of taste today. States max temp 99.5 at home. Denies SOB, n/v/d.

## 2019-04-21 NOTE — ED Provider Notes (Signed)
[place]   CR:[id number] 04/20/19 Arrival Time: [id number]  ASSESSMENT & PLAN:  1. Cough   2. Nonintractable headache, unspecified chronicity pattern, unspecified headache type   3. Loss of taste      COVID-19 testing sent. See work note for [employer].  Follow-up Information    Fanny Bien, MD.   Specialty: Family Medicine Why: As needed. Contact information: [address] [place] [address] [phone]           Reviewed expectations re: course of current medical issues. Questions answered. Outlined signs and symptoms indicating need for more acute intervention. Patient verbalized understanding. After Visit Summary given.   SUBJECTIVE: History from: patient. Erica Daugherty is a 58 y.o. female who requests COVID-19 testing. Known COVID-19 contact: none. Recent travel: none. Reports runny nose, sneezing, mild coughing, nasal congestion, and mild headache for two days. Also loss of taste. Fairly abrupt onset. Temp 99.5 degrees F measured at home. Denies: difficulty breathing. Normal PO intake without n/v/d.  ROS: As per HPI.   OBJECTIVE:  Vitals:   04/20/19 1715  BP: 132/86  Pulse: 82  Resp: 18  Temp: 98.5 F (36.9 C)  TempSrc: Oral  SpO2: 99%    General appearance: alert; no distress Eyes: PERRLA; EOMI; conjunctiva normal HENT: [place]; AT; mild nasal congestion Neck: supple  Lungs: speaks full sentences without difficulty; unlabored Heart: regular rate and rhythm Abdomen: soft, non-tender Extremities: no edema Skin: warm and dry Neurologic: normal gait Psychological: alert and cooperative; normal mood and affect  Labs:  Labs Reviewed  NOVEL CORONAVIRUS, NAA (HOSP ORDER, SEND-OUT TO REF LAB; TAT 18-24 HRS)      No Known Allergies  Past Medical History:  Diagnosis Date  . Cholelithiasis with chronic cholecystitis without biliary obstruction 12/12/2017  . Gallstones   . History of decreased platelet count    . Hyperlipidemia   . Hypertension   . Incarcerated umbilical hernia 0000000  . Umbilical hernia    Social History   Socioeconomic History  . Marital status: Married    Spouse name: [name]  . Number of children: 4  . Years of education: college  . Highest education level: Not on file  Occupational History  . Occupation: Pharmacist, hospital: [employer]  Tobacco Use  . Smoking status: Never Smoker  . Smokeless tobacco: Never Used  Substance and Sexual Activity  . Alcohol use: Yes    Comment: occ  . Drug use: No  . Sexual activity: Yes    Partners: Male    Birth control/protection: Post-menopausal, Surgical    Comment: vasectomy  Other Topics Concern  . Not on file  Social History Narrative   Patient lives at home with her husband [name]). Patient works full time.   Education college   Right handed.   Caffeine sometimes one cup of sweet tea.   Social Determinants of Health   Financial Resource Strain:   . Difficulty of Paying Living Expenses: Not on file  Food Insecurity:   . Worried About Charity fundraiser in the Last Year: Not on file  . Ran Out of Food in the Last Year: Not on file  Transportation Needs:   . Lack of Transportation (Medical): Not on file  . Lack of Transportation (Non-Medical): Not on file  Physical Activity:   . Days of Exercise per Week: Not on file  . Minutes of Exercise per Session: Not on file  Stress:   .  Feeling of Stress : Not on file  Social Connections:   . Frequency of Communication with Friends and Family: Not on file  . Frequency of Social Gatherings with Friends and Family: Not on file  . Attends Religious Services: Not on file  . Active Member of Clubs or Organizations: Not on file  . Attends Archivist Meetings: Not on file  . Marital Status: Not on file  Intimate Partner Violence:   . Fear of Current or Ex-Partner: Not on file  . Emotionally Abused: Not on file  . Physically Abused: Not on  file  . Sexually Abused: Not on file   Family History  Problem Relation Age of Onset  . Diabetes Mother   . Hyperlipidemia Mother   . Hypertension Mother   . Thyroid nodules Mother   . Testicular cancer Brother 57  . Diabetes Brother   . Prostate cancer Father 70  . Stroke Neg Hx   . Heart disease Neg Hx   . Colon cancer Neg Hx   . Stomach cancer Neg Hx    Past Surgical History:  Procedure Laterality Date  . CHOLECYSTECTOMY N/A 12/12/2017   Procedure: LAPAROSCOPIC CHOLECYSTECTOMY WITH INTRAOPERATIVE CHOLANGIOGRAM ERAS PATHWAY;  Surgeon: Fanny Skates, MD;  Location: [facility];  Service: General;  Laterality: N/A;  . EYE SURGERY  1964   lazy eye  . no colonoscopy     03/26/13  & 04/28/14 SOC reviewed  . SHOULDER SURGERY  2012   Left ; Dr Onnie Graham  . UMBILICAL HERNIA REPAIR N/A 12/12/2017   Procedure: REPAIR UMBILICAL HERNIA;  Surgeon: Fanny Skates, MD;  Location: [facility];  Service: General;  Laterality: Ginette Pitman, MD 04/21/19 [phone]

## 2019-04-22 ENCOUNTER — Telehealth (HOSPITAL_COMMUNITY): Payer: Self-pay | Admitting: Emergency Medicine

## 2019-04-22 LAB — NOVEL CORONAVIRUS, NAA (HOSP ORDER, SEND-OUT TO REF LAB; TAT 18-24 HRS): SARS-CoV-2, NAA: DETECTED — AB

## 2019-04-22 NOTE — Telephone Encounter (Signed)

## 2019-06-10 ENCOUNTER — Other Ambulatory Visit: Payer: Self-pay | Admitting: Neurology

## 2019-06-29 ENCOUNTER — Ambulatory Visit: Payer: 59 | Admitting: Neurology

## 2019-07-15 ENCOUNTER — Other Ambulatory Visit: Payer: Self-pay | Admitting: Neurology

## 2019-07-16 ENCOUNTER — Other Ambulatory Visit: Payer: Self-pay | Admitting: Neurology

## 2019-08-05 ENCOUNTER — Other Ambulatory Visit: Payer: Self-pay

## 2019-08-05 ENCOUNTER — Encounter: Payer: Self-pay | Admitting: Neurology

## 2019-08-05 ENCOUNTER — Ambulatory Visit (INDEPENDENT_AMBULATORY_CARE_PROVIDER_SITE_OTHER): Payer: 59 | Admitting: Neurology

## 2019-08-05 VITALS — BP 132/72 | HR 76 | Temp 97.7°F | Ht 66.5 in | Wt 238.0 lb

## 2019-08-05 DIAGNOSIS — G629 Polyneuropathy, unspecified: Secondary | ICD-10-CM

## 2019-08-05 DIAGNOSIS — M792 Neuralgia and neuritis, unspecified: Secondary | ICD-10-CM | POA: Diagnosis not present

## 2019-08-05 MED ORDER — GABAPENTIN 400 MG PO CAPS: 800.0000 mg | ORAL_CAPSULE | Freq: Every day | ORAL | 6 refills | Status: DC

## 2019-08-05 MED ORDER — ROPINIROLE HCL 0.5 MG PO TABS: 1.0000 mg | ORAL_TABLET | Freq: Every day | ORAL | 11 refills | Status: DC

## 2019-08-05 NOTE — Progress Notes (Signed)
[facility]  PATIENT: Erica Daugherty DOB: [id number]  HISTORY OF PRESENT ILLNESS: TABER SANKEY is a 59 years old right-handed Caucasian female, referred by her primary care physician Dr. Unice Cobble, and pain management Dr. 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 She complains of 6 months history of bilateral feet paresthesia, at plantar surface, pad, numbness, burning sensation, needle prick, She denies weakness, getting sore after weight bearing. She does have low back pain, going down her legs. She denies gait difficulty, no fingers paresthesia, no incontinence, Laboratory in December 2014, which has demonstrates normal CMP, CBC, TSH, LDL was elevated 152  UPDATE July 9th 2015:She is taking Gabapentin 100mg  bid, which does help her symptoms, Labs showed normal [id number], folic acid, RPR, mild elevated A1c 5.7, normal electrophoresis.   UPDATE Mar 10 2018: She was seen by NP Hoyle Sauer over past few years, she continues to complains of plantar feet swollen, was seen by vein specialist, She denies low back pain, painful to walk, she is now see urologist for urinary incontinence,   She was taking gabapentin 400 mg every night, which has been helpful, symptomatic during the day, gabapentin makes her very sleepy,  UPDATE August 05 2019: Over the years, Erica Daugherty continue complains of bilateral feet burning pain, especially late afternoon and nighttime, burning painful, difficulty sleeping, she often has to rubbing her feet together or using her hands to wrapping her lower extremity to ease up the discomfort.  She has been taking gabapentin 400 mg +100 mg 3 tablets every night with partial relief of her symptoms, she could not tolerate daytime dosage due to significant sleepiness  She denies  upper extremity involvement, denies gait abnormality, has urinary urgency, but denies significant neck, low back pain.  REVIEW OF SYSTEMS: Full 14 system review of systems performed and notable only for those listed, all others are neg:   ALLERGIES: No Known Allergies  HOME MEDICATIONS: Outpatient Medications Prior to Visit  Medication Sig Dispense Refill  . chlorthalidone (HYGROTON) 25 MG tablet Take 25 mg by mouth daily.     . Cholecalciferol (VITAMIN D PO) Take 5,000 Int'l Units/day by mouth.    . Cyanocobalamin 2500 MCG TABS Take 2,500 mcg by mouth daily.     . diclofenac sodium (VOLTAREN) 1 % GEL Apply 4 g topically 4 (four) times daily. 100 g 11  . estradiol (ESTRACE) 0.5 MG tablet 1/2 tab daily (Patient taking differently: Take 0.25 mg by mouth daily. 1/2 tab daily) 45 tablet 4  . gabapentin (NEURONTIN) 100 MG capsule Take one cap TID. Must keep appt on 08/05/19 for further refills. 90 capsule 0  . gabapentin (NEURONTIN) 400 MG capsule Take 1 capsule (400 mg total) by mouth at bedtime. Please call [phone] to schedule follow up or may request from PCP. 30 capsule 2  . lidocaine-prilocaine (EMLA) cream Apply 1 application topically as needed. 30 g 3  . medroxyPROGESTERone (PROVERA) 2.5 MG tablet 1 tab daily, days 1-15 each month 45 tablet 4  . mirabegron ER (MYRBETRIQ) 50 MG TB24 tablet Take 50 mg by mouth daily.    Marland Kitchen olmesartan (BENICAR) 20 MG tablet Take 20 mg by mouth daily.     . rosuvastatin (CRESTOR) 5 MG tablet Take 5 mg by mouth daily.     No facility-administered medications prior to visit.  PAST MEDICAL HISTORY: Past Medical History:  Diagnosis Date  . Cholelithiasis with chronic cholecystitis without biliary obstruction 12/12/2017  . Gallstones   . History of decreased platelet count   . Hyperlipidemia   . Hypertension   . Incarcerated umbilical hernia 0000000  . Umbilical hernia     PAST SURGICAL HISTORY: Past Surgical History:  Procedure Laterality Date   . CHOLECYSTECTOMY N/A 12/12/2017   Procedure: LAPAROSCOPIC CHOLECYSTECTOMY WITH INTRAOPERATIVE CHOLANGIOGRAM ERAS PATHWAY;  Surgeon: Fanny Skates, MD;  Location: [facility];  Service: General;  Laterality: N/A;  . EYE SURGERY  1964   lazy eye  . no colonoscopy     03/26/13  & 04/28/14 SOC reviewed  . SHOULDER SURGERY  2012   Left ; Dr Onnie Graham  . UMBILICAL HERNIA REPAIR N/A 12/12/2017   Procedure: REPAIR UMBILICAL HERNIA;  Surgeon: Fanny Skates, MD;  Location: [facility];  Service: General;  Laterality: N/A;    FAMILY HISTORY: Family History  Problem Relation Age of Onset  . Diabetes Mother   . Hyperlipidemia Mother   . Hypertension Mother   . Thyroid nodules Mother   . Testicular cancer Brother 43  . Diabetes Brother   . Prostate cancer Father 73  . Stroke Neg Hx   . Heart disease Neg Hx   . Colon cancer Neg Hx   . Stomach cancer Neg Hx     SOCIAL HISTORY: Social History   Socioeconomic History  . Marital status: Married    Spouse name: [name]  . Number of children: 4  . Years of education: college  . Highest education level: Not on file  Occupational History  . Occupation: Pharmacist, hospital: SELF EMPLOYED  Tobacco Use  . Smoking status: Never Smoker  . Smokeless tobacco: Never Used  Substance and Sexual Activity  . Alcohol use: Yes    Comment: occ  . Drug use: No  . Sexual activity: Yes    Partners: Male    Birth control/protection: Post-menopausal, Surgical    Comment: vasectomy  Other Topics Concern  . Not on file  Social History Narrative   Patient lives at home with her husband [name]). Patient works full time.   Education college   Right handed.   Caffeine sometimes one cup of sweet tea.   Social Determinants of Health   Financial Resource Strain:   . Difficulty of Paying Living Expenses:   Food Insecurity:   . Worried About Charity fundraiser in the Last Year:   . Arboriculturist in the Last Year:   Transportation Needs:   . Consulting civil engineer (Medical):   Marland Kitchen Lack of Transportation (Non-Medical):   Physical Activity:   . Days of Exercise per Week:   . Minutes of Exercise per Session:   Stress:   . Feeling of Stress :   Social Connections:   . Frequency of Communication with Friends and Family:   . Frequency of Social Gatherings with Friends and Family:   . Attends Religious Services:   . Active Member of Clubs or Organizations:   . Attends Archivist Meetings:   Marland Kitchen Marital Status:   Intimate Partner Violence:   . Fear of Current or Ex-Partner:   . Emotionally Abused:   Marland Kitchen Physically Abused:   . Sexually Abused:      PHYSICAL EXAM  Vitals:   08/05/19 1144  BP: 132/72  Pulse: 76  Temp: 97.7 F (36.5 C)  Weight: 238 lb (108  kg)  Height: 5' 6.5" (1.689 m)   Body mass index is 37.84 kg/m.  PHYSICAL EXAMNIATION:  Gen: NAD, conversant, well nourised, well groomed                     Cardiovascular: Regular rate rhythm, no peripheral edema, warm, nontender. Eyes: Conjunctivae clear without exudates or hemorrhage Neck: Supple, no carotid bruits. Pulmonary: Clear to auscultation bilaterally   NEUROLOGICAL EXAM:  MENTAL STATUS: Speech:    Speech is normal; fluent and spontaneous with normal comprehension.  Cognition:     Orientation to time, place and person     Normal recent and remote memory     Normal Attention span and concentration     Normal Language, naming, repeating,spontaneous speech     Fund of knowledge   CRANIAL NERVES: CN II: Visual fields are full to confrontation.  Pupils are round equal and briskly reactive to light. CN III, IV, VI: extraocular movement are normal. No ptosis. CN V: Facial sensation is intact to pinprick in all 3 divisions bilaterally. Corneal responses are intact.  CN VII: Face is symmetric with normal eye closure and smile. CN VIII: Hearing is normal to casual conversation CN IX, X: Palate elevates symmetrically. Phonation is normal. CN XI: Head  turning and shoulder shrug are intact CN XII: Tongue is midline with normal movements and no atrophy.  MOTOR: There is no pronator drift of out-stretched arms. Muscle bulk and tone are normal. Muscle strength is normal.  REFLEXES: Reflexes are hypoactive and symmetric, Plantar responses are flexor.  SENSORY: Intact to light touch, pinprick, positional and vibratory sensation are intact in fingers and toes.  COORDINATION: Rapid alternating movements and fine finger movements are intact. There is no dysmetria on finger-to-nose and heel-knee-shin.    GAIT/STANCE: Posture is normal. Gait is steady with normal steps, base, arm swing, and turning. Heel and toe walking are normal. Tandem gait is normal.  Romberg is absent.    DIAGNOSTIC DATA (LABS, IMAGING, TESTING) - I reviewed patient records, labs, notes, testing and imaging myself where available.  Lab Results  Component Value Date   WBC 8.3 12/03/2017   HGB 12.7 12/03/2017   HCT 39.0 12/03/2017   MCV 92.9 12/03/2017   PLT 306 12/03/2017      Component Value Date/Time   NA 140 12/03/2017 0924   K 3.2 (L) 12/03/2017 0924   CL 102 12/03/2017 0924   CO2 31 12/03/2017 0924   GLUCOSE 81 12/03/2017 0924   BUN 15 12/03/2017 0924   CREATININE 1.03 (H) 12/03/2017 0924   CREATININE 1.02 10/09/2015 1527   CALCIUM 9.3 12/03/2017 0924   PROT 6.5 12/03/2017 0924   PROT 6.6 10/12/2013 1126   ALBUMIN 3.6 12/03/2017 0924   AST 23 12/03/2017 0924   ALT 22 12/03/2017 0924   ALKPHOS 49 12/03/2017 0924   BILITOT 0.8 12/03/2017 0924   GFRNONAA 59 (L) 12/03/2017 0924   GFRAA >60 12/03/2017 0924   Lab Results  Component Value Date   CHOL 162 01/23/2016   HDL 32 (A) 01/23/2016   LDLCALC 119 01/23/2016   LDLDIRECT 157.2 03/26/2013   TRIG 53 01/23/2016   CHOLHDL 3.8 10/09/2015   Lab Results  Component Value Date   HGBA1C 5.5 10/09/2015    Lab Results  Component Value Date   TSH 3.59 01/23/2016      ASSESSMENT AND  PLAN  59 y.o. year old female  Peripheral neuropathy   That was confirmed by previous EMG  nerve conduction study in June 2015, which showed absent bilateral sural, superficial peroneal sensory response, decreased bilateral tibial motor responses,  Gradual worsening symptoms, required titrating dose of gabapentin, symptoms also suggestive for restless leg symptoms,  Previous laboratory evaluation showed no treatable etiology in 2016   Repeat EMG nerve conduction study, laboratory evaluations, including ferritin level  Increase gabapentin to 400 mg 2 tablets every night, add on low-dose Requip 0.5 mg titrating to 2 tablets every night  Other options are Lyrica,       Marcial Pacas, M.D. Ph.D.  [facility] [place], [place] [address] Phone: [phone] Fax:      [phone]

## 2019-08-10 LAB — CBC WITH DIFFERENTIAL/PLATELET
Basophils Absolute: 0 10*3/uL (ref 0.0–0.2)
Basos: 1 %
EOS (ABSOLUTE): 0.2 10*3/uL (ref 0.0–0.4)
Eos: 2 %
Hematocrit: 38.5 % (ref 34.0–46.6)
Hemoglobin: 13 g/dL (ref 11.1–15.9)
Immature Grans (Abs): 0 10*3/uL (ref 0.0–0.1)
Immature Granulocytes: 0 %
Lymphocytes Absolute: 2 10*3/uL (ref 0.7–3.1)
Lymphs: 28 %
MCH: 30 pg (ref 26.6–33.0)
MCHC: 33.8 g/dL (ref 31.5–35.7)
MCV: 89 fL (ref 79–97)
Monocytes Absolute: 0.6 10*3/uL (ref 0.1–0.9)
Monocytes: 9 %
Neutrophils Absolute: 4.5 10*3/uL (ref 1.4–7.0)
Neutrophils: 60 %
Platelets: 237 10*3/uL (ref 150–450)
RBC: 4.33 x10E6/uL (ref 3.77–5.28)
RDW: 12.1 % (ref 11.7–15.4)
WBC: 7.3 10*3/uL (ref 3.4–10.8)

## 2019-08-10 LAB — COMPREHENSIVE METABOLIC PANEL
ALT: 17 IU/L (ref 0–32)
AST: 18 IU/L (ref 0–40)
Albumin/Globulin Ratio: 1.4 (ref 1.2–2.2)
Albumin: 3.9 g/dL (ref 3.8–4.9)
Alkaline Phosphatase: 57 IU/L (ref 39–117)
BUN/Creatinine Ratio: 17 (ref 9–23)
BUN: 18 mg/dL (ref 6–24)
Bilirubin Total: 0.3 mg/dL (ref 0.0–1.2)
CO2: 29 mmol/L (ref 20–29)
Calcium: 9.8 mg/dL (ref 8.7–10.2)
Chloride: 99 mmol/L (ref 96–106)
Creatinine, Ser: 1.07 mg/dL — ABNORMAL HIGH (ref 0.57–1.00)
GFR calc Af Amer: 66 mL/min/{1.73_m2} (ref >59)
GFR calc non Af Amer: 57 mL/min/{1.73_m2} — ABNORMAL LOW (ref >59)
Globulin, Total: 2.8 g/dL (ref 1.5–4.5)
Glucose: 101 mg/dL — ABNORMAL HIGH (ref 65–99)
Potassium: 3.5 mmol/L (ref 3.5–5.2)
Sodium: 141 mmol/L (ref 134–144)
Total Protein: 6.7 g/dL (ref 6.0–8.5)

## 2019-08-10 LAB — MULTIPLE MYELOMA PANEL, SERUM
Albumin SerPl Elph-Mcnc: 3.8 g/dL (ref 2.9–4.4)
Albumin/Glob SerPl: 1.4 (ref 0.7–1.7)
Alpha 1: 0.2 g/dL (ref 0.0–0.4)
Alpha2 Glob SerPl Elph-Mcnc: 0.6 g/dL (ref 0.4–1.0)
B-Globulin SerPl Elph-Mcnc: 1.2 g/dL (ref 0.7–1.3)
Gamma Glob SerPl Elph-Mcnc: 0.9 g/dL (ref 0.4–1.8)
Globulin, Total: 2.9 g/dL (ref 2.2–3.9)
IgA/Immunoglobulin A, Serum: 385 mg/dL — ABNORMAL HIGH (ref 87–352)
IgG (Immunoglobin G), Serum: 978 mg/dL (ref 586–1602)
IgM (Immunoglobulin M), Srm: 36 mg/dL (ref 26–217)

## 2019-08-10 LAB — C-REACTIVE PROTEIN: CRP: 6 mg/L (ref 0–10)

## 2019-08-10 LAB — FOLATE: Folate: 12.9 ng/mL (ref >3.0)

## 2019-08-10 LAB — ANA W/REFLEX IF POSITIVE: Anti Nuclear Antibody (ANA): NEGATIVE

## 2019-08-10 LAB — CK: Total CK: 102 U/L (ref 32–182)

## 2019-08-10 LAB — HGB A1C W/O EAG: Hgb A1c MFr Bld: 5.7 % — ABNORMAL HIGH (ref 4.8–5.6)

## 2019-08-10 LAB — B. BURGDORFI ANTIBODIES: Lyme IgG/IgM Ab: <0.91 {ISR} (ref 0.00–0.90)

## 2019-08-10 LAB — COPPER, SERUM: Copper: 126 ug/dL (ref 80–158)

## 2019-08-10 LAB — VITAMIN B12: Vitamin B-12: >2000 pg/mL — ABNORMAL HIGH (ref 232–1245)

## 2019-08-10 LAB — SEDIMENTATION RATE: Sed Rate: 8 mm/hr (ref 0–40)

## 2019-08-10 LAB — FERRITIN: Ferritin: 228 ng/mL — ABNORMAL HIGH (ref 15–150)

## 2019-08-10 LAB — HIV ANTIBODY (ROUTINE TESTING W REFLEX): HIV Screen 4th Generation wRfx: NONREACTIVE

## 2019-08-10 LAB — RPR: RPR Ser Ql: NONREACTIVE

## 2019-08-10 LAB — TSH: TSH: 1.84 u[IU]/mL (ref 0.450–4.500)

## 2019-08-10 LAB — VITAMIN D 25 HYDROXY (VIT D DEFICIENCY, FRACTURES): Vit D, 25-Hydroxy: 91.3 ng/mL (ref 30.0–100.0)

## 2019-08-12 ENCOUNTER — Other Ambulatory Visit: Payer: Self-pay | Admitting: Neurology

## 2019-08-30 ENCOUNTER — Ambulatory Visit (INDEPENDENT_AMBULATORY_CARE_PROVIDER_SITE_OTHER): Payer: 59 | Admitting: Neurology

## 2019-08-30 ENCOUNTER — Other Ambulatory Visit: Payer: Self-pay

## 2019-08-30 DIAGNOSIS — G6289 Other specified polyneuropathies: Secondary | ICD-10-CM

## 2019-08-30 DIAGNOSIS — M792 Neuralgia and neuritis, unspecified: Secondary | ICD-10-CM

## 2019-08-30 DIAGNOSIS — R269 Unspecified abnormalities of gait and mobility: Secondary | ICD-10-CM | POA: Insufficient documentation

## 2019-08-30 DIAGNOSIS — R202 Paresthesia of skin: Secondary | ICD-10-CM

## 2019-08-30 MED ORDER — DULOXETINE HCL 60 MG PO CPEP: 60.0000 mg | ORAL_CAPSULE | Freq: Every day | ORAL | 12 refills | Status: DC

## 2019-08-30 NOTE — Procedures (Signed)
Full Name: Erica Daugherty Gender: Female MRN #: [id number] Date of Birth: July 26, 1960    Visit Date: 08/30/2019 09:45 Age: 59 Years Examining Physician: Marcial Pacas, MD  Referring Physician: Marcial Pacas, MD Height: 5 feet 6 inch History:   59 year old female, presented with gradual onset bilateral feet paresthesia, mild unsteady gait  Summary of the tests:  Nerve conduction study:  Bilateral sural, superficial peroneal sensory responses were absent.  Bilateral tibial, peroneal motor responses showed moderate to significantly decreased CMAP amplitude.  Bilateral tibial F-wave latency was prolonged, there was also evidence of temporal dispersion of bilateral tibial motor response at proximal stimulation site in the setting of decreased CMAP amplitude.  Left ulnar, radial sensory response showed normal distal latency, with mild to moderately decreased CMAP amplitude.  Left ulnar motor responses showed mildly prolonged F-wave latency  Electromyography:  Selected needle examination of bilateral lower extremity muscles, and bilateral lumbosacral paraspinal muscles was performed.  There was evidence of mildly increased insertional activity, mildly decreased recruitment patterns at bilateral tibialis anterior, medial gastrocnemius, tibialis posterior.  There was no spontaneous activity at bilateral lumbosacral paraspinal muscles.   Conclusion:  This is an abnormal study.  There is electrodiagnostic evidence of mixed axonal and demyelinating neuropathy, as evident by prolonged F-wave latency at bilateral tibial motor response, and the left ulnar motor response.   ------------------------------- Marcial Pacas, M.D. PhD,  [facility] [address] [place],  [address] Tel: [phone] Fax: [phone]  Verbal informed consent was obtained from the patient, patient was informed of potential risk of procedure, including bruising, bleeding, hematoma  formation, infection, muscle weakness, muscle pain, numbness, among others.        Reed    Nerve / Sites Muscle Latency Ref. Amplitude Ref. Rel Amp Segments Distance Velocity Ref. Area    ms ms mV mV %  cm m/s m/s mVms  L Ulnar - ADM     Wrist ADM 3.1 ?3.3 6.9 ?6.0 100 Wrist - ADM 7   24.9     B.Elbow ADM 6.5  5.5  79.8 B.Elbow - Wrist 21 61 ?49 29.6     A.Elbow ADM 8.2  5.2  94.8 A.Elbow - B.Elbow 10 61 ?49 23.1  L Peroneal - EDB     Ankle EDB NR ?6.5 NR ?2.0 NR Ankle - EDB 9   NR     Fib head EDB 13.3  1.1   Fib head - Ankle 28 NR ?44 4.0     Pop fossa EDB 15.1  1.0  96.2 Pop fossa - Fib head 10 54 ?44 3.9         Pop fossa - Ankle      R Peroneal - EDB     Ankle EDB 6.0 ?6.5 0.9 ?2.0 100 Ankle - EDB 9   2.2     Fib head EDB 13.1  0.9  106 Fib head - Ankle 27 38 ?44 2.1     Pop fossa EDB 15.7  0.9  93.5 Pop fossa - Fib head 10 39 ?44 1.7         Pop fossa - Ankle      L Tibial - AH     Ankle AH 5.1 ?5.8 0.2 ?4.0 100 Ankle - AH 9   0.7     Pop fossa AH 15.8  0.4  205 Pop fossa - Ankle 38 35 ?41 1.4  R Tibial - AH     Ankle  AH 5.3 ?5.8 1.1 ?4.0 100 Ankle - AH 9   3.4     Pop fossa AH 16.6  0.1  9.41 Pop fossa - Ankle 38 34 ?41 0.5               SNC    Nerve / Sites Rec. Site Peak Lat Ref.  Amp Ref. Segments Distance    ms ms V V  cm  L Radial - Anatomical snuff box (Forearm)     Forearm Wrist 2.3 ?2.9 10 ?15 Forearm - Wrist 10  L Sural - Ankle (Calf)     Calf Ankle NR ?4.4 NR ?6 Calf - Ankle 14  R Sural - Ankle (Calf)     Calf Ankle NR ?4.4 NR ?6 Calf - Ankle 14  L Superficial peroneal - Ankle     Lat leg Ankle NR ?4.4 NR ?6 Lat leg - Ankle 14  R Superficial peroneal - Ankle     Lat leg Ankle NR ?4.4 NR ?6 Lat leg - Ankle 14  L Ulnar - Orthodromic, (Dig V, Mid palm)     Dig V Wrist 2.9 ?3.1 3 ?5 Dig V - Wrist 59                 F  Wave    Nerve F Lat Ref.   ms ms  L Tibial - AH 58.3 ?56.0  R Tibial - AH 64.2 ?56.0  L Ulnar - ADM 32.5 ?32.0           EMG Summary  Table    Spontaneous MUAP Recruitment  Muscle IA Fib PSW Fasc Other Amp Dur. Poly Pattern  L. Tibialis anterior Increased None None None _______ Normal Normal Normal Normal  L. Tibialis posterior Normal None None None _______ Normal Normal Normal Reduced  L. Peroneus longus Normal None None None _______ Normal Normal Normal Reduced  L. Vastus lateralis Normal None None None _______ Normal Normal Normal Normal  L. Abductor hallucis Increased None None None _______ Normal Normal Normal Reduced  R. Tibialis anterior Normal None None None _______ Normal Normal Normal Reduced  R. Tibialis posterior Normal None None None _______ Normal Normal Normal Reduced  R. Gastrocnemius (Medial head) Normal None None None _______ Normal Normal Normal Reduced  R. Vastus lateralis Normal None None None _______ Normal Normal Normal Normal  R. Lumbar paraspinals (mid) Normal None None None _______ Normal Normal Normal Normal  R. Lumbar paraspinals (low) Normal None None None _______ Normal Normal Normal Normal  L. Lumbar paraspinals (low) Normal None None None _______ Normal Normal Normal Normal  L. Lumbar paraspinals (mid) Normal None None None _______ Normal Normal Normal Normal

## 2019-08-30 NOTE — Progress Notes (Signed)
[facility]  PATIENT: Erica Daugherty DOB: [birth year]  HISTORY OF PRESENT ILLNESS: Erica Daugherty is a 59 years old right-handed Caucasian female, referred by her primary care physician Dr. Unice Cobble, and pain management Dr. 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 She complains of 6 months history of bilateral feet paresthesia, at plantar surface, pad, numbness, burning sensation, needle prick, She denies weakness, getting sore after weight bearing. She does have low back pain, going down her legs. She denies gait difficulty, no fingers paresthesia, no incontinence, Laboratory in December 2014, which has demonstrates normal CMP, CBC, TSH, LDL was elevated 152  UPDATE July 9th 2015:She is taking Gabapentin '100mg'$  bid, which does help her symptoms, Labs showed normal X21, folic acid, RPR, mild elevated A1c 5.7, normal electrophoresis.   UPDATE Mar 10 2018: She was seen by NP Hoyle Sauer over past few years, she continues to complains of plantar feet swollen, was seen by vein specialist, She denies low back pain, painful to walk, she is now see urologist for urinary incontinence,   She was taking gabapentin 400 mg every night, which has been helpful, symptomatic during the day, gabapentin makes her very sleepy,  UPDATE August 05 2019: Over the years, Erica Daugherty continue complains of bilateral feet burning pain, especially late afternoon and nighttime, burning painful, difficulty sleeping, she often has to rubbing her feet together or using her hands to wrapping her lower extremity to ease up the discomfort.  She has been taking gabapentin 400 mg +100 mg 3 tablets every night with partial relief of her symptoms, she could not tolerate daytime dosage due to significant sleepiness  She denies  upper extremity involvement, denies gait abnormality, has urinary urgency, but denies significant neck, low back pain.  UPDATE Aug 30 2019: Patient return for electrodiagnostic study today, which confirmed the diagnosis of chronic neuropathy, there is evidence of mixed demyelinating and axonal features, as evident by prolonged F-wave latency at bilateral tibial, and the left ulnar motor response, there is also evidence of temporal dispersion at bilateral tibial motor responses, in the setting of decreased CMAP amplitude.  On examination, she was areflexia, decreased vibratory sensation to mid shin level, absent toe proprioception, positive Romberg signs, mild bilateral toe flexion, extension weakness  Extensive laboratory evaluations showed no treatable etiology, mild elevated A1c 5.7, normal B12, copper, vitamin D, ESR, CPK, TSH, folic acid, C-reactive protein, RPR, HIV, CBC, CMP, ANA, ferritin, protein electrophoresis,  REVIEW OF SYSTEMS: Full 14 system review of systems performed and notable only for those listed, all others are neg:   ALLERGIES: No Known Allergies  HOME MEDICATIONS: Outpatient Medications Prior to Visit  Medication Sig Dispense Refill  . chlorthalidone (HYGROTON) 25 MG tablet Take 25 mg by mouth daily.     . Cholecalciferol (VITAMIN D PO) Take 5,000 Int'l Units/day by mouth.    . Cyanocobalamin 2500 MCG TABS Take 2,500 mcg by mouth daily.     . diclofenac sodium (VOLTAREN) 1 % GEL Apply 4 g topically 4 (four) times daily. 100 g 11  . estradiol (ESTRACE) 0.5 MG tablet 1/2 tab daily (Patient taking differently: Take 0.25 mg by mouth daily. 1/2 tab daily) 45 tablet 4  . gabapentin (NEURONTIN) 400 MG capsule Take 2 capsules (800 mg total) by mouth at bedtime. Please call [phone] to schedule follow up  or may request from PCP. 60 capsule 6  . lidocaine-prilocaine (EMLA) cream Apply 1 application topically as needed. 30 g 3  . medroxyPROGESTERone (PROVERA) 2.5 MG tablet 1 tab  daily, days 1-15 each month 45 tablet 4  . mirabegron ER (MYRBETRIQ) 50 MG TB24 tablet Take 50 mg by mouth daily.    Marland Kitchen olmesartan (BENICAR) 20 MG tablet Take 20 mg by mouth daily.     Marland Kitchen rOPINIRole (REQUIP) 0.5 MG tablet Take 2 tablets (1 mg total) by mouth at bedtime. 60 tablet 11  . rosuvastatin (CRESTOR) 5 MG tablet Take 5 mg by mouth daily.     No facility-administered medications prior to visit.    PAST MEDICAL HISTORY: Past Medical History:  Diagnosis Date  . Cholelithiasis with chronic cholecystitis without biliary obstruction 12/12/2017  . Gallstones   . History of decreased platelet count   . Hyperlipidemia   . Hypertension   . Incarcerated umbilical hernia 6/57/8469  . Umbilical hernia     PAST SURGICAL HISTORY: Past Surgical History:  Procedure Laterality Date  . CHOLECYSTECTOMY N/A 12/12/2017   Procedure: LAPAROSCOPIC CHOLECYSTECTOMY WITH INTRAOPERATIVE CHOLANGIOGRAM ERAS PATHWAY;  Surgeon: Fanny Skates, MD;  Location: [facility];  Service: General;  Laterality: N/A;  . EYE SURGERY  1964   lazy eye  . no colonoscopy     03/26/13  & 04/28/14 SOC reviewed  . SHOULDER SURGERY  2012   Left ; Dr Onnie Graham  . UMBILICAL HERNIA REPAIR N/A 12/12/2017   Procedure: REPAIR UMBILICAL HERNIA;  Surgeon: Fanny Skates, MD;  Location: [facility];  Service: General;  Laterality: N/A;    FAMILY HISTORY: Family History  Problem Relation Age of Onset  . Diabetes Mother   . Hyperlipidemia Mother   . Hypertension Mother   . Thyroid nodules Mother   . Testicular cancer Brother 53  . Diabetes Brother   . Prostate cancer Father 59  . Stroke Neg Hx   . Heart disease Neg Hx   . Colon cancer Neg Hx   . Stomach cancer Neg Hx     SOCIAL HISTORY: Social History   Socioeconomic History  . Marital status: Married    Spouse name: Erica Daugherty  . Number of children: 4  . Years of education: college  . Highest education level: Not on file  Occupational History  . Occupation: Engineer, site: SELF EMPLOYED  Tobacco Use  . Smoking status: Never Smoker  . Smokeless tobacco: Never Used  Substance and Sexual Activity  . Alcohol use: Yes    Comment: occ  . Drug use: No  . Sexual activity: Yes    Partners: Male    Birth control/protection: Post-menopausal, Surgical    Comment: vasectomy  Other Topics Concern  . Not on file  Social History Narrative   Patient lives at home with her husband Erica Daugherty). Patient works full time.   Education college   Right handed.   Caffeine sometimes one cup of sweet tea.   Social Determinants of Health   Financial Resource Strain:   . Difficulty of Paying Living Expenses:   Food Insecurity:   . Worried About Charity fundraiser in the Last Year:   . Arboriculturist in the Last Year:   Transportation Needs:   . Film/video editor (Medical):   Marland Kitchen Lack of Transportation (Non-Medical):   Physical Activity:   . Days of Exercise per Week:   . Minutes of Exercise per Session:   Stress:   .  Feeling of Stress :   Social Connections:   . Frequency of Communication with Friends and Family:   . Frequency of Social Gatherings with Friends and Family:   . Attends Religious Services:   . Active Member of Clubs or Organizations:   . Attends Archivist Meetings:   Marland Kitchen Marital Status:   Intimate Partner Violence:   . Fear of Current or Ex-Partner:   . Emotionally Abused:   Marland Kitchen Physically Abused:   . Sexually Abused:      PHYSICAL EXAM  There were no vitals filed for this visit. There is no height or weight on file to calculate BMI.  PHYSICAL EXAMNIATION:  PHYSICAL EXAMNIATION:  Gen: NAD, conversant, well nourised, well groomed                     Cardiovascular: Regular rate rhythm, no peripheral edema, warm, nontender. Eyes: Conjunctivae clear without exudates or hemorrhage Neck: Supple, no carotid bruits. Pulmonary: Clear to auscultation bilaterally   NEUROLOGICAL EXAM:  MENTAL  STATUS: Speech/Cognition: Awake, alert, normal speech, oriented to history taking and casual conversation.  CRANIAL NERVES: CN II: Visual fields are full to confrontation.  Pupils are round equal and briskly reactive to light. CN III, IV, VI: extraocular movement are normal. No ptosis. CN V: Facial sensation is intact to light touch. CN VII: Face is symmetric with normal eye closure and smile. CN VIII: Hearing is normal to casual conversation CN IX, X: Palate elevates symmetrically. Phonation is normal. CN XI: Head turning and shoulder shrug are intact CN XII: Tongue is midline with normal movements and no atrophy.  MOTOR: She has mild bilateral toe flexion, extension weakness,  REFLEXES: Areflexia  SENSORY: Decreased vibratory sensation to mid shin level, absent toe proprioception, decreased pinprick to mid shin, decreased bilateral fingertips vibratory sensation  COORDINATION: There is no trunk or limb ataxia.    GAIT/STANCE: She can get up from seated position arms crossed, mildly antalgic gait, difficulty standing up on heels, tiptoe, positive Romberg signs.     DIAGNOSTIC DATA (LABS, IMAGING, TESTING) - I reviewed patient records, labs, notes, testing and imaging myself where available.  Lab Results  Component Value Date   WBC 7.3 08/05/2019   HGB 13.0 08/05/2019   HCT 38.5 08/05/2019   MCV 89 08/05/2019   PLT 237 08/05/2019      Component Value Date/Time   NA 141 08/05/2019 1230   K 3.5 08/05/2019 1230   CL 99 08/05/2019 1230   CO2 29 08/05/2019 1230   GLUCOSE 101 (H) 08/05/2019 1230   GLUCOSE 81 12/03/2017 0924   BUN 18 08/05/2019 1230   CREATININE 1.07 (H) 08/05/2019 1230   CREATININE 1.02 10/09/2015 1527   CALCIUM 9.8 08/05/2019 1230   PROT 6.7 08/05/2019 1230   ALBUMIN 3.9 08/05/2019 1230   AST 18 08/05/2019 1230   ALT 17 08/05/2019 1230   ALKPHOS 57 08/05/2019 1230   BILITOT 0.3 08/05/2019 1230   GFRNONAA 57 (L) 08/05/2019 1230   GFRAA 66  08/05/2019 1230   Lab Results  Component Value Date   CHOL 162 01/23/2016   HDL 32 (A) 01/23/2016   LDLCALC 119 01/23/2016   LDLDIRECT 157.2 03/26/2013   TRIG 53 01/23/2016   CHOLHDL 3.8 10/09/2015   Lab Results  Component Value Date   HGBA1C 5.7 (H) 08/05/2019    Lab Results  Component Value Date   TSH 1.840 08/05/2019      ASSESSMENT AND PLAN  58  y.o. year old female  Peripheral neuropathy   That was confirmed by previous EMG nerve conduction study in June 2015, and repeat study today, which showed evidence of mixed axonal and demyelinating neuropathy, with prolonged bilateral tibial, left ulnar F-wave latency, temporal dispersion of bilateral tibial motor responses in the setting of low CMAP amplitude.  On examination, she is areflexia, decreased vibratory sensation to mid shin level, absent toe proprioception, decreased fingertip proprioception, positive Romberg signs, mild weakness of bilateral toe flexion, extension weakness,  She has gradual onset, slow progressive symptoms since 2015,  After discussed with patient, will proceed with lumbar puncture, potential IVIG treatment,  Her symptoms are under better control now with current combination of gabapentin 400 mg 2 tablets every night, Requip 0.5 mg 2 tablets every night,  But still complains of significant intermittent daytime bilateral feet, lower extremity paresthesia, muscle cramping, especially after weightbearing, will add on Cymbalta 60 mg daily     Marcial Pacas, M.D. Ph.D.  [facility] [place], [place] [address] Phone: [phone] Fax:      [phone]

## 2019-09-01 ENCOUNTER — Telehealth: Payer: Self-pay | Admitting: Neurology

## 2019-09-01 NOTE — Telephone Encounter (Signed)
Pt has called to report that on 06-01 her insurance is changing from [facility] to [place].  Pt states her Lumbar Puncture is already scheduled for next week.  Pt is asking if she should wait and have that done under her new insurance or go ahead under current plan.  Pt also has a concern re: IVIG treatment that she states Dr Krista Blue is advocating for her to get.  Pt wants to know if that should be pursued under current plan or if she should wait until 06-01 under her BCBS coverage.  Please call pt to discuss.

## 2019-09-01 NOTE — Telephone Encounter (Addendum)
I contacted the pt. Pt wanted to know if she could proceed with getting her LP as scheduled for 09/08/2019 on her current insurance plan and then starting the PA process on her new [employer] policy for the IVIG if recommended on or after 09/21/2019. Her new policy will take effect 09/21/2019. Pt was advised this would not be a problem and she can proceed with the LP as scheduled.  I reviewed the importance of laying flat for 24 hours to decrease risk of post LP HA. Pt verbalized understanding.

## 2019-09-08 ENCOUNTER — Telehealth: Payer: Self-pay | Admitting: Neurology

## 2019-09-08 ENCOUNTER — Other Ambulatory Visit: Payer: Self-pay

## 2019-09-08 ENCOUNTER — Ambulatory Visit
Admission: RE | Admit: 2019-09-08 | Discharge: 2019-09-08 | Disposition: A | Discharge status: Home / Self Care | Payer: 59 | Source: Ambulatory Visit | Attending: Neurology | Admitting: Neurology

## 2019-09-08 VITALS — BP 133/79 | HR 69

## 2019-09-08 DIAGNOSIS — R269 Unspecified abnormalities of gait and mobility: Secondary | ICD-10-CM

## 2019-09-08 DIAGNOSIS — G6289 Other specified polyneuropathies: Secondary | ICD-10-CM

## 2019-09-08 DIAGNOSIS — M792 Neuralgia and neuritis, unspecified: Secondary | ICD-10-CM

## 2019-09-08 NOTE — Telephone Encounter (Signed)
The patient is agreeable to IVIG treatment. She does not wish to start until after 09/21/2019 because she has a new insurance plan going into effect on that date. Once she has her new card, she will send it to [place] through [employer].

## 2019-09-08 NOTE — Discharge Instructions (Signed)

## 2019-09-08 NOTE — Telephone Encounter (Signed)
Left message requesting a call back.

## 2019-09-08 NOTE — Telephone Encounter (Signed)
Signed orders have been provided to [facility].

## 2019-09-08 NOTE — Telephone Encounter (Signed)
Please call patient, spinal fluid testing showed no significant abnormalities  CSF total protein was 38   Patient had progressive bilateral lower extremity paresthesia, mild toe weakness, areflexia, EMG nerve conduction study also showed mixed demyelinating and axonal neuropathy,  Will start IVIG treatment, 2 g/kg (loading dose divided into 4 days= 500 mg/kgx4) followed by maintenance dose every 4 weeks, 1 g/kg (= 500 mg/kg for 2 days)

## 2019-09-22 ENCOUNTER — Encounter: Payer: Self-pay | Admitting: *Deleted

## 2019-10-06 ENCOUNTER — Telehealth: Payer: Self-pay | Admitting: Neurology

## 2019-10-06 NOTE — Telephone Encounter (Signed)
The appeal letter has been provided to [facility]. They will continue working on getting this treatment approved for the patient.

## 2019-10-06 NOTE — Telephone Encounter (Signed)
Appeal letter for IVIG is done on October 06 2019

## 2019-10-07 LAB — GRAM STAIN
GRAM STAIN:: NONE SEEN
MICRO NUMBER:: 10495819
SPECIMEN QUALITY:: ADEQUATE

## 2019-10-07 LAB — FUNGUS CULTURE W SMEAR
CULTURE:: NO GROWTH
MICRO NUMBER:: 10495820
SMEAR:: NONE SEEN
SPECIMEN QUALITY:: ADEQUATE

## 2019-10-07 LAB — CSF CELL COUNT WITH DIFFERENTIAL
RBC Count, CSF: 9 cells/uL — ABNORMAL HIGH
WBC, CSF: 0 cells/uL (ref 0–5)

## 2019-10-07 LAB — PROTEIN, CSF: Total Protein, CSF: 38 mg/dL (ref 15–45)

## 2019-10-07 LAB — VDRL, CSF: VDRL Quant, CSF: NONREACTIVE

## 2019-10-07 LAB — GLUCOSE, CSF: Glucose, CSF: 58 mg/dL (ref 40–80)

## 2019-12-06 ENCOUNTER — Ambulatory Visit: Payer: 59 | Admitting: Neurology

## 2019-12-13 DIAGNOSIS — Z1231 Encounter for screening mammogram for malignant neoplasm of breast: Secondary | ICD-10-CM | POA: Diagnosis not present

## 2020-01-03 DIAGNOSIS — G6289 Other specified polyneuropathies: Secondary | ICD-10-CM | POA: Diagnosis not present

## 2020-01-04 DIAGNOSIS — G6289 Other specified polyneuropathies: Secondary | ICD-10-CM | POA: Diagnosis not present

## 2020-01-05 DIAGNOSIS — G6289 Other specified polyneuropathies: Secondary | ICD-10-CM | POA: Diagnosis not present

## 2020-01-06 DIAGNOSIS — G6289 Other specified polyneuropathies: Secondary | ICD-10-CM | POA: Diagnosis not present

## 2020-01-17 ENCOUNTER — Encounter: Payer: Self-pay | Admitting: Gastroenterology

## 2020-01-18 ENCOUNTER — Telehealth: Payer: Self-pay | Admitting: Neurology

## 2020-01-18 ENCOUNTER — Other Ambulatory Visit: Payer: Self-pay

## 2020-01-18 ENCOUNTER — Ambulatory Visit (INDEPENDENT_AMBULATORY_CARE_PROVIDER_SITE_OTHER): Payer: BC Managed Care – PPO | Admitting: Neurology

## 2020-01-18 ENCOUNTER — Encounter: Payer: Self-pay | Admitting: Neurology

## 2020-01-18 VITALS — BP 124/85 | HR 63 | Ht 66.5 in | Wt 191.5 lb

## 2020-01-18 DIAGNOSIS — G2581 Restless legs syndrome: Secondary | ICD-10-CM

## 2020-01-18 DIAGNOSIS — G6289 Other specified polyneuropathies: Secondary | ICD-10-CM

## 2020-01-18 DIAGNOSIS — M792 Neuralgia and neuritis, unspecified: Secondary | ICD-10-CM

## 2020-01-18 MED ORDER — DULOXETINE HCL 30 MG PO CPEP
30.0000 mg | ORAL_CAPSULE | Freq: Every day | ORAL | 11 refills | Status: DC
Start: 2020-01-18 — End: 2020-08-21

## 2020-01-18 MED ORDER — GABAPENTIN 300 MG PO CAPS: 1200.0000 mg | ORAL_CAPSULE | Freq: Every day | ORAL | 4 refills | Status: DC

## 2020-01-18 NOTE — Telephone Encounter (Signed)
error 

## 2020-01-18 NOTE — Progress Notes (Signed)
[facility]  PATIENT: Erica Daugherty DOB: [birth year]  HISTORY OF PRESENT ILLNESS: Erica Daugherty is a 59 years old right-handed Caucasian female, referred by her primary care physician Dr. Unice Daugherty, and pain management Dr. 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 She complains of 6 months history of bilateral feet paresthesia, at plantar surface, pad, numbness, burning sensation, needle prick, She denies weakness, getting sore after weight bearing. She does have low back pain, going down her legs. She denies gait difficulty, no fingers paresthesia, no incontinence, Laboratory in December 2014, which has demonstrates normal CMP, CBC, TSH, LDL was elevated 152  UPDATE July 9th 2015:She is taking Gabapentin 170m bid, which does help her symptoms, Labs showed normal BJ68 folic acid, RPR, mild elevated A1c 5.7, normal electrophoresis.   UPDATE Mar 10 2018: She was seen by NP Erica Daugherty past few years, she continues to complains of plantar feet swollen, was seen by vein specialist, She denies low back pain, painful to walk, she is now see urologist for urinary incontinence,   She was taking gabapentin 400 mg every night, which has been helpful, symptomatic during the day, gabapentin makes her very sleepy,  UPDATE August 05 2019: Over the years, AShanequecontinue complains of bilateral feet burning pain, especially late afternoon and nighttime, burning painful, difficulty sleeping, she often has to rubbing her feet together or using her hands to wrapping her lower extremity to ease up the discomfort.  She has been taking gabapentin 400 mg +100 mg 3 tablets every night with partial relief of her symptoms, she could not tolerate daytime dosage due to significant sleepiness  She denies  upper extremity involvement, denies gait abnormality, has urinary urgency, but denies significant neck, low back pain.  UPDATE Aug 30 2019: Patient return for electrodiagnostic study today, which confirmed the diagnosis of chronic neuropathy, there is evidence of mixed demyelinating and axonal features, as evident by prolonged F-wave latency at bilateral tibial, and the left ulnar motor response, there is also evidence of temporal dispersion at bilateral tibial motor responses, in the setting of decreased CMAP amplitude.  On examination, she was areflexia, decreased vibratory sensation to mid shin level, absent toe proprioception, positive Romberg signs, mild bilateral toe flexion, extension weakness  Extensive laboratory evaluations showed no treatable etiology, mild elevated A1c 5.7, normal B12, copper, vitamin D, ESR, CPK, TSH, folic acid, C-reactive protein, RPR, HIV, CBC, CMP, ANA, ferritin, protein electrophoresis,  UPDATE Sept 28 2021: She lived her first dose of IVIG on Sept 13 2021, feel bad for one day, Coreg 12 rest of the days, schedule on Feb 01 2020 for second infusion.  She was added on Cymbalta 663mqhs, she has trouble tolerating the medication, make her feel like hang over, even if she take it at nighttime, it does help her bilateral lower extremity deep achy pain, in addition, she takes gabapentin 400 mg 2 tablets every night for restless leg syndrome, sleeps well most of the time, but occasionally has recurrent symptoms urge to move her leg restless at the evening time  Lumbar puncture on Sep 08, 2019, total protein of 38, WBC of 0  REVIEW OF SYSTEMS: Full 14 system review of systems performed and notable only for those listed, all others are neg:   ALLERGIES: No Known Allergies  HOME MEDICATIONS: Outpatient  Medications Prior to Visit  Medication Sig Dispense Refill   chlorthalidone (HYGROTON) 25 MG tablet Take 25 mg by mouth daily.      Cholecalciferol (VITAMIN D PO)  Take 5,000 Int'l Units/day by mouth.     Cyanocobalamin 2500 MCG TABS Take 2,500 mcg by mouth daily.      DULoxetine (CYMBALTA) 60 MG capsule Take 1 capsule (60 mg total) by mouth daily. 30 capsule 12   estradiol (ESTRACE) 0.5 MG tablet 1/2 tab daily (Patient taking differently: Take 0.25 mg by mouth daily. 1/2 tab daily) 45 tablet 4   gabapentin (NEURONTIN) 400 MG capsule Take 2 capsules (800 mg total) by mouth at bedtime. Please call [phone] to schedule follow up or may request from PCP. 60 capsule 6   medroxyPROGESTERone (PROVERA) 2.5 MG tablet 1 tab daily, days 1-15 each month 45 tablet 4   olmesartan (BENICAR) 20 MG tablet Take 20 mg by mouth daily.      rOPINIRole (REQUIP) 0.5 MG tablet Take 2 tablets (1 mg total) by mouth at bedtime. 60 tablet 11   rosuvastatin (CRESTOR) 5 MG tablet Take 5 mg by mouth daily.     diclofenac sodium (VOLTAREN) 1 % GEL Apply 4 g topically 4 (four) times daily. 100 g 11   lidocaine-prilocaine (EMLA) cream Apply 1 application topically as needed. 30 g 3   mirabegron ER (MYRBETRIQ) 50 MG TB24 tablet Take 50 mg by mouth daily.     tolterodine (DETROL LA) 4 MG 24 hr capsule Take 4 mg by mouth daily.     No facility-administered medications prior to visit.    PAST MEDICAL HISTORY: Past Medical History:  Diagnosis Date   Cholelithiasis with chronic cholecystitis without biliary obstruction 12/12/2017   Gallstones    History of decreased platelet count    Hyperlipidemia    Hypertension    Incarcerated umbilical hernia 7/34/2876   Umbilical hernia     PAST SURGICAL HISTORY: Past Surgical History:  Procedure Laterality Date   CHOLECYSTECTOMY N/A 12/12/2017   Procedure: LAPAROSCOPIC CHOLECYSTECTOMY WITH INTRAOPERATIVE CHOLANGIOGRAM ERAS PATHWAY;  Surgeon: Erica Skates, MD;  Location: [facility] OR;  Service: General;  Laterality: N/A;   [place]   lazy eye   no colonoscopy     03/26/13  & 04/28/14 [place] reviewed   SHOULDER SURGERY   2012   Left ; Dr Erica Daugherty   UMBILICAL HERNIA REPAIR N/A 12/12/2017   Procedure: REPAIR UMBILICAL HERNIA;  Surgeon: Erica Skates, MD;  Location: [facility];  Service: General;  Laterality: N/A;    FAMILY HISTORY: Family History  Problem Relation Age of Onset   Diabetes Mother    Hyperlipidemia Mother    Hypertension Mother    Thyroid nodules Mother    Testicular cancer Brother 33   Diabetes Brother    Prostate cancer Father 40   Stroke Neg Hx    Heart disease Neg Hx    Colon cancer Neg Hx    Stomach cancer Neg Hx     SOCIAL HISTORY: Social History   Socioeconomic History   Marital status: Married    Spouse name: [name]   Number of children: 4   Years of education: college   Highest education level: Not on file  Occupational History   Occupation: Pharmacist, hospital: SELF EMPLOYED  Tobacco Use   Smoking status: Never Smoker   Smokeless tobacco: Never Used  Vaping Use   Vaping Use: Never used  Substance and Sexual Activity  Alcohol use: Yes    Comment: occ   Drug use: No   Sexual activity: Yes    Partners: Male    Birth control/protection: Post-menopausal, Surgical    Comment: vasectomy  Other Topics Concern   Not on file  Social History Narrative   Patient lives at home with her husband [name]). Patient works full time.   Education college   Right handed.   Caffeine sometimes one cup of sweet tea.   Social Determinants of Health   Financial Resource Strain:    Difficulty of Paying Living Expenses: Not on file  Food Insecurity:    Worried About Charity fundraiser in the Last Year: Not on file   [employer] of Food in the Last Year: Not on file  Transportation Needs:    Lack of Transportation (Medical): Not on file   Lack of Transportation (Non-Medical): Not on file  Physical Activity:    Days of Exercise per Week: Not on file   Minutes of Exercise per Session: Not on file  Stress:    Feeling of Stress : Not on  file  Social Connections:    Frequency of Communication with Friends and Family: Not on file   Frequency of Social Gatherings with Friends and Family: Not on file   Attends Religious Services: Not on file   Active Member of Clubs or Organizations: Not on file   Attends Archivist Meetings: Not on file   Marital Status: Not on file  Intimate Partner Violence:    Fear of Current or Ex-Partner: Not on file   Emotionally Abused: Not on file   Physically Abused: Not on file   Sexually Abused: Not on file     PHYSICAL EXAM  Vitals:   01/18/20 1015  BP: 124/85  Pulse: 63  Weight: 191 lb 8 oz (86.9 kg)  Height: 5' 6.5" (1.689 m)   Body mass index is 30.45 kg/m.  PHYSICAL EXAMNIATION:  PHYSICAL EXAMNIATION:  Gen: NAD, conversant, well nourised, well groomed NEUROLOGICAL EXAM:  MENTAL STATUS: Speech/Cognition: Awake, alert, normal speech, oriented to history taking and casual conversation.  CRANIAL NERVES: CN II: Visual fields are full to confrontation.  Pupils are round equal and briskly reactive to light. CN III, IV, VI: extraocular movement are normal. No ptosis. CN V: Facial sensation is intact to light touch. CN VII: Face is symmetric with normal eye closure and smile. CN VIII: Hearing is normal to casual conversation CN IX, X: Palate elevates symmetrically. Phonation is normal. CN XI: Head turning and shoulder shrug are intact CN XII: Tongue is midline with normal movements and no atrophy.  MOTOR: She has mild bilateral toe flexion, extension weakness,  REFLEXES: Areflexia  SENSORY: Decreased vibratory sensation to mid shin level, absent toe proprioception, decreased pinprick to mid shin, decreased bilateral fingertips vibratory sensation  COORDINATION: There is no trunk or limb ataxia.    GAIT/STANCE: She can get up from seated position arms crossed, mildly antalgic gait, difficulty standing up on heels, tiptoe, positive Romberg  signs.     DIAGNOSTIC DATA (LABS, IMAGING, TESTING) - I reviewed patient records, labs, notes, testing and imaging myself where available.  Lab Results  Component Value Date   WBC 7.3 08/05/2019   HGB 13.0 08/05/2019   HCT 38.5 08/05/2019   MCV 89 08/05/2019   PLT 237 08/05/2019      Component Value Date/Time   NA 141 08/05/2019 1230   K 3.5 08/05/2019 1230   CL 99  08/05/2019 1230   CO2 29 08/05/2019 1230   GLUCOSE 101 (H) 08/05/2019 1230   GLUCOSE 81 12/03/2017 0924   BUN 18 08/05/2019 1230   CREATININE 1.07 (H) 08/05/2019 1230   CREATININE 1.02 10/09/2015 1527   CALCIUM 9.8 08/05/2019 1230   PROT 6.7 08/05/2019 1230   ALBUMIN 3.9 08/05/2019 1230   AST 18 08/05/2019 1230   ALT 17 08/05/2019 1230   ALKPHOS 57 08/05/2019 1230   BILITOT 0.3 08/05/2019 1230   GFRNONAA 57 (L) 08/05/2019 1230   GFRAA 66 08/05/2019 1230   Lab Results  Component Value Date   CHOL 162 01/23/2016   HDL 32 (A) 01/23/2016   LDLCALC 119 01/23/2016   LDLDIRECT 157.2 03/26/2013   TRIG 53 01/23/2016   CHOLHDL 3.8 10/09/2015   Lab Results  Component Value Date   HGBA1C 5.7 (H) 08/05/2019    Lab Results  Component Value Date   TSH 1.840 08/05/2019      ASSESSMENT AND PLAN  59 y.o. year old female  Peripheral neuropathy   That was confirmed by previous EMG nerve conduction study in June 2015, and repeat study today, which showed evidence of mixed axonal and demyelinating neuropathy, with prolonged bilateral tibial, left ulnar F-wave latency, temporal dispersion of bilateral tibial motor responses in the setting of low CMAP amplitude.  On examination, she is areflexia, decreased vibratory sensation to mid shin level, absent toe proprioception, decreased fingertip proprioception, positive Romberg signs, mild weakness of bilateral toe flexion, extension weakness,  She has gradual onset, slow progressive symptoms since 2015,  CSF May 2021, total protein of 38,   IVIG treatment, loading  dose January 03, 2020, tolerating it well overall, second dose pending October 2021, will complete 6 treatment total, if she showed improvement clinical/symptoms wise, we will continue on, if not, will stop.  Her symptoms are under better control now with current combination of gabapentin, will change gabapentin preparation to 300 mg 3 tablets every night, 1 extra tablet at evening if needed,  Cymbalta 60 mg has helped bilateral lower extremity paresthesia, cramping, but complains of sided effect, will decrease to 30 mg daily,  Return to clinic in 6 months with Thea Alken, M.D. Ph.D.  [facility] [place], [place] [address] Phone: [phone] Fax:      [phone]

## 2020-01-25 DIAGNOSIS — H25813 Combined forms of age-related cataract, bilateral: Secondary | ICD-10-CM | POA: Diagnosis not present

## 2020-01-25 DIAGNOSIS — H5213 Myopia, bilateral: Secondary | ICD-10-CM | POA: Diagnosis not present

## 2020-02-02 DIAGNOSIS — G6289 Other specified polyneuropathies: Secondary | ICD-10-CM | POA: Diagnosis not present

## 2020-02-03 DIAGNOSIS — G6289 Other specified polyneuropathies: Secondary | ICD-10-CM | POA: Diagnosis not present

## 2020-02-29 DIAGNOSIS — G6289 Other specified polyneuropathies: Secondary | ICD-10-CM | POA: Diagnosis not present

## 2020-03-01 DIAGNOSIS — G6289 Other specified polyneuropathies: Secondary | ICD-10-CM | POA: Diagnosis not present

## 2020-03-15 ENCOUNTER — Other Ambulatory Visit: Payer: Self-pay | Admitting: Neurology

## 2020-03-28 DIAGNOSIS — G6289 Other specified polyneuropathies: Secondary | ICD-10-CM | POA: Diagnosis not present

## 2020-03-29 DIAGNOSIS — G6289 Other specified polyneuropathies: Secondary | ICD-10-CM | POA: Diagnosis not present

## 2020-04-10 DIAGNOSIS — Z0000 Encounter for general adult medical examination without abnormal findings: Secondary | ICD-10-CM | POA: Diagnosis not present

## 2020-04-10 DIAGNOSIS — E782 Mixed hyperlipidemia: Secondary | ICD-10-CM | POA: Diagnosis not present

## 2020-04-12 DIAGNOSIS — Z23 Encounter for immunization: Secondary | ICD-10-CM | POA: Diagnosis not present

## 2020-04-12 DIAGNOSIS — Z1211 Encounter for screening for malignant neoplasm of colon: Secondary | ICD-10-CM | POA: Diagnosis not present

## 2020-04-12 DIAGNOSIS — Z0000 Encounter for general adult medical examination without abnormal findings: Secondary | ICD-10-CM | POA: Diagnosis not present

## 2020-04-17 DIAGNOSIS — G6289 Other specified polyneuropathies: Secondary | ICD-10-CM | POA: Diagnosis not present

## 2020-04-18 DIAGNOSIS — G6289 Other specified polyneuropathies: Secondary | ICD-10-CM | POA: Diagnosis not present

## 2020-05-02 DIAGNOSIS — Z23 Encounter for immunization: Secondary | ICD-10-CM | POA: Diagnosis not present

## 2020-05-18 DIAGNOSIS — G6289 Other specified polyneuropathies: Secondary | ICD-10-CM | POA: Diagnosis not present

## 2020-06-20 DIAGNOSIS — I10 Essential (primary) hypertension: Secondary | ICD-10-CM | POA: Diagnosis not present

## 2020-06-20 DIAGNOSIS — G4700 Insomnia, unspecified: Secondary | ICD-10-CM | POA: Diagnosis not present

## 2020-06-20 DIAGNOSIS — F331 Major depressive disorder, recurrent, moderate: Secondary | ICD-10-CM | POA: Diagnosis not present

## 2020-06-20 DIAGNOSIS — F411 Generalized anxiety disorder: Secondary | ICD-10-CM | POA: Diagnosis not present

## 2020-06-20 DIAGNOSIS — G6289 Other specified polyneuropathies: Secondary | ICD-10-CM | POA: Diagnosis not present

## 2020-06-21 DIAGNOSIS — G6289 Other specified polyneuropathies: Secondary | ICD-10-CM | POA: Diagnosis not present

## 2020-07-06 DIAGNOSIS — D2262 Melanocytic nevi of left upper limb, including shoulder: Secondary | ICD-10-CM | POA: Diagnosis not present

## 2020-07-06 DIAGNOSIS — L820 Inflamed seborrheic keratosis: Secondary | ICD-10-CM | POA: Diagnosis not present

## 2020-07-06 DIAGNOSIS — D2261 Melanocytic nevi of right upper limb, including shoulder: Secondary | ICD-10-CM | POA: Diagnosis not present

## 2020-07-06 DIAGNOSIS — D2372 Other benign neoplasm of skin of left lower limb, including hip: Secondary | ICD-10-CM | POA: Diagnosis not present

## 2020-07-11 ENCOUNTER — Other Ambulatory Visit: Payer: Self-pay | Admitting: Neurology

## 2020-07-11 DIAGNOSIS — E782 Mixed hyperlipidemia: Secondary | ICD-10-CM | POA: Diagnosis not present

## 2020-07-11 DIAGNOSIS — G629 Polyneuropathy, unspecified: Secondary | ICD-10-CM | POA: Diagnosis not present

## 2020-07-11 DIAGNOSIS — R600 Localized edema: Secondary | ICD-10-CM | POA: Diagnosis not present

## 2020-07-11 DIAGNOSIS — I10 Essential (primary) hypertension: Secondary | ICD-10-CM | POA: Diagnosis not present

## 2020-07-17 ENCOUNTER — Other Ambulatory Visit: Payer: Self-pay | Admitting: Neurology

## 2020-07-18 ENCOUNTER — Encounter: Payer: Self-pay | Admitting: Neurology

## 2020-07-18 ENCOUNTER — Ambulatory Visit (INDEPENDENT_AMBULATORY_CARE_PROVIDER_SITE_OTHER): Payer: BC Managed Care – PPO | Admitting: Neurology

## 2020-07-18 VITALS — BP 121/74 | HR 73 | Ht 66.5 in | Wt 194.0 lb

## 2020-07-18 DIAGNOSIS — G6289 Other specified polyneuropathies: Secondary | ICD-10-CM | POA: Diagnosis not present

## 2020-07-18 MED ORDER — ROPINIROLE HCL 0.5 MG PO TABS: 1.0000 mg | ORAL_TABLET | Freq: Every day | ORAL | 11 refills | Status: DC

## 2020-07-18 NOTE — Patient Instructions (Signed)
Check NCV/EMG study for comparison For now, continue IVIG Continue medications Schedule follow-up after the NCV/EMG

## 2020-07-18 NOTE — Progress Notes (Signed)
[facility]  PATIENT: Erica Daugherty DOB: [birth year]  HISTORY OF PRESENT ILLNESS: Erica Daugherty is a 60 years old right-handed Caucasian female, referred by her primary care physician Dr. Unice Cobble, and pain management Dr. 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 She complains of 6 months history of bilateral feet paresthesia, at plantar surface, pad, numbness, burning sensation, needle prick, She denies weakness, getting sore after weight bearing. She does have low back pain, going down her legs. She denies gait difficulty, no fingers paresthesia, no incontinence, Laboratory in December 2014, which has demonstrates normal CMP, CBC, TSH, LDL was elevated 152  UPDATE July 9th 2015:She is taking Gabapentin $RemoveBeforeDEI'100mg'[id number]$  bid, which does help her symptoms, Labs showed normal X32, folic acid, RPR, mild elevated A1c 5.7, normal electrophoresis.   UPDATE Mar 10 2018: She was seen by NP Hoyle Sauer over past few years, she continues to complains of plantar feet swollen, was seen by vein specialist, She denies low back pain, painful to walk, she is now see urologist for urinary incontinence,   She was taking gabapentin 400 mg every night, which has been helpful, symptomatic during the day, gabapentin makes her very sleepy,  UPDATE August 05 2019: Over the years, Virlee continue complains of bilateral feet burning pain, especially late afternoon and nighttime, burning painful, difficulty sleeping, she often has to rubbing her feet together or using her hands to wrapping her lower extremity to ease up the discomfort.  She has been taking gabapentin 400 mg +100 mg 3 tablets every night with partial relief of her symptoms, she could not tolerate daytime dosage due to significant sleepiness  She denies  upper extremity involvement, denies gait abnormality, has urinary urgency, but denies significant neck, low back pain.  UPDATE Aug 30 2019: Patient return for electrodiagnostic study today, which confirmed the diagnosis of chronic neuropathy, there is evidence of mixed demyelinating and axonal features, as evident by prolonged F-wave latency at bilateral tibial, and the left ulnar motor response, there is also evidence of temporal dispersion at bilateral tibial motor responses, in the setting of decreased CMAP amplitude.  On examination, she was areflexia, decreased vibratory sensation to mid shin level, absent toe proprioception, positive Romberg signs, mild bilateral toe flexion, extension weakness  Extensive laboratory evaluations showed no treatable etiology, mild elevated A1c 5.7, normal B12, copper, vitamin D, ESR, CPK, TSH, folic acid, C-reactive protein, RPR, HIV, CBC, CMP, ANA, ferritin, protein electrophoresis,  UPDATE Sept 28 2021: She lived her first dose of IVIG on Sept 13 2021, feel bad for one day, Coreg 12 rest of the days, schedule on Feb 01 2020 for second infusion.  She was added on Cymbalta $RemoveBef'60mg'[id number]$  qhs, she has trouble tolerating the medication, make her feel like hang over, even if she take it at nighttime, it does help her bilateral lower extremity deep achy pain, in addition, she takes gabapentin 400 mg 2 tablets every night for restless leg syndrome, sleeps well most of the time, but occasionally has recurrent symptoms urge to move her leg restless at the evening time  Lumbar puncture on Sep 08, 2019, total protein of 38, WBC of 0  Update July 18, 2020 SS: Here today alone, remains on IVIG, started in September, having them every 4 weeks, here at our office (7 so far tomorrow will be 8).  Symptoms of neuropathy, burning sensation bottom of feet, like fluid in bottoms of feet, bearing weight is painful, can't go barefoot. Bothersome most at night or late afternoon. In bed, laying  flat, pressure on heels is bothersome, feels best when dangling feet over edge. Things are below the ankles, feels unstable with walking, recently taken off the Olmesartan, decrease chlorthalidone. She doesn't fall. Taking gabapentin 900 mg at bedtime, on Cymbalta 30 mg daily (this is within last few weeks). Works as Engineer, structural, specializes in maternity and newborn. She thinks IVIG has helped, but difficult to quantify the improvement. Sleeping well with medications.   REVIEW OF SYSTEMS: Full 14 system review of systems performed and notable only for those listed, all others are neg:   See HPI   ALLERGIES: No Known Allergies  HOME MEDICATIONS: Outpatient Medications Prior to Visit  Medication Sig Dispense Refill  . chlorthalidone (HYGROTON) 25 MG tablet Take 12.5 mg by mouth daily.    . Cholecalciferol (VITAMIN D PO) Take 5,000 Int'l Units/day by mouth.    . Cyanocobalamin 2500 MCG TABS Take 2,500 mcg by mouth daily.     . DULoxetine (CYMBALTA) 30 MG capsule Take 1 capsule (30 mg total) by mouth daily. 30 capsule 11  . gabapentin (NEURONTIN) 300 MG capsule Take 4 capsules (1,200 mg total) by mouth at bedtime. Please call [phone] to schedule follow up or may request from PCP. 360 capsule 4  . rOPINIRole (REQUIP) 0.5 MG tablet Take 2 tablets (1 mg total) by mouth at bedtime. 60 tablet 11  . rosuvastatin (CRESTOR) 5 MG tablet Take 5 mg by mouth daily.    Marland Kitchen estradiol (ESTRACE) 0.5 MG tablet 1/2 tab daily (Patient taking differently: Take 0.25 mg by mouth daily. 1/2 tab daily) 45 tablet 4  . medroxyPROGESTERone (PROVERA) 2.5 MG tablet 1 tab daily, days 1-15 each month 45 tablet 4  . olmesartan (BENICAR) 20 MG tablet Take 20 mg by mouth daily.      No facility-administered medications prior to visit.    PAST MEDICAL HISTORY: Past Medical History:  Diagnosis Date  . Cholelithiasis with chronic cholecystitis without biliary obstruction 12/12/2017  . Gallstones   . History of  decreased platelet count   . Hyperlipidemia   . Hypertension   . Incarcerated umbilical hernia 12/19/5619  . Umbilical hernia     PAST SURGICAL HISTORY: Past Surgical History:  Procedure Laterality Date  . CHOLECYSTECTOMY N/A 12/12/2017   Procedure: LAPAROSCOPIC CHOLECYSTECTOMY WITH INTRAOPERATIVE CHOLANGIOGRAM ERAS PATHWAY;  Surgeon: Fanny Skates, MD;  Location: [place];  Service: General;  Laterality: N/A;  . EYE SURGERY  1964   lazy eye  . no colonoscopy     03/26/13  & 04/28/14 SOC reviewed  . SHOULDER SURGERY  2012   Left ; Dr Onnie Graham  . UMBILICAL HERNIA REPAIR N/A 12/12/2017   Procedure: REPAIR UMBILICAL HERNIA;  Surgeon: Fanny Skates, MD;  Location: [place];  Service: General;  Laterality: N/A;    FAMILY HISTORY: Family History  Problem Relation Age of Onset  . Diabetes Mother   . Hyperlipidemia Mother   . Hypertension Mother   . Thyroid nodules Mother   . Testicular cancer Brother 47  . Diabetes Brother   . Prostate cancer Father 67  . Stroke Neg Hx   . Heart disease Neg Hx   . Colon cancer Neg Hx   . Stomach cancer Neg Hx     SOCIAL HISTORY: Social History   Socioeconomic History  . Marital status:  Married    Spouse name: [name]  . Number of children: 4  . Years of education: college  . Highest education level: Not on file  Occupational History  . Occupation: Pharmacist, hospital: SELF EMPLOYED  Tobacco Use  . Smoking status: Never Smoker  . Smokeless tobacco: Never Used  Vaping Use  . Vaping Use: Never used  Substance and Sexual Activity  . Alcohol use: Yes    Comment: occ  . Drug use: No  . Sexual activity: Yes    Partners: Male    Birth control/protection: Post-menopausal, Surgical    Comment: vasectomy  Other Topics Concern  . Not on file  Social History Narrative   Patient lives at home with her husband [name]). Patient works full time.   Education college   Right handed.   Caffeine sometimes one cup of sweet tea.    Social Determinants of Health   Financial Resource Strain: Not on file  Food Insecurity: Not on file  Transportation Needs: Not on file  Physical Activity: Not on file  Stress: Not on file  Social Connections: Not on file  Intimate Partner Violence: Not on file   PHYSICAL EXAM  Vitals:   07/18/20 0937  BP: 121/74  Pulse: 73  Weight: 194 lb (88 kg)  Height: 5' 6.5" (1.689 m)   Body mass index is 30.84 kg/m.  PHYSICAL EXAMNIATION:  PHYSICAL EXAMNIATION:  Gen: NAD, conversant, well nourised, well groomed NEUROLOGICAL EXAM:  MENTAL STATUS: Speech/Cognition: Awake, alert, normal speech, oriented to history taking and casual conversation.  CRANIAL NERVES: CN II: Visual fields are full to confrontation.  Pupils are round equal and briskly reactive to light. CN III, IV, VI: extraocular movement are normal. No ptosis. CN V: Facial sensation is intact to light touch. CN VII: Face is symmetric with normal eye closure and smile. CN VIII: Hearing is normal to casual conversation CN XI: Head turning and shoulder shrug are intact  MOTOR: Mild bilateral toe flexion, extension weakness; otherwise, no significant muscle weakness noted in upper or lower extremities  REFLEXES: Areflexia  SENSORY: Decreased vibratory sensation to mid shin level,  decreased pinprick to ankle level (improvement from mid shin)  COORDINATION: Finger-nose-finger and heel-to-shin is normal  GAIT/STANCE: Able to stand from seated position without pushoff, mildly antalgic gait when barefoot, difficulty standing up on heels, tiptoe, tandem gait is fairly steady  DIAGNOSTIC DATA (LABS, IMAGING, TESTING) - I reviewed patient records, labs, notes, testing and imaging myself where available.  Lab Results  Component Value Date   WBC 7.3 08/05/2019   HGB 13.0 08/05/2019   HCT 38.5 08/05/2019   MCV 89 08/05/2019   PLT 237 08/05/2019      Component Value Date/Time   NA 141 08/05/2019 1230   K 3.5  08/05/2019 1230   CL 99 08/05/2019 1230   CO2 29 08/05/2019 1230   GLUCOSE 101 (H) 08/05/2019 1230   GLUCOSE 81 12/03/2017 0924   BUN 18 08/05/2019 1230   CREATININE 1.07 (H) 08/05/2019 1230   CREATININE 1.02 10/09/2015 1527   CALCIUM 9.8 08/05/2019 1230   PROT 6.7 08/05/2019 1230   ALBUMIN 3.9 08/05/2019 1230   AST 18 08/05/2019 1230   ALT 17 08/05/2019 1230   ALKPHOS 57 08/05/2019 1230   BILITOT 0.3 08/05/2019 1230   GFRNONAA 57 (L) 08/05/2019 1230   GFRAA 66 08/05/2019 1230   Lab Results  Component Value Date   CHOL 162 01/23/2016   HDL 32 (A)  01/23/2016   LDLCALC 119 01/23/2016   LDLDIRECT 157.2 03/26/2013   TRIG 53 01/23/2016   CHOLHDL 3.8 10/09/2015   Lab Results  Component Value Date   HGBA1C 5.7 (H) 08/05/2019    Lab Results  Component Value Date   TSH 1.840 08/05/2019   ASSESSMENT AND PLAN  60 y.o. year old female  1. Peripheral neuropathy   That was confirmed by previous EMG nerve conduction study in June 2015, and repeat study May 2021, which showed evidence of mixed axonal and demyelinating neuropathy, with prolonged bilateral tibial, left ulnar F-wave latency, temporal dispersion of bilateral tibial motor responses in the setting of low CMAP amplitude.  On examination (Aug 25 2019), she is areflexia, decreased vibratory sensation to mid shin level, absent toe proprioception, decreased fingertip proprioception, positive Romberg signs, mild weakness of bilateral toe flexion, extension weakness  She has gradual onset, slow progressive symptoms since 2015,  CSF May 2021, total protein of 38,   IVIG treatment, loading dose January 03, 2020, tolerating it well overall, second dose pending October 2021, will complete 6 treatment total, if she showed improvement clinical/symptoms wise, we will continue on, if not, will stop.  Today 07/18/2020, she continues to be areflexia, improvement in pinprick sensation normal at the ankle level was at shin, hard to to quantify  improvement, will check NCV/EMG 1 year from last for comparison, does think her gait stability has improved  Her symptoms are under better controlled at night on gabapentin 900 mg at bedtime, Cymbalta 30 mg AM  After NCV/EMG will arrange next follow-up appointment, tomorrow will be her 8th IVIG treatment   I spent 30 minutes of face-to-face and non-face-to-face time with patient.  This included previsit chart review, lab review, study review, order entry, electronic health record documentation, patient education.  Evangeline Dakin, DNP  [facility] [address] [place], [place] [address] [phone]

## 2020-07-19 DIAGNOSIS — G6289 Other specified polyneuropathies: Secondary | ICD-10-CM | POA: Diagnosis not present

## 2020-07-20 DIAGNOSIS — G6289 Other specified polyneuropathies: Secondary | ICD-10-CM | POA: Diagnosis not present

## 2020-08-15 DIAGNOSIS — G6289 Other specified polyneuropathies: Secondary | ICD-10-CM | POA: Diagnosis not present

## 2020-08-16 DIAGNOSIS — G6289 Other specified polyneuropathies: Secondary | ICD-10-CM | POA: Diagnosis not present

## 2020-08-21 ENCOUNTER — Ambulatory Visit (INDEPENDENT_AMBULATORY_CARE_PROVIDER_SITE_OTHER): Payer: BC Managed Care – PPO | Admitting: Neurology

## 2020-08-21 ENCOUNTER — Encounter (INDEPENDENT_AMBULATORY_CARE_PROVIDER_SITE_OTHER): Payer: BC Managed Care – PPO | Admitting: Neurology

## 2020-08-21 DIAGNOSIS — G629 Polyneuropathy, unspecified: Secondary | ICD-10-CM | POA: Diagnosis not present

## 2020-08-21 DIAGNOSIS — R269 Unspecified abnormalities of gait and mobility: Secondary | ICD-10-CM | POA: Diagnosis not present

## 2020-08-21 DIAGNOSIS — Z0289 Encounter for other administrative examinations: Secondary | ICD-10-CM

## 2020-08-21 DIAGNOSIS — G6289 Other specified polyneuropathies: Secondary | ICD-10-CM

## 2020-08-21 DIAGNOSIS — G2581 Restless legs syndrome: Secondary | ICD-10-CM

## 2020-08-21 MED ORDER — ROPINIROLE HCL 1 MG PO TABS: 2.0000 mg | ORAL_TABLET | Freq: Every day | ORAL | 11 refills | Status: DC

## 2020-08-21 MED ORDER — GABAPENTIN 300 MG PO CAPS: 1200.0000 mg | ORAL_CAPSULE | Freq: Every day | ORAL | 4 refills | Status: DC

## 2020-08-21 MED ORDER — DULOXETINE HCL 60 MG PO CPEP: 60.0000 mg | ORAL_CAPSULE | Freq: Every day | ORAL | 4 refills | Status: DC

## 2020-08-21 NOTE — Progress Notes (Signed)
ASSESSMENT AND PLAN  60 y.o. year old female  Peripheral neuropathy   That was confirmed by previous EMG nerve conduction study in June 2015, and repeat study May 2021, which showed evidence of mixed axonal and demyelinating neuropathy, with prolonged bilateral tibial, left ulnar F-wave latency, temporal dispersion of bilateral tibial motor responses in the setting of low CMAP amplitude.  On examination (Aug 25 2019), she is areflexia, decreased vibratory sensation to mid shin level, absent toe proprioception, decreased fingertip proprioception, positive Romberg signs, mild weakness of bilateral toe flexion, extension weakness  She has gradual onset, slow progressive symptoms since 2015,  CSF May 2021, total protein of 38,   IVIG treatment, loading dose January 03, 2020, for more than 6 treatment now, there was no significant clinical and electrodiagnostic improvement, she is also concerned about the high cost associated with IVIG treatment, we decided to stop IVIG  Add on Cymbalta 60 mg daily,  Restless leg symptoms  Continue gabapentin 300 mg 4 tablets at nighttime, and Requip 1 mg 2 tablets at bedtime     PHYSICAL EXAM  There were no vitals filed for this visit. There is no height or weight on file to calculate BMI.  PHYSICAL EXAMNIATION:  PHYSICAL EXAMNIATION:  Gen: NAD, conversant, well nourised, well groomed NEUROLOGICAL EXAM:  MENTAL STATUS: Speech/Cognition: Awake, alert, normal speech, oriented to history taking and casual conversation.  CRANIAL NERVES: CN II: Visual fields are full to confrontation.  Pupils are round equal and briskly reactive to light. CN III, IV, VI: extraocular movement are normal. No ptosis. CN V: Facial sensation is intact to light touch. CN VII: Face is symmetric with normal eye closure and smile. CN VIII: Hearing is normal to casual conversation CN XI: Head turning and shoulder shrug are intact  MOTOR: Mild bilateral toe flexion,  extension weakness; otherwise, no significant muscle weakness noted in upper or lower extremities  REFLEXES: Areflexia  SENSORY: Decreased vibratory sensation to mid shin level,  decreased pinprick to ankle level (improvement from mid shin)  COORDINATION: Finger-nose-finger and heel-to-shin is normal  GAIT/STANCE: Able to stand from seated position without pushoff, mildly antalgic gait when barefoot, difficulty standing up on heels, tiptoe, tandem gait is fairly steady  DIAGNOSTIC DATA (LABS, IMAGING, TESTING) - I reviewed patient records, labs, notes, testing and imaging myself where available.  Lab Results  Component Value Date   WBC 7.3 08/05/2019   HGB 13.0 08/05/2019   HCT 38.5 08/05/2019   MCV 89 08/05/2019   PLT 237 08/05/2019      Component Value Date/Time   NA 141 08/05/2019 1230   K 3.5 08/05/2019 1230   CL 99 08/05/2019 1230   CO2 29 08/05/2019 1230   GLUCOSE 101 (H) 08/05/2019 1230   GLUCOSE 81 12/03/2017 0924   BUN 18 08/05/2019 1230   CREATININE 1.07 (H) 08/05/2019 1230   CREATININE 1.02 10/09/2015 1527   CALCIUM 9.8 08/05/2019 1230   PROT 6.7 08/05/2019 1230   ALBUMIN 3.9 08/05/2019 1230   AST 18 08/05/2019 1230   ALT 17 08/05/2019 1230   ALKPHOS 57 08/05/2019 1230   BILITOT 0.3 08/05/2019 1230   GFRNONAA 57 (L) 08/05/2019 1230   GFRAA 66 08/05/2019 1230   Lab Results  Component Value Date   CHOL 162 01/23/2016   HDL 32 (A) 01/23/2016   LDLCALC 119 01/23/2016   LDLDIRECT 157.2 03/26/2013   TRIG 53 01/23/2016   CHOLHDL 3.8 10/09/2015   Lab Results  Component Value Date  HGBA1C 5.7 (H) 08/05/2019    Lab Results  Component Value Date   TSH 1.840 08/05/2019     HISTORY OF PRESENT ILLNESS: Erica Daugherty is a 60 years old right-handed Caucasian female, referred by her primary care physician Dr. Unice Cobble, and pain management Dr. Roxy Horseman for evaluation of numbness in feet,  Electrodiagnostic study on October 06 2013, demonstrated a mild  length dependent axonal sensory motor polyneuropathy She has past medical history of obesity but denies history of diabetes. She complains of 6 months history of bilateral feet paresthesia, at plantar surface, pad, numbness, burning sensation, needle prick, She denies weakness, getting sore after weight bearing. She does have low back pain, going down her legs. She denies gait difficulty, no fingers paresthesia, no incontinence, Laboratory in December 2014, which has demonstrates normal CMP, CBC, TSH, LDL was elevated 152  UPDATE July 9th 2015:She is taking Gabapentin 11m bid, which does help her symptoms, Labs showed normal BN82 folic acid, RPR, mild elevated A1c 5.7, normal electrophoresis.   UPDATE Mar 10 2018: She was seen by NP CHoyle Sauerover past few years, she continues to complains of plantar feet swollen, was seen by vein specialist, She denies low back pain, painful to walk, she is now see urologist for urinary incontinence,   She was taking gabapentin 400 mg every night, which has been helpful, symptomatic during the day, gabapentin makes her very sleepy,  UPDATE August 05 2019: Over the years, ARaiyahcontinue complains of bilateral feet burning pain, especially late afternoon and nighttime, burning painful, difficulty sleeping, she often has to rubbing her feet together or using her hands to wrapping her lower extremity to ease up the discomfort.  She has been taking gabapentin 400 mg +100 mg 3 tablets every night with partial relief of her symptoms, she could not tolerate daytime dosage due to significant sleepiness  She denies upper extremity involvement, denies gait abnormality, has urinary urgency, but denies significant neck, low back pain.  UPDATE Aug 30 2019: Patient return for electrodiagnostic study today, which confirmed the diagnosis of chronic neuropathy, there is evidence of mixed demyelinating and axonal features, as evident by prolonged F-wave latency at bilateral  tibial, and the left ulnar motor response, there is also evidence of temporal dispersion at bilateral tibial motor responses, in the setting of decreased CMAP amplitude.  On examination, she was areflexia, decreased vibratory sensation to mid shin level, absent toe proprioception, positive Romberg signs, mild bilateral toe flexion, extension weakness  Extensive laboratory evaluations showed no treatable etiology, mild elevated A1c 5.7, normal B12, copper, vitamin D, ESR, CPK, TSH, folic acid, C-reactive protein, RPR, HIV, CBC, CMP, ANA, ferritin, protein electrophoresis,  UPDATE Sept 28 2021: She lived her first dose of IVIG on Sept 13 2021, feel bad for one day, Coreg 12 rest of the days, schedule on Feb 01 2020 for second infusion.  She was added on Cymbalta 645mqhs, she has trouble tolerating the medication, make her feel like hang over, even if she take it at nighttime, it does help her bilateral lower extremity deep achy pain, in addition, she takes gabapentin 400 mg 2 tablets every night for restless leg syndrome, sleeps well most of the time, but occasionally has recurrent symptoms urge to move her leg restless at the evening time  Lumbar puncture on Sep 08, 2019, total protein of 38, WBC of 0  Update Aug 21, 2020: She had received a loading dose of IVIG in September 2021, every 3 weeks, had  more than 6 treatment, denies significant improvement clinically, also complains of high cost  Repeat electrodiagnostic study today showed no significant difference compared to previous study in May 2021, no continued evidence of peripheral neuropathy, axonal, likely a component of demyelinating,  She also complains of restless leg symptoms, urge to move her leg some of the nights, Requip has been helpful, taking 1 mg 2 tablets every night, higher dose of gabapentin up to 300 mg 4 tablets every night has helped her sleep better  REVIEW OF SYSTEMS: Full 14 system review of systems performed and notable  only for those listed, all others are neg:   See HPI   ALLERGIES: No Known Allergies  HOME MEDICATIONS: Outpatient Medications Prior to Visit  Medication Sig Dispense Refill  . chlorthalidone (HYGROTON) 25 MG tablet Take 12.5 mg by mouth daily.    . Cholecalciferol (VITAMIN D PO) Take 5,000 Int'l Units/day by mouth.    . Cyanocobalamin 2500 MCG TABS Take 2,500 mcg by mouth daily.     . DULoxetine (CYMBALTA) 30 MG capsule Take 1 capsule (30 mg total) by mouth daily. 30 capsule 11  . gabapentin (NEURONTIN) 300 MG capsule Take 4 capsules (1,200 mg total) by mouth at bedtime. Please call [phone] to schedule follow up or may request from PCP. 360 capsule 4  . rOPINIRole (REQUIP) 0.5 MG tablet Take 2 tablets (1 mg total) by mouth at bedtime. 60 tablet 11  . rosuvastatin (CRESTOR) 5 MG tablet Take 5 mg by mouth daily.     No facility-administered medications prior to visit.    PAST MEDICAL HISTORY: Past Medical History:  Diagnosis Date  . Cholelithiasis with chronic cholecystitis without biliary obstruction 12/12/2017  . Gallstones   . History of decreased platelet count   . Hyperlipidemia   . Hypertension   . Incarcerated umbilical hernia 4/54/0981  . Umbilical hernia     PAST SURGICAL HISTORY: Past Surgical History:  Procedure Laterality Date  . CHOLECYSTECTOMY N/A 12/12/2017   Procedure: LAPAROSCOPIC CHOLECYSTECTOMY WITH INTRAOPERATIVE CHOLANGIOGRAM ERAS PATHWAY;  Surgeon: Fanny Skates, MD;  Location: [place];  Service: General;  Laterality: N/A;  . EYE SURGERY  1964   lazy eye  . no colonoscopy     03/26/13  & 04/28/14 SOC reviewed  . SHOULDER SURGERY  2012   Left ; Dr Onnie Graham  . UMBILICAL HERNIA REPAIR N/A 12/12/2017   Procedure: REPAIR UMBILICAL HERNIA;  Surgeon: Fanny Skates, MD;  Location: [place];  Service: General;  Laterality: N/A;    FAMILY HISTORY: Family History  Problem Relation Age of Onset  . Diabetes Mother   . Hyperlipidemia Mother   . Hypertension Mother    . Thyroid nodules Mother   . Testicular cancer Brother 62  . Diabetes Brother   . Prostate cancer Father 35  . Stroke Neg Hx   . Heart disease Neg Hx   . Colon cancer Neg Hx   . Stomach cancer Neg Hx     SOCIAL HISTORY: Social History   Socioeconomic History  . Marital status: Married    Spouse name: [name]  . Number of children: 4  . Years of education: college  . Highest education level: Not on file  Occupational History  . Occupation: Pharmacist, hospital: SELF EMPLOYED  Tobacco Use  . Smoking status: Never Smoker  . Smokeless tobacco: Never Used  Vaping Use  . Vaping Use: Never used  Substance and Sexual Activity  . Alcohol use: Yes  Comment: occ  . Drug use: No  . Sexual activity: Yes    Partners: Male    Birth control/protection: Post-menopausal, Surgical    Comment: vasectomy  Other Topics Concern  . Not on file  Social History Narrative   Patient lives at home with her husband [name]). Patient works full time.   Education college   Right handed.   Caffeine sometimes one cup of sweet tea.   Social Determinants of Health   Financial Resource Strain: Not on file  Food Insecurity: Not on file  Transportation Needs: Not on file  Physical Activity: Not on file  Stress: Not on file  Social Connections: Not on file  Intimate Partner Violence: Not on file

## 2020-08-28 ENCOUNTER — Encounter: Payer: BC Managed Care – PPO | Admitting: Neurology

## 2020-08-30 NOTE — Procedures (Signed)
Full Name: Erica Daugherty Gender: Female MRN #: [id number] Date of Birth: 07-May-1960    Visit Date: 08/21/2020 09:43 Age: 60 Years Examining Physician: Marcial Pacas, MD  Referring Physician: Marcial Pacas, MD History: Long history of peripheral neuropathy, bilateral lower extremity paresthesia, status post IVIG treatment, evaluate for progression  Summary of the test: Nerve conduction study: Bilateral sural, superficial peroneal sensory responses were absent.  Left ulnar, radial sensory response showed significantly decreased snap amplitude, with normal peak latency.  Bilateral tibial motor responses showed significantly decreased CMAP amplitude with decreased conduction velocity.  Bilateral peroneal to EDB motor response showed mildly decreased CMAP amplitude, with moderately decreased conduction velocity.  Left ulnar motor responses were normal.  Left ulnar F-wave latency was normal.  Bilateral tibial motor responses were prolonged.  Electromyography Selected needle examination was performed at bilateral lower extremity muscles, bilateral lumbosacral paraspinal muscles, there was evidence of mildly decreased recruitment patterns at bilateral distal leg muscles, there was no evidence of spontaneous activity at bilateral lumbosacral paraspinals.  Conclusion: This is an abnormal study.  There is continued evidence of peripheral neuropathy, axonal feature dominant, likely a demyelinating component.  Compared to previous study in May 2021, there was no significant change.    Marcial Pacas, M.D. PhD  [facility] [address] [place], [place] [address] Tel: [phone] Fax: [phone]  Verbal informed consent was obtained from the patient, patient was informed of potential risk of procedure, including bruising, bleeding, hematoma formation, infection, muscle weakness, muscle pain, numbness, among others.        [place]    Nerve / Sites Muscle Latency Ref.  Amplitude Ref. Rel Amp Segments Distance Velocity Ref. Area    ms ms mV mV %  cm m/s m/s mVms  L Ulnar - ADM     Wrist ADM 2.8 ?3.3 8.4 ?6.0 100 Wrist - ADM 7   32.7     B.Elbow ADM 6.3  7.0  83.5 B.Elbow - Wrist 19 53 ?49 29.2     A.Elbow ADM 8.3  6.4  91.4 A.Elbow - B.Elbow 10 51 ?49 28.9  L Peroneal - EDB     Ankle EDB 3.6 ?6.5 1.9 ?2.0 100 Ankle - EDB 8   5.6     Fib head EDB 11.7  1.0  53.7 Fib head - Ankle 29 36 ?44 3.2     Pop fossa EDB 14.4  0.8  82.6 Pop fossa - Fib head 10 36 ?44 3.8         Pop fossa - Ankle      R Peroneal - EDB     Ankle EDB 3.7 ?6.5 1.5 ?2.0 100 Ankle - EDB 8   4.0     Fib head EDB 11.9  1.2  85.7 Fib head - Ankle 29 35 ?44 3.0     Pop fossa EDB 14.7  1.2  94.2 Pop fossa - Fib head 10 35 ?44 3.0         Pop fossa - Ankle      L Tibial - AH     Ankle AH 3.7 ?5.8 0.8 ?4.0 100 Ankle - AH 7   3.0     Pop fossa AH 15.2  0.5  57.7 Pop fossa - Ankle 39 34 ?41 1.6  R Tibial - AH     Ankle AH 3.9 ?5.8 0.9 ?4.0 100 Ankle - AH 7   2.9     Pop fossa AH  15.0  0.3  35.5 Pop fossa - Ankle 39 35 ?41 1.3                     SNC    Nerve / Sites Rec. Site Peak Lat Ref.  Amp Ref. Segments Distance    ms ms V V  cm  L Radial - Anatomical snuff box (Forearm)     Forearm Wrist 2.7 ?2.9 5 ?15 Forearm - Wrist 10  L Sural - Ankle (Calf)     Calf Ankle NR ?4.4 NR ?6 Calf - Ankle 14  R Sural - Ankle (Calf)     Calf Ankle NR ?4.4 NR ?6 Calf - Ankle 14  L Superficial peroneal - Ankle     Lat leg Ankle NR ?4.4 NR ?6 Lat leg - Ankle 14  R Superficial peroneal - Ankle     Lat leg Ankle NR ?4.4 NR ?6 Lat leg - Ankle 14  L Ulnar - Orthodromic, (Dig V, Mid palm)     Dig V Wrist 2.9 ?3.1 2 ?5 Dig V - Wrist 14                 F  Wave    Nerve F Lat Ref.   ms ms  L Tibial - AH 72.2 ?56.0  R Tibial - AH 71.1 ?56.0  L Ulnar - ADM 30.5 ?32.0           EMG Summary Table    Spontaneous MUAP Recruitment  Muscle IA Fib PSW Fasc Other Amp Dur. Poly Pattern  R. Tibialis  anterior Normal None None None _______ Normal Normal Normal Reduced  R. Tibialis posterior Normal None None None _______ Normal Normal Normal Reduced  R. Peroneus longus Normal None None None _______ Normal Normal Normal Reduced  R. Vastus lateralis Normal None None None _______ Normal Normal Normal Normal  R. Abductor hallucis Increased 1+ None None _______ Normal Normal Normal Reduced  L. Vastus lateralis Normal None None None _______ Normal Normal Normal Normal  L. Tibialis anterior Normal None None None _______ Normal Normal Normal Reduced  L. Tibialis posterior Normal None None None _______ Normal Normal Normal Reduced  L. Peroneus longus Normal None None None _______ Normal Normal Normal Reduced  L. Gastrocnemius (Medial head) Normal None None None _______ Normal Normal Normal Reduced  L. Abductor hallucis Increased 1+ None None _______ Normal Normal Normal Reduced  R. Lumbar paraspinals (low) Normal None None None _______ Normal Normal Normal Normal  R. Lumbar paraspinals (mid) Normal None None None _______ Normal Normal Normal Normal  L. Lumbar paraspinals (low) Normal None None None _______ Normal Normal Normal Normal  L. Lumbar paraspinals (mid) Normal None None None _______ Normal Normal Normal Normal

## 2020-12-18 DIAGNOSIS — M25511 Pain in right shoulder: Secondary | ICD-10-CM | POA: Diagnosis not present

## 2020-12-20 DIAGNOSIS — Z1231 Encounter for screening mammogram for malignant neoplasm of breast: Secondary | ICD-10-CM | POA: Diagnosis not present

## 2021-01-22 DIAGNOSIS — M7541 Impingement syndrome of right shoulder: Secondary | ICD-10-CM | POA: Diagnosis not present

## 2021-01-22 DIAGNOSIS — M25511 Pain in right shoulder: Secondary | ICD-10-CM | POA: Diagnosis not present

## 2021-02-04 DIAGNOSIS — M25511 Pain in right shoulder: Secondary | ICD-10-CM | POA: Diagnosis not present

## 2021-02-13 DIAGNOSIS — H5213 Myopia, bilateral: Secondary | ICD-10-CM | POA: Diagnosis not present

## 2021-02-26 DIAGNOSIS — M7541 Impingement syndrome of right shoulder: Secondary | ICD-10-CM | POA: Diagnosis not present

## 2021-02-27 DIAGNOSIS — H18452 Nodular corneal degeneration, left eye: Secondary | ICD-10-CM | POA: Diagnosis not present

## 2021-03-06 ENCOUNTER — Ambulatory Visit: Payer: BC Managed Care – PPO | Admitting: Neurology

## 2021-03-28 DIAGNOSIS — M25521 Pain in right elbow: Secondary | ICD-10-CM | POA: Diagnosis not present

## 2021-03-28 DIAGNOSIS — M25561 Pain in right knee: Secondary | ICD-10-CM | POA: Diagnosis not present

## 2021-04-03 DIAGNOSIS — M13841 Other specified arthritis, right hand: Secondary | ICD-10-CM | POA: Diagnosis not present

## 2021-04-03 DIAGNOSIS — M13842 Other specified arthritis, left hand: Secondary | ICD-10-CM | POA: Diagnosis not present

## 2021-04-13 ENCOUNTER — Encounter (HOSPITAL_COMMUNITY): Payer: Self-pay

## 2021-04-13 ENCOUNTER — Ambulatory Visit (INDEPENDENT_AMBULATORY_CARE_PROVIDER_SITE_OTHER): Payer: BC Managed Care – PPO

## 2021-04-13 ENCOUNTER — Other Ambulatory Visit: Payer: Self-pay

## 2021-04-13 ENCOUNTER — Ambulatory Visit (HOSPITAL_COMMUNITY)
Admission: EM | Admit: 2021-04-13 | Discharge: 2021-04-13 | Disposition: A | Discharge status: Home / Self Care | Payer: BC Managed Care – PPO | Attending: Physician Assistant | Admitting: Physician Assistant

## 2021-04-13 DIAGNOSIS — I10 Essential (primary) hypertension: Secondary | ICD-10-CM | POA: Diagnosis not present

## 2021-04-13 DIAGNOSIS — R051 Acute cough: Secondary | ICD-10-CM

## 2021-04-13 DIAGNOSIS — R059 Cough, unspecified: Secondary | ICD-10-CM

## 2021-04-13 DIAGNOSIS — J40 Bronchitis, not specified as acute or chronic: Secondary | ICD-10-CM

## 2021-04-13 DIAGNOSIS — J329 Chronic sinusitis, unspecified: Secondary | ICD-10-CM | POA: Diagnosis not present

## 2021-04-13 MED ORDER — PROMETHAZINE-DM 6.25-15 MG/5ML PO SYRP
5.0000 mL | ORAL_SOLUTION | Freq: Two times a day (BID) | ORAL | 0 refills | Status: DC | PRN
Start: 2021-04-13 — End: 2021-04-21

## 2021-04-13 MED ORDER — AMOXICILLIN-POT CLAVULANATE 875-125 MG PO TABS: 1.0000 | ORAL_TABLET | Freq: Two times a day (BID) | ORAL | 0 refills | Status: DC

## 2021-04-13 MED ORDER — PREDNISONE 20 MG PO TABS: 40.0000 mg | ORAL_TABLET | Freq: Every day | ORAL | 0 refills | Status: AC

## 2021-04-13 NOTE — Discharge Instructions (Signed)
Your chest x-ray was normal.  I have called in Augmentin to cover for sinus and bronchitis infection.  This can upset your stomach so take it with food.  I have also called in prednisone for 3 days.  You should not take NSAIDs including aspirin, ibuprofen/Advil, naproxen/Aleve with this medication as it can cause stomach bleeding.  You can use Promethazine DM twice daily for cough.  This will make you sleepy so do not drive or drink alcohol while taking it.  Use Mucinex, Flonase, Tylenol for additional symptom relief.  Make sure you rest and drink plenty of fluid.  If you have persistent symptoms you need to see your primary care doctor or return to our clinic.  If you develop any shortness of breath, chest pain, nausea/vomiting interfering with oral intake, fever, weakness you need to go to the emergency room.

## 2021-04-13 NOTE — ED Triage Notes (Signed)
Pt c/o cough x2wks and now having headaches and upper back pain. States cough is worse at night. Taking Mucinex DM with no relief.

## 2021-04-13 NOTE — ED Provider Notes (Signed)
[facility]    CSN: [id number] Arrival date & time: 04/13/21  1012      History   Chief Complaint Chief Complaint  Patient presents with   Cough    HPI Erica Daugherty is a 60 y.o. female.   Patient presents today with a 2-week history of productive cough.  She reports some nasal congestion, reports cough is her primary symptom.  Denies fever, chest pain, shortness of breath, nausea, vomiting.  She is coughing so much that it is causing back pain.  She has tried Mucinex DM without improvement of symptoms.  She denies history of asthma, allergies, COPD, smoking.  She denies any recent antibiotic use.  She has had COVID-19 vaccine but has not yet had this years flu shot.  She denies any known sick contacts.  She is having difficulty with daily duties as result of symptoms.  Blood pressure is very elevated today.  Patient is unsure if she has taken her blood pressure medication yet this morning.  She denies any chest pain, shortness of breath, headache, vision changes, dizziness.  She has not been taking decongestants and denies any increased sodium or caffeine intake.  She does have a primary care provider as well as the ability to monitor her blood pressure at home.   Past Medical History:  Diagnosis Date   Cholelithiasis with chronic cholecystitis without biliary obstruction 12/12/2017   Gallstones    History of decreased platelet count    Hyperlipidemia    Hypertension    Incarcerated umbilical hernia 9/37/1696   Umbilical hernia     Patient Active Problem List   Diagnosis Date Noted   Restless leg syndrome 01/18/2020   Gait abnormality 08/30/2019   Mixed axonal-demyelinating polyneuropathy 08/30/2019   Neuropathic pain of foot 08/05/2019   Cholelithiasis with chronic cholecystitis without biliary obstruction 12/12/2017   Incarcerated umbilical hernia 78/93/8101   Postmenopausal HRT (hormone replacement therapy) 08/11/2015   Peripheral neuropathy 10/06/2013    Hyperlipidemia 03/24/2012   THORACIC/LUMBOSACRAL NEURITIS/RADICULITIS UNSPEC 06/02/2008    Past Surgical History:  Procedure Laterality Date   CHOLECYSTECTOMY N/A 12/12/2017   Procedure: LAPAROSCOPIC CHOLECYSTECTOMY WITH INTRAOPERATIVE CHOLANGIOGRAM ERAS PATHWAY;  Surgeon: Fanny Skates, MD;  Location: [facility];  Service: General;  Laterality: N/A;   [place]   lazy eye   no colonoscopy     03/26/13  & 04/28/14 [place] reviewed   SHOULDER SURGERY  2012   Left ; Dr Onnie Graham   UMBILICAL HERNIA REPAIR N/A 12/12/2017   Procedure: REPAIR UMBILICAL HERNIA;  Surgeon: Fanny Skates, MD;  Location: [facility];  Service: General;  Laterality: N/A;    OB History     Gravida  4   Para  4   Term  4   Preterm  0   AB  0   Living  4      SAB  0   IAB  0   Ectopic  0   Multiple  0   Live Births  4            Home Medications    Prior to Admission medications   Medication Sig Start Date End Date Taking? Authorizing Provider  amoxicillin-clavulanate (AUGMENTIN) 875-125 MG tablet Take 1 tablet by mouth every 12 (twelve) hours. 04/13/21  Yes Charolett Yarrow K, PA-C  predniSONE (DELTASONE) 20 MG tablet Take 2 tablets (40 mg total) by mouth daily for 3 days. 04/13/21 04/16/21 Yes Isella Slatten, Derry Skill, PA-C  promethazine-dextromethorphan (PROMETHAZINE-DM) 6.25-15 MG/5ML  syrup Take 5 mLs by mouth 2 (two) times daily as needed for cough. 04/13/21  Yes Corona Popovich K, PA-C  chlorthalidone (HYGROTON) 25 MG tablet Take 12.5 mg by mouth daily. 09/28/17   [provider]  Cholecalciferol (VITAMIN D PO) Take 5,000 Int'l Units/day by mouth.    [provider]  Cyanocobalamin 2500 MCG TABS Take 2,500 mcg by mouth daily.     [provider]  DULoxetine (CYMBALTA) 60 MG capsule Take 1 capsule (60 mg total) by mouth daily. 08/21/20   Marcial Pacas, MD  gabapentin (NEURONTIN) 300 MG capsule Take 4 capsules (1,200 mg total) by mouth at bedtime. 08/21/20   Marcial Pacas, MD  rOPINIRole  (REQUIP) 1 MG tablet Take 2 tablets (2 mg total) by mouth at bedtime. 08/21/20   Marcial Pacas, MD  rosuvastatin (CRESTOR) 5 MG tablet Take 5 mg by mouth daily. 02/20/18   [provider]    Family History Family History  Problem Relation Age of Onset   Diabetes Mother    Hyperlipidemia Mother    Hypertension Mother    Thyroid nodules Mother    Testicular cancer Brother 69   Diabetes Brother    Prostate cancer Father 61   Stroke Neg Hx    Heart disease Neg Hx    Colon cancer Neg Hx    Stomach cancer Neg Hx     Social History Social History   Tobacco Use   Smoking status: Never   Smokeless tobacco: Never  Vaping Use   Vaping Use: Never used  Substance Use Topics   Alcohol use: Yes    Comment: occ   Drug use: No     Allergies   Patient has no known allergies.   Review of Systems Review of Systems  Constitutional:  Positive for activity change. Negative for appetite change, fatigue and fever.  HENT:  Positive for congestion. Negative for sinus pressure, sneezing and sore throat.   Eyes:  Negative for visual disturbance.  Respiratory:  Positive for cough. Negative for shortness of breath.   Cardiovascular:  Negative for chest pain.  Gastrointestinal:  Negative for abdominal pain, diarrhea, nausea and vomiting.  Musculoskeletal:  Negative for arthralgias and myalgias.  Neurological:  Negative for dizziness, light-headedness and headaches.    Physical Exam Triage Vital Signs ED Triage Vitals  Enc Vitals Group     BP 04/13/21 1045 (!) 167/87     Pulse Rate 04/13/21 1045 (!) 104     Resp 04/13/21 1045 18     Temp 04/13/21 1045 99.7 F (37.6 C)     Temp Source 04/13/21 1045 Oral     SpO2 04/13/21 1045 99 %     Weight --      Height --      Head Circumference --      Peak Flow --      Pain Score 04/13/21 1046 7     Pain Loc --      Pain Edu? --      Excl. in [place]? --    No data found.  Updated Vital Signs BP (!) 167/87 (BP Location: Left Arm)    Pulse  (!) 104    Temp 99.7 F (37.6 C) (Oral)    Resp 18    LMP 10/07/2016 (Exact Date)    SpO2 99%   Visual Acuity Right Eye Distance:   Left Eye Distance:   Bilateral Distance:    Right Eye Near:   Left Eye Near:  Bilateral Near:     Physical Exam Vitals reviewed.  Constitutional:      General: She is awake. She is not in acute distress.    Appearance: Normal appearance. She is well-developed. She is not ill-appearing.     Comments: Very pleasant female appears stated age in no acute distress sitting comfortably in exam room table  HENT:     Head: Normocephalic and atraumatic.     Right Ear: Tympanic membrane, ear canal and external ear normal. Tympanic membrane is not erythematous or bulging.     Left Ear: Tympanic membrane, ear canal and external ear normal. Tympanic membrane is not erythematous or bulging.     Nose:     Right Sinus: No maxillary sinus tenderness or frontal sinus tenderness.     Left Sinus: No maxillary sinus tenderness or frontal sinus tenderness.     Mouth/Throat:     Pharynx: Uvula midline. No oropharyngeal exudate or posterior oropharyngeal erythema.  Cardiovascular:     Rate and Rhythm: Normal rate and regular rhythm.     Heart sounds: Normal heart sounds, S1 normal and S2 normal. No murmur heard. Pulmonary:     Effort: Pulmonary effort is normal.     Breath sounds: Normal breath sounds. No wheezing, rhonchi or rales.     Comments: Clear to auscultation bilaterally Psychiatric:        Behavior: Behavior is cooperative.     UC Treatments / Results  Labs (all labs ordered are listed, but only abnormal results are displayed) Labs Reviewed - No data to display  EKG   Radiology DG Chest 2 View  Result Date: 04/13/2021 CLINICAL DATA:  Worsening cough for 2 weeks EXAM: CHEST - 2 VIEW COMPARISON:  03/16/2010 FINDINGS: The heart size and mediastinal contours are within normal limits. Both lungs are clear. The visualized skeletal structures are  unremarkable. IMPRESSION: No active cardiopulmonary disease. Electronically Signed   By: Kathreen Devoid M.D.   On: 04/13/2021 11:08    Procedures Procedures (including critical care time)  Medications Ordered in UC Medications - No data to display  Initial Impression / Assessment and Plan / UC Course  I have reviewed the triage vital signs and the nursing notes.  Pertinent labs & imaging results that were available during my care of the patient were reviewed by me and considered in my medical decision making (see chart for details).     Discussed elevated blood pressure.  Patient denies any signs/symptoms of endorgan damage.  She will monitor her blood pressure at home and we discussed that if this is persistently above 140/90 she should follow-up with our clinic or her PCP for medication adjustment.  Discussed that if she develops any chest pain, shortness of breath, headache, vision changes, dizziness in the setting of high blood pressure she needs to go to the emergency room.  She is to avoid decongestants, caffeine, sodium.  Chest x-ray was obtained given prolonged and worsening cough which showed no acute abnormality.  Given prolonged and worsening symptoms patient was started on Augmentin twice daily for 7 days.  No indication for viral testing given she has been symptomatic for several weeks.  She was given prednisone burst of 40 mg x 3 days with instruction not to take NSAIDs with this medication.  Prescribed her medicine DM for cough with instruction not to drive or drink alcohol taking this medication as drowsiness is a common side effect.  She can use Mucinex, Flonase, Tylenol for additional symptom relief.  She is to rest and drink plenty of fluid.  Recommended she follow-up with either our clinic or PCP if symptoms do not improve within a week.  She is to go to the emergency room with any worsening symptoms including worsening cough, shortness of breath, chest pain, high fever,  nausea/vomiting interfering with oral intake.  Strict return precautions given to which she expressed understanding.  Final Clinical Impressions(s) / UC Diagnoses   Final diagnoses:  Elevated blood pressure reading in office with diagnosis of hypertension  Sinobronchitis  Acute cough     Discharge Instructions      Your chest x-ray was normal.  I have called in Augmentin to cover for sinus and bronchitis infection.  This can upset your stomach so take it with food.  I have also called in prednisone for 3 days.  You should not take NSAIDs including aspirin, ibuprofen/Advil, naproxen/Aleve with this medication as it can cause stomach bleeding.  You can use Promethazine DM twice daily for cough.  This will make you sleepy so do not drive or drink alcohol while taking it.  Use Mucinex, Flonase, Tylenol for additional symptom relief.  Make sure you rest and drink plenty of fluid.  If you have persistent symptoms you need to see your primary care doctor or return to our clinic.  If you develop any shortness of breath, chest pain, nausea/vomiting interfering with oral intake, fever, weakness you need to go to the emergency room.     ED Prescriptions     Medication Sig Dispense Auth. Provider   promethazine-dextromethorphan (PROMETHAZINE-DM) 6.25-15 MG/5ML syrup Take 5 mLs by mouth 2 (two) times daily as needed for cough. 118 mL Fryda Molenda K, PA-C   predniSONE (DELTASONE) 20 MG tablet Take 2 tablets (40 mg total) by mouth daily for 3 days. 6 tablet Tychelle Purkey K, PA-C   amoxicillin-clavulanate (AUGMENTIN) 875-125 MG tablet Take 1 tablet by mouth every 12 (twelve) hours. 14 tablet Salahuddin Arismendez, Derry Skill, PA-C      PDMP not reviewed this encounter.   Terrilee Croak, PA-C 04/13/21 1124

## 2021-04-21 ENCOUNTER — Other Ambulatory Visit: Payer: Self-pay

## 2021-04-21 ENCOUNTER — Encounter (HOSPITAL_COMMUNITY): Payer: Self-pay | Admitting: *Deleted

## 2021-04-21 ENCOUNTER — Ambulatory Visit (HOSPITAL_COMMUNITY)
Admission: EM | Admit: 2021-04-21 | Discharge: 2021-04-21 | Disposition: A | Discharge status: Home / Self Care | Payer: BC Managed Care – PPO | Attending: Emergency Medicine | Admitting: Emergency Medicine

## 2021-04-21 DIAGNOSIS — R052 Subacute cough: Secondary | ICD-10-CM | POA: Insufficient documentation

## 2021-04-21 DIAGNOSIS — R519 Headache, unspecified: Secondary | ICD-10-CM | POA: Diagnosis not present

## 2021-04-21 DIAGNOSIS — Z20822 Contact with and (suspected) exposure to covid-19: Secondary | ICD-10-CM | POA: Insufficient documentation

## 2021-04-21 DIAGNOSIS — B349 Viral infection, unspecified: Secondary | ICD-10-CM | POA: Diagnosis not present

## 2021-04-21 LAB — POC INFLUENZA A AND B ANTIGEN (URGENT CARE ONLY)
INFLUENZA A ANTIGEN, POC: NEGATIVE
INFLUENZA B ANTIGEN, POC: NEGATIVE

## 2021-04-21 MED ORDER — BENZONATATE 100 MG PO CAPS
100.0000 mg | ORAL_CAPSULE | Freq: Three times a day (TID) | ORAL | 0 refills | Status: DC
Start: 2021-04-21 — End: 2021-05-17

## 2021-04-21 MED ORDER — ALBUTEROL SULFATE HFA 108 (90 BASE) MCG/ACT IN AERS
2.0000 | INHALATION_SPRAY | RESPIRATORY_TRACT | 0 refills | Status: DC | PRN
Start: 2021-04-21 — End: 2021-06-05

## 2021-04-21 MED ORDER — HYDROCODONE BIT-HOMATROP MBR 5-1.5 MG PO TABS: 1.0000 | ORAL_TABLET | Freq: Every evening | ORAL | 0 refills | Status: DC | PRN

## 2021-04-21 NOTE — ED Provider Notes (Signed)
[facility]    CSN: [id number] Arrival date & time: 04/21/21  1001      History   Chief Complaint Chief Complaint  Patient presents with   Cough   Fever    HPI Erica Daugherty is a 60 y.o. female.   Patient presents with fever, rhinorrhea, nonproductive dry cough, intermittent wheezing and intermittent generalized headaches for greater than 2 weeks.  Was seen in urgent care on 12/23 diagnosed with sinus infection, completed prednisone course, Augmentin and use cough syrup which have not relieved symptoms.  Endorses that symptoms have continued to persist with no relief.  Tolerating food and liquids.  Known exposure to the flu within the last week.  Denies chills, body aches, abdominal pain, nausea, vomiting, diarrhea, shortness of breath, ear pain, sore throat.   Past Medical History:  Diagnosis Date   Cholelithiasis with chronic cholecystitis without biliary obstruction 12/12/2017   Gallstones    History of decreased platelet count    Hyperlipidemia    Hypertension    Incarcerated umbilical hernia 7/78/2423   Umbilical hernia     Patient Active Problem List   Diagnosis Date Noted   Restless leg syndrome 01/18/2020   Gait abnormality 08/30/2019   Mixed axonal-demyelinating polyneuropathy 08/30/2019   Neuropathic pain of foot 08/05/2019   Cholelithiasis with chronic cholecystitis without biliary obstruction 12/12/2017   Incarcerated umbilical hernia 53/61/4431   Postmenopausal HRT (hormone replacement therapy) 08/11/2015   Peripheral neuropathy 10/06/2013   Hyperlipidemia 03/24/2012   THORACIC/LUMBOSACRAL NEURITIS/RADICULITIS UNSPEC 06/02/2008    Past Surgical History:  Procedure Laterality Date   CHOLECYSTECTOMY N/A 12/12/2017   Procedure: LAPAROSCOPIC CHOLECYSTECTOMY WITH INTRAOPERATIVE CHOLANGIOGRAM ERAS PATHWAY;  Surgeon: Fanny Skates, MD;  Location: [place];  Service: General;  Laterality: N/A;   [place]   lazy eye   no colonoscopy      03/26/13  & 04/28/14 [place] reviewed   SHOULDER SURGERY  2012   Left ; Dr Onnie Graham   UMBILICAL HERNIA REPAIR N/A 12/12/2017   Procedure: REPAIR UMBILICAL HERNIA;  Surgeon: Fanny Skates, MD;  Location: [place];  Service: General;  Laterality: N/A;    OB History     Gravida  4   Para  4   Term  4   Preterm  0   AB  0   Living  4      SAB  0   IAB  0   Ectopic  0   Multiple  0   Live Births  4            Home Medications    Prior to Admission medications   Medication Sig Start Date End Date Taking? Authorizing Provider  amoxicillin-clavulanate (AUGMENTIN) 875-125 MG tablet Take 1 tablet by mouth every 12 (twelve) hours. 04/13/21   Raspet, Derry Skill, PA-C  chlorthalidone (HYGROTON) 25 MG tablet Take 12.5 mg by mouth daily. 09/28/17   [provider]  Cholecalciferol (VITAMIN D PO) Take 5,000 Int'l Units/day by mouth.    [provider]  Cyanocobalamin 2500 MCG TABS Take 2,500 mcg by mouth daily.     [provider]  DULoxetine (CYMBALTA) 60 MG capsule Take 1 capsule (60 mg total) by mouth daily. 08/21/20   Marcial Pacas, MD  gabapentin (NEURONTIN) 300 MG capsule Take 4 capsules (1,200 mg total) by mouth at bedtime. 08/21/20   Marcial Pacas, MD  promethazine-dextromethorphan (PROMETHAZINE-DM) 6.25-15 MG/5ML syrup Take 5 mLs by mouth 2 (two) times daily as needed for  cough. 04/13/21   Raspet, Junie Panning K, PA-C  rOPINIRole (REQUIP) 1 MG tablet Take 2 tablets (2 mg total) by mouth at bedtime. 08/21/20   Marcial Pacas, MD  rosuvastatin (CRESTOR) 5 MG tablet Take 5 mg by mouth daily. 02/20/18   [provider]    Family History Family History  Problem Relation Age of Onset   Diabetes Mother    Hyperlipidemia Mother    Hypertension Mother    Thyroid nodules Mother    Testicular cancer Brother 58   Diabetes Brother    Prostate cancer Father 39   Stroke Neg Hx    Heart disease Neg Hx    Colon cancer Neg Hx    Stomach cancer Neg Hx     Social History Social  History   Tobacco Use   Smoking status: Never   Smokeless tobacco: Never  Vaping Use   Vaping Use: Never used  Substance Use Topics   Alcohol use: Yes    Comment: occ   Drug use: No     Allergies   Patient has no known allergies.   Review of Systems Review of Systems  Constitutional:  Positive for fever. Negative for activity change, appetite change, chills, diaphoresis, fatigue and unexpected weight change.  HENT:  Positive for rhinorrhea. Negative for congestion, dental problem, drooling, ear discharge, ear pain, facial swelling, hearing loss, mouth sores, nosebleeds, postnasal drip, sinus pressure, sinus pain, sneezing, sore throat, tinnitus, trouble swallowing and voice change.   Respiratory:  Positive for cough and wheezing. Negative for apnea, choking, chest tightness, shortness of breath and stridor.   Cardiovascular: Negative.   Gastrointestinal: Negative.   Skin: Negative.   Neurological:  Positive for headaches. Negative for dizziness, tremors, seizures, syncope, facial asymmetry, speech difficulty, weakness, light-headedness and numbness.    Physical Exam Triage Vital Signs ED Triage Vitals  Enc Vitals Group     BP 04/21/21 1015 (!) 142/81     Pulse Rate 04/21/21 1015 95     Resp 04/21/21 1015 18     Temp 04/21/21 1015 98.2 F (36.8 C)     Temp src --      SpO2 04/21/21 1015 96 %     Weight --      Height --      Head Circumference --      Peak Flow --      Pain Score 04/21/21 1017 0     Pain Loc --      Pain Edu? --      Excl. in [place]? --    No data found.  Updated Vital Signs BP (!) 142/81    Pulse 95    Temp 98.2 F (36.8 C)    Resp 18    LMP 10/07/2016 (Exact Date)    SpO2 96%   Visual Acuity Right Eye Distance:   Left Eye Distance:   Bilateral Distance:    Right Eye Near:   Left Eye Near:    Bilateral Near:     Physical Exam Constitutional:      Appearance: Normal appearance. She is normal weight.  HENT:     Head: Normocephalic.      Right Ear: Tympanic membrane, ear canal and external ear normal.     Left Ear: Tympanic membrane, ear canal and external ear normal.     Nose: Congestion present.     Mouth/Throat:     Mouth: Mucous membranes are moist.     Pharynx: Oropharynx is clear.  Eyes:  Extraocular Movements: Extraocular movements intact.  Cardiovascular:     Rate and Rhythm: Normal rate and regular rhythm.     Pulses: Normal pulses.     Heart sounds: Normal heart sounds.  Pulmonary:     Effort: Pulmonary effort is normal.     Breath sounds: Normal breath sounds.  Musculoskeletal:     Cervical back: Normal range of motion and neck supple.  Skin:    General: Skin is warm and dry.  Neurological:     Mental Status: She is alert and oriented to person, place, and time. Mental status is at baseline.  Psychiatric:        Mood and Affect: Mood normal.        Behavior: Behavior normal.     UC Treatments / Results  Labs (all labs ordered are listed, but only abnormal results are displayed) Labs Reviewed  POC INFLUENZA A AND B ANTIGEN (URGENT CARE ONLY)    EKG   Radiology No results found.  Procedures Procedures (including critical care time)  Medications Ordered in UC Medications - No data to display  Initial Impression / Assessment and Plan / UC Course  I have reviewed the triage vital signs and the nursing notes.  Pertinent labs & imaging results that were available during my care of the patient were reviewed by me and considered in my medical decision making (see chart for details).  Viral illness  Flu test negative, COVID test pending, etiology of symptoms most likely viral, low suspicion of bacterial cause as symptoms should have improved after 7-day course of antibiotic, l discussed with patient ungs are clear, O2 saturation 96% on room air, chest x-ray at last visit was negative, prescribed Tessalon, albuterol inhaler hydrocodone bit-homatroprine to assist with coughing, PDMP reviewed,  low risk, follow-up as needed Final Clinical Impressions(s) / UC Diagnoses   Final diagnoses:  None   Discharge Instructions   None    ED Prescriptions   None    PDMP not reviewed this encounter.   Hans Eden, NP 04/21/21 1113

## 2021-04-21 NOTE — ED Triage Notes (Signed)
Pt seen on 12-23 and has been taking meds but still has Cough ,fever . Pt reports she has not improved.

## 2021-04-21 NOTE — ED Triage Notes (Signed)
Pt reported some Family members have been tested for flu after a [place] gathering. Pt wants a flu and COVID test

## 2021-04-21 NOTE — Discharge Instructions (Addendum)
Your flu test negative, COVID test is pending, you will any positive results  Your symptoms today are most likely being caused by a virus, symptoms are most likely not bacterial due to your recent use of an antibiotic and symptoms continuing to persist  In addition to your cough syrup I will prescribe Tessalon which she may use every 8 hours to help calm coughing  You may use inhaler every 4 hours to assist with wheezing  You may use hydrocodone bid, throat pill at bedtime to help calm your coughing and allow you to sleep    You can take Tylenol and/or Ibuprofen as needed for fever reduction and pain relief.   For cough: honey 1/2 to 1 teaspoon (you can dilute the honey in water or another fluid).  You can also use guaifenesin and dextromethorphan for cough. You can use a humidifier for chest congestion and cough.  If you don't have a humidifier, you can sit in the bathroom with the hot shower running.      For sore throat: try warm salt water gargles, cepacol lozenges, throat spray, warm tea or water with lemon/honey, popsicles or ice, or OTC cold relief medicine for throat discomfort.   For congestion: take a daily anti-histamine like Zyrtec, Claritin, and a oral decongestant, such as pseudoephedrine.  You can also use Flonase 1-2 sprays in each nostril daily.   It is important to stay hydrated: drink plenty of fluids (water, gatorade/powerade/pedialyte, juices, or teas) to keep your throat moisturized and help further relieve irritation/discomfort.

## 2021-04-22 LAB — SARS CORONAVIRUS 2 (TAT 6-24 HRS): SARS Coronavirus 2: NEGATIVE

## 2021-05-03 DIAGNOSIS — M13841 Other specified arthritis, right hand: Secondary | ICD-10-CM | POA: Diagnosis not present

## 2021-05-03 DIAGNOSIS — M13842 Other specified arthritis, left hand: Secondary | ICD-10-CM | POA: Diagnosis not present

## 2021-05-17 ENCOUNTER — Ambulatory Visit (HOSPITAL_COMMUNITY)
Admission: EM | Admit: 2021-05-17 | Discharge: 2021-05-17 | Disposition: A | Discharge status: Home / Self Care | Payer: BC Managed Care – PPO | Attending: Family Medicine | Admitting: Family Medicine

## 2021-05-17 ENCOUNTER — Encounter (HOSPITAL_COMMUNITY): Payer: Self-pay

## 2021-05-17 DIAGNOSIS — Z20822 Contact with and (suspected) exposure to covid-19: Secondary | ICD-10-CM | POA: Insufficient documentation

## 2021-05-17 DIAGNOSIS — J069 Acute upper respiratory infection, unspecified: Secondary | ICD-10-CM | POA: Diagnosis not present

## 2021-05-17 DIAGNOSIS — I10 Essential (primary) hypertension: Secondary | ICD-10-CM | POA: Insufficient documentation

## 2021-05-17 DIAGNOSIS — Z79899 Other long term (current) drug therapy: Secondary | ICD-10-CM | POA: Insufficient documentation

## 2021-05-17 DIAGNOSIS — U071 COVID-19: Secondary | ICD-10-CM | POA: Diagnosis not present

## 2021-05-17 NOTE — ED Provider Notes (Signed)
[facility]    CSN: [id number] Arrival date & time: 05/17/21  1928      History   Chief Complaint Chief Complaint  Patient presents with   Headache    Cough x 2-3 Expose to covid    HPI Erica Daugherty is a 61 y.o. female.    Headache For 2-day history of cough and congestion.  No fever.  She does discover that she was exposed to someone with COVID a few days ago when she was on a convention trip.  No vomiting or diarrhea.  She did a home test that was expired and it was positive.  She does have a history of hypertension and is on chlorthalidone.  Her EGFR is in the 50s on testing done in epic Past Medical History:  Diagnosis Date   Cholelithiasis with chronic cholecystitis without biliary obstruction 12/12/2017   Gallstones    History of decreased platelet count    Hyperlipidemia    Hypertension    Incarcerated umbilical hernia 1/70/0174   Umbilical hernia     Patient Active Problem List   Diagnosis Date Noted   Restless leg syndrome 01/18/2020   Gait abnormality 08/30/2019   Mixed axonal-demyelinating polyneuropathy 08/30/2019   Neuropathic pain of foot 08/05/2019   Cholelithiasis with chronic cholecystitis without biliary obstruction 12/12/2017   Incarcerated umbilical hernia 94/49/6759   Postmenopausal HRT (hormone replacement therapy) 08/11/2015   Peripheral neuropathy 10/06/2013   Hyperlipidemia 03/24/2012   THORACIC/LUMBOSACRAL NEURITIS/RADICULITIS UNSPEC 06/02/2008    Past Surgical History:  Procedure Laterality Date   CHOLECYSTECTOMY N/A 12/12/2017   Procedure: LAPAROSCOPIC CHOLECYSTECTOMY WITH INTRAOPERATIVE CHOLANGIOGRAM ERAS PATHWAY;  Surgeon: Fanny Skates, MD;  Location: [place];  Service: General;  Laterality: N/A;   [place]   lazy eye   no colonoscopy     03/26/13  & 04/28/14 [place] reviewed   SHOULDER SURGERY  2012   Left ; Dr Onnie Graham   UMBILICAL HERNIA REPAIR N/A 12/12/2017   Procedure: REPAIR UMBILICAL HERNIA;  Surgeon:  Fanny Skates, MD;  Location: [place];  Service: General;  Laterality: N/A;    OB History     Gravida  4   Para  4   Term  4   Preterm  0   AB  0   Living  4      SAB  0   IAB  0   Ectopic  0   Multiple  0   Live Births  4            Home Medications    Prior to Admission medications   Medication Sig Start Date End Date Taking? Authorizing Provider  albuterol (VENTOLIN HFA) 108 (90 Base) MCG/ACT inhaler Inhale 2 puffs into the lungs every 4 (four) hours as needed for wheezing or shortness of breath. 04/21/21   White, Leitha Schuller, NP  chlorthalidone (HYGROTON) 25 MG tablet Take 12.5 mg by mouth daily. 09/28/17   [provider]  Cholecalciferol (VITAMIN D PO) Take 5,000 Int'l Units/day by mouth.    [provider]  Cyanocobalamin 2500 MCG TABS Take 2,500 mcg by mouth daily.     [provider]  DULoxetine (CYMBALTA) 60 MG capsule Take 1 capsule (60 mg total) by mouth daily. 08/21/20   Marcial Pacas, MD  gabapentin (NEURONTIN) 300 MG capsule Take 4 capsules (1,200 mg total) by mouth at bedtime. 08/21/20   Marcial Pacas, MD  rOPINIRole (REQUIP) 1 MG tablet Take 2 tablets (2 mg total)  by mouth at bedtime. 08/21/20   Marcial Pacas, MD  rosuvastatin (CRESTOR) 5 MG tablet Take 5 mg by mouth daily. 02/20/18   [provider]    Family History Family History  Problem Relation Age of Onset   Diabetes Mother    Hyperlipidemia Mother    Hypertension Mother    Thyroid nodules Mother    Testicular cancer Brother 31   Diabetes Brother    Prostate cancer Father 1   Stroke Neg Hx    Heart disease Neg Hx    Colon cancer Neg Hx    Stomach cancer Neg Hx     Social History Social History   Tobacco Use   Smoking status: Never   Smokeless tobacco: Never  Vaping Use   Vaping Use: Never used  Substance Use Topics   Alcohol use: Yes    Comment: occ   Drug use: No     Allergies   Patient has no known allergies.   Review of Systems Review of  Systems  Neurological:  Positive for headaches.    Physical Exam Triage Vital Signs ED Triage Vitals  Enc Vitals Group     BP 05/17/21 1936 (!) 166/90     Pulse Rate 05/17/21 1936 88     Resp 05/17/21 1936 16     Temp 05/17/21 1936 98.3 F (36.8 C)     Temp Source 05/17/21 1936 Oral     SpO2 05/17/21 1936 100 %     Weight --      Height --      Head Circumference --      Peak Flow --      Pain Score 05/17/21 1938 0     Pain Loc --      Pain Edu? --      Excl. in [place]? --    No data found.  Updated Vital Signs BP (!) 166/90 (BP Location: Left Arm)    Pulse 88    Temp 98.3 F (36.8 C) (Oral)    Resp 16    LMP 10/07/2016 (Exact Date)    SpO2 100%   Visual Acuity Right Eye Distance:   Left Eye Distance:   Bilateral Distance:    Right Eye Near:   Left Eye Near:    Bilateral Near:     Physical Exam Vitals reviewed.  Constitutional:      General: She is not in acute distress.    Appearance: She is not toxic-appearing.  HENT:     Right Ear: Tympanic membrane and ear canal normal.     Left Ear: Tympanic membrane and ear canal normal.     Nose: Nose normal.     Mouth/Throat:     Mouth: Mucous membranes are moist.     Pharynx: No oropharyngeal exudate or posterior oropharyngeal erythema.  Eyes:     Extraocular Movements: Extraocular movements intact.     Conjunctiva/sclera: Conjunctivae normal.     Pupils: Pupils are equal, round, and reactive to light.  Cardiovascular:     Rate and Rhythm: Normal rate and regular rhythm.     Heart sounds: No murmur heard. Pulmonary:     Effort: Pulmonary effort is normal. No respiratory distress.     Breath sounds: No wheezing, rhonchi or rales.  Musculoskeletal:     Cervical back: Neck supple.  Lymphadenopathy:     Cervical: No cervical adenopathy.  Skin:    Capillary Refill: Capillary refill takes less than 2 seconds.  Coloration: Skin is not jaundiced or pale.  Neurological:     General: No focal deficit present.      Mental Status: She is alert and oriented to person, place, and time.  Psychiatric:        Behavior: Behavior normal.     UC Treatments / Results  Labs (all labs ordered are listed, but only abnormal results are displayed) Labs Reviewed  SARS CORONAVIRUS 2 (TAT 6-24 HRS)    EKG   Radiology No results found.  Procedures Procedures (including critical care time)  Medications Ordered in UC Medications - No data to display  Initial Impression / Assessment and Plan / UC Course  I have reviewed the triage vital signs and the nursing notes.  Pertinent labs & imaging results that were available during my care of the patient were reviewed by me and considered in my medical decision making (see chart for details).     Repeat the test with her PCR since her test was expired.  Strongly suspect she really is positive.  She is at risk for severe disease if her COVID is positive.  Her EGFR was mildly reduced at 50 in epic.  If she has positive, I would prescribe her the renal dosing of paxlovid Final Clinical Impressions(s) / UC Diagnoses   Final diagnoses:  Viral upper respiratory tract infection     Discharge Instructions       You have been swabbed for COVID, and the test will result in the next 24 hours. Our staff will call you if positive. If the test is positive, you should quarantine for 5 days.  If positive I would recommend you are taking an antiviral to keep your COVID from becoming worse     ED Prescriptions   None    PDMP not reviewed this encounter.   Barrett Henle, MD 05/17/21 2000

## 2021-05-17 NOTE — Discharge Instructions (Addendum)
°  You have been swabbed for COVID, and the test will result in the next 24 hours. Our staff will call you if positive. If the test is positive, you should quarantine for 5 days.  If positive I would recommend you are taking an antiviral to keep your COVID from becoming worse

## 2021-05-17 NOTE — ED Triage Notes (Signed)
Pt here with headache and cough x 2 days. She was expose to covid and would like to be tested.

## 2021-05-18 LAB — SARS CORONAVIRUS 2 (TAT 6-24 HRS): SARS Coronavirus 2: POSITIVE — AB

## 2021-05-19 ENCOUNTER — Telehealth (HOSPITAL_COMMUNITY): Payer: Self-pay

## 2021-05-19 NOTE — Telephone Encounter (Signed)
Attempted to reached patient regarding message left yesterday regarding covid testing. No answer or voice mail.

## 2021-06-05 ENCOUNTER — Encounter: Payer: Self-pay | Admitting: Neurology

## 2021-06-05 ENCOUNTER — Other Ambulatory Visit: Payer: Self-pay

## 2021-06-05 ENCOUNTER — Ambulatory Visit (INDEPENDENT_AMBULATORY_CARE_PROVIDER_SITE_OTHER): Payer: BC Managed Care – PPO | Admitting: Neurology

## 2021-06-05 VITALS — BP 138/85 | HR 91 | Ht 66.5 in | Wt 241.5 lb

## 2021-06-05 DIAGNOSIS — G2581 Restless legs syndrome: Secondary | ICD-10-CM | POA: Diagnosis not present

## 2021-06-05 DIAGNOSIS — R269 Unspecified abnormalities of gait and mobility: Secondary | ICD-10-CM | POA: Diagnosis not present

## 2021-06-05 DIAGNOSIS — G6289 Other specified polyneuropathies: Secondary | ICD-10-CM

## 2021-06-05 MED ORDER — METANX 3-90.314-2-35 MG PO CAPS: 1.0000 | ORAL_CAPSULE | Freq: Two times a day (BID) | ORAL | 11 refills | Status: DC

## 2021-06-05 NOTE — Patient Instructions (Addendum)
You can take Duloxetine 60mg  at 5pm Keep Gabapentin 300mg x4 at night time Ropinirole 1mg  x2 at night time--- decrease if possible  2  Gabapentin 300mg  at 5pm Keep Gabapentin 300mg x3at night time+Duloxetine 60mg   Ropinirole 1mg  x2 at night time  3 Duloxetine 60mg  at 5pm Keep Gabapentin 300mg x4 at night time Ropinirole 1mg  x1 at night time  [facility] [address], [place], [address] [address] [phone]  [email or web address]  Call or text with a customer service representative [phone]  Metanx one capsule twice a day [facility] [place], [place] ordered this encounter  Medications   L-Methylfolate-Algae-B12-B6 (METANX) 3-90.314-2-35 MG CAPS    Sig: Take 1 capsule by mouth 2 (two) times daily.    Dispense:  60 capsule    Refill:  11

## 2021-06-05 NOTE — Progress Notes (Signed)
ASSESSMENT AND PLAN  61 y.o. year old female  Peripheral neuropathy   That was confirmed by previous EMG nerve conduction study in June 2015, and repeat study May 2021, which showed evidence of mixed axonal and demyelinating neuropathy, with prolonged bilateral tibial, left ulnar F-wave latency, temporal dispersion of bilateral tibial motor responses in the setting of low CMAP amplitude.  On examination (Aug 25 2019), she is areflexia, decreased vibratory sensation to mid shin level, absent toe proprioception, decreased fingertip proprioception, positive Romberg signs, mild weakness of bilateral toe flexion, extension weakness  She has gradual onset, slow progressive symptoms since 2015,  CSF May 2021, total protein of 38,   IVIG treatment, loading dose January 03, 2020, for more than 6 treatment now, there was no significant clinical and electrodiagnostic improvement, she is also concerned about the high cost associated with IVIG treatment, we decided to stop IVIG  Moved Cymbalta to 60 mg daily Metnax 1 tablets twice a day,  Restless leg symptoms  Continue gabapentin 300 mg 4 tablets at nighttime, and Requip 1 mg 2 tablets at bedtime   DIAGNOSTIC DATA (LABS, IMAGING, TESTING) - I reviewed patient records, labs, notes, testing and imaging myself where available.   HISTORY OF PRESENT ILLNESS: Erica Daugherty is a 61 years old right-handed Caucasian female, referred by her primary care physician Dr. Unice Cobble, and pain management Dr. 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 She complains of 6 months history of bilateral feet paresthesia, at plantar surface, pad, numbness, burning sensation, needle prick,  She denies weakness, getting sore after weight bearing. She does have low back pain, going  down her legs. She denies gait difficulty, no fingers paresthesia, no incontinence, Laboratory in December 2014, which has demonstrates normal CMP, CBC, TSH, LDL was elevated 152  UPDATE July 9th 2015:She is taking Gabapentin $RemoveBeforeDEI'100mg'[id number]$  bid, which does help her symptoms, Labs showed normal Z61, folic acid, RPR, mild elevated A1c 5.7, normal electrophoresis.   UPDATE Mar 10 2018: She was seen by NP Hoyle Sauer over past few years, she continues to complains of plantar feet swollen, was seen by vein specialist, She denies low back pain, painful to walk, she is now see urologist for urinary incontinence,   She was taking gabapentin 400 mg every night, which has been helpful, symptomatic during the day, gabapentin makes her very sleepy,  UPDATE August 05 2019: Over the years, Sharlett continue complains of bilateral feet burning pain, especially late afternoon and nighttime, burning painful, difficulty sleeping, she often has to rubbing her feet together or using her hands to wrapping her lower extremity to ease up the discomfort.  She has been taking gabapentin 400 mg +100 mg 3 tablets every night with partial relief of her symptoms, she could not tolerate daytime dosage due to significant sleepiness  She denies upper extremity involvement, denies gait abnormality, has urinary urgency, but denies significant neck, low back pain.  UPDATE Aug 30 2019: Patient return for electrodiagnostic study today, which confirmed the diagnosis of chronic neuropathy, there is evidence of mixed demyelinating and axonal features, as evident by prolonged F-wave latency at bilateral tibial, and the left ulnar motor response, there is also evidence of temporal dispersion at bilateral tibial motor responses, in the setting of decreased CMAP amplitude.  On examination, she was areflexia, decreased vibratory sensation to  mid shin level, absent toe proprioception, positive Romberg signs, mild bilateral toe flexion, extension  weakness  Extensive laboratory evaluations showed no treatable etiology, mild elevated A1c 5.7, normal B12, copper, vitamin D, ESR, CPK, TSH, folic acid, C-reactive protein, RPR, HIV, CBC, CMP, ANA, ferritin, protein electrophoresis,  UPDATE Sept 28 2021: She lived her first dose of IVIG on Sept 13 2021, feel bad for one day, Coreg 12 rest of the days, schedule on Feb 01 2020 for second infusion.  She was added on Cymbalta $RemoveBef'60mg'QMqflUEzHp$  qhs, she has trouble tolerating the medication, make her feel like hang over, even if she take it at nighttime, it does help her bilateral lower extremity deep achy pain, in addition, she takes gabapentin 400 mg 2 tablets every night for restless leg syndrome, sleeps well most of the time, but occasionally has recurrent symptoms urge to move her leg restless at the evening time  Lumbar puncture on Sep 08, 2019, total protein of 38, WBC of 0  Update Aug 21, 2020: She had received a loading dose of IVIG in September 2021, every 3 weeks, had more than 6 treatment, denies significant improvement clinically, also complains of high cost  Repeat electrodiagnostic study today showed no significant difference compared to previous study in May 2021, no continued evidence of peripheral neuropathy, axonal, likely a component of demyelinating,  She also complains of restless leg symptoms, urge to move her leg some of the nights, Requip has been helpful, taking 1 mg 2 tablets every night, higher dose of gabapentin up to 300 mg 4 tablets every night has helped her sleep better  UPDATE Jun 05 2021: She is not taking gabapentin 1200 mg plus Cymbalta 60 mg plus Requip 2 mg before bedtime, she sleeps well, sometimes feel groggy in the morning, take her a while to come out of it  She usually goes to bed at around 11 PM, get up at 7 AM, only aft about 4 hours, by 11 AM she could feel a clear minded  She works as a Geophysicist/field seismologist fall, after prolonged standing, she often feels burning at the  bottom of her feet, to the point of painful, she also complains of mild off-balance sensation   PHYSICAL EXAM  PHYSICAL EXAMNIATION:  Gen: NAD, conversant, well nourised, well groomed NEUROLOGICAL EXAM:  MENTAL STATUS: Speech/Cognition: Awake, alert, normal speech, oriented to history taking and casual conversation.  CRANIAL NERVES: CN II: Visual fields are full to confrontation.  Pupils are round equal and briskly reactive to light. CN III, IV, VI: extraocular movement are normal. No ptosis. CN V: Facial sensation is intact to light touch. CN VII: Face is symmetric with normal eye closure and smile. CN VIII: Hearing is normal to casual conversation CN XI: Head turning and shoulder shrug are intact  MOTOR: Mild bilateral toe flexion, extension weakness; otherwise, no significant muscle weakness noted in upper or lower extremities  REFLEXES: Areflexia  SENSORY: Decreased vibratory sensation to mid shin level,  decreased pinprick to ankle level (improvement from mid shin)  COORDINATION: Finger-nose-finger and heel-to-shin is normal  GAIT/STANCE: Able to stand from seated position without pushoff, mildly antalgic gait when barefoot, difficulty standing up on heels, tiptoe, tandem gait is fairly steady  REVIEW OF SYSTEMS: Full 14 system review of systems performed and notable only for those listed, all others are neg:   See HPI   ALLERGIES: No Known Allergies  HOME MEDICATIONS: Outpatient Medications Prior to Visit  Medication Sig Dispense Refill   chlorthalidone (HYGROTON)  25 MG tablet Take 12.5 mg by mouth daily.     Cholecalciferol (VITAMIN D PO) Take 5,000 Int'l Units/day by mouth.     Cyanocobalamin 2500 MCG TABS Take 2,500 mcg by mouth daily.      DULoxetine (CYMBALTA) 60 MG capsule Take 1 capsule (60 mg total) by mouth daily. 90 capsule 4   gabapentin (NEURONTIN) 300 MG capsule Take 4 capsules (1,200 mg total) by mouth at bedtime. 360 capsule 4   rOPINIRole  (REQUIP) 1 MG tablet Take 2 tablets (2 mg total) by mouth at bedtime. 60 tablet 11   rosuvastatin (CRESTOR) 5 MG tablet Take 5 mg by mouth daily.     albuterol (VENTOLIN HFA) 108 (90 Base) MCG/ACT inhaler Inhale 2 puffs into the lungs every 4 (four) hours as needed for wheezing or shortness of breath. 8 g 0   No facility-administered medications prior to visit.    PAST MEDICAL HISTORY: Past Medical History:  Diagnosis Date   Cholelithiasis with chronic cholecystitis without biliary obstruction 12/12/2017   Gallstones    History of decreased platelet count    Hyperlipidemia    Hypertension    Incarcerated umbilical hernia 2/44/0102   Umbilical hernia     PAST SURGICAL HISTORY: Past Surgical History:  Procedure Laterality Date   CHOLECYSTECTOMY N/A 12/12/2017   Procedure: LAPAROSCOPIC CHOLECYSTECTOMY WITH INTRAOPERATIVE CHOLANGIOGRAM ERAS PATHWAY;  Surgeon: Fanny Skates, MD;  Location: [facility] OR;  Service: General;  Laterality: N/A;   [place]   lazy eye   no colonoscopy     03/26/13  & 04/28/14 [place] reviewed   SHOULDER SURGERY  2012   Left ; Dr Onnie Graham   UMBILICAL HERNIA REPAIR N/A 12/12/2017   Procedure: REPAIR UMBILICAL HERNIA;  Surgeon: Fanny Skates, MD;  Location: [place];  Service: General;  Laterality: N/A;    FAMILY HISTORY: Family History  Problem Relation Age of Onset   Diabetes Mother    Hyperlipidemia Mother    Hypertension Mother    Thyroid nodules Mother    Testicular cancer Brother 93   Diabetes Brother    Prostate cancer Father 78   Stroke Neg Hx    Heart disease Neg Hx    Colon cancer Neg Hx    Stomach cancer Neg Hx     SOCIAL HISTORY: Social History   Socioeconomic History   Marital status: Married    Spouse name: [name]   Number of children: 4   Years of education: college   Highest education level: Not on file  Occupational History   Occupation: Pharmacist, hospital: SELF EMPLOYED  Tobacco Use   Smoking status: Never    Smokeless tobacco: Never  Vaping Use   Vaping Use: Never used  Substance and Sexual Activity   Alcohol use: Yes    Comment: occ   Drug use: No   Sexual activity: Yes    Partners: Male    Birth control/protection: Post-menopausal, Surgical    Comment: vasectomy  Other Topics Concern   Not on file  Social History Narrative   Patient lives at home with her husband [name]). Patient works full time.   Education college   Right handed.   Caffeine sometimes one cup of sweet tea.   Social Determinants of Health   Financial Resource Strain: Not on file  Food Insecurity: Not on file  Transportation Needs: Not on file  Physical Activity: Not on file  Stress: Not on file  Social Connections: Not on file  Intimate Partner Violence: Not on file   Alexia Ladona Mow

## 2021-06-24 ENCOUNTER — Other Ambulatory Visit: Payer: Self-pay | Admitting: Neurology

## 2021-06-25 NOTE — Telephone Encounter (Signed)
Rx refilled.

## 2021-07-02 DIAGNOSIS — R609 Edema, unspecified: Secondary | ICD-10-CM | POA: Diagnosis not present

## 2021-07-02 DIAGNOSIS — R600 Localized edema: Secondary | ICD-10-CM | POA: Diagnosis not present

## 2021-07-02 DIAGNOSIS — I10 Essential (primary) hypertension: Secondary | ICD-10-CM | POA: Diagnosis not present

## 2021-07-10 DIAGNOSIS — D225 Melanocytic nevi of trunk: Secondary | ICD-10-CM | POA: Diagnosis not present

## 2021-07-10 DIAGNOSIS — L821 Other seborrheic keratosis: Secondary | ICD-10-CM | POA: Diagnosis not present

## 2021-07-10 DIAGNOSIS — L820 Inflamed seborrheic keratosis: Secondary | ICD-10-CM | POA: Diagnosis not present

## 2021-07-10 DIAGNOSIS — D2372 Other benign neoplasm of skin of left lower limb, including hip: Secondary | ICD-10-CM | POA: Diagnosis not present

## 2021-08-13 DIAGNOSIS — I10 Essential (primary) hypertension: Secondary | ICD-10-CM | POA: Diagnosis not present

## 2021-08-13 DIAGNOSIS — E782 Mixed hyperlipidemia: Secondary | ICD-10-CM | POA: Diagnosis not present

## 2021-08-15 DIAGNOSIS — I10 Essential (primary) hypertension: Secondary | ICD-10-CM | POA: Diagnosis not present

## 2021-08-15 DIAGNOSIS — G629 Polyneuropathy, unspecified: Secondary | ICD-10-CM | POA: Diagnosis not present

## 2021-08-15 DIAGNOSIS — E782 Mixed hyperlipidemia: Secondary | ICD-10-CM | POA: Diagnosis not present

## 2021-08-19 IMAGING — XA DG SPINAL PUNCT LUMBAR DIAG WITH FL CT GUIDANCE
1 series · 1 of 1 positions shown · non-contrast
Comparison: none

CLINICAL DATA: Gait abnormality.  CIDP.

[Series 1: ortho adipose · 1 of 1 slices shown]
[im 1/1]
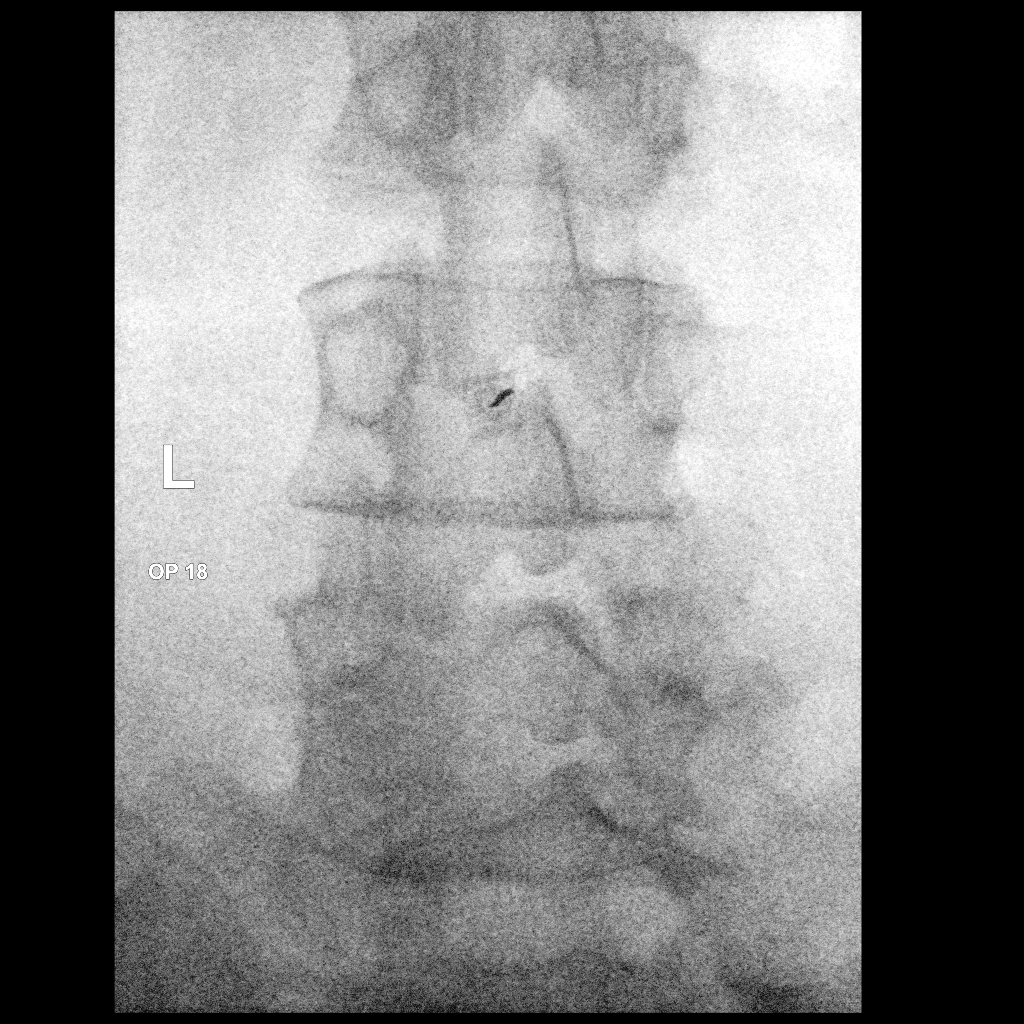

[1 of 1 positions shown; findings below may reference images not displayed]

EXAM:
DIAGNOSTIC LUMBAR PUNCTURE UNDER FLUOROSCOPIC GUIDANCE

FLUOROSCOPY TIME:  Fluoroscopy Time:  8 seconds

Radiation Exposure Index (if provided by the fluoroscopic device):
17.50 microGray*m^2

Number of Acquired Spot Images: 0

PROCEDURE:
Informed consent was obtained from the patient prior to the
procedure, including potential complications of headache, allergy,
and pain. With the patient prone, the lower back was prepped with
Betadine. 1% Lidocaine was used for local anesthesia. Lumbar
puncture was performed at the L3-4 level (considering S1 to be
partially lumbarized) via a left interlaminar approach using a
inch 20 gauge needle with return of clear CSF with an opening
pressure of 18 cm water (measured in the left lateral decubitus
position). 13 mL of CSF were obtained for laboratory studies. The
patient tolerated the procedure well and there were no apparent
complications.
IMPRESSION: Successful fluoroscopically guided lumbar puncture.

## 2021-09-24 ENCOUNTER — Other Ambulatory Visit: Payer: Self-pay | Admitting: Neurology

## 2021-09-24 NOTE — Telephone Encounter (Signed)
Rx refilled.

## 2021-10-25 DIAGNOSIS — N3946 Mixed incontinence: Secondary | ICD-10-CM | POA: Diagnosis not present

## 2021-10-25 DIAGNOSIS — R350 Frequency of micturition: Secondary | ICD-10-CM | POA: Diagnosis not present

## 2021-11-20 DIAGNOSIS — M13842 Other specified arthritis, left hand: Secondary | ICD-10-CM | POA: Diagnosis not present

## 2021-11-20 DIAGNOSIS — M13841 Other specified arthritis, right hand: Secondary | ICD-10-CM | POA: Diagnosis not present

## 2021-11-29 DIAGNOSIS — Z114 Encounter for screening for human immunodeficiency virus [HIV]: Secondary | ICD-10-CM | POA: Diagnosis not present

## 2021-11-29 DIAGNOSIS — Z0000 Encounter for general adult medical examination without abnormal findings: Secondary | ICD-10-CM | POA: Diagnosis not present

## 2021-11-29 DIAGNOSIS — E782 Mixed hyperlipidemia: Secondary | ICD-10-CM | POA: Diagnosis not present

## 2021-12-03 DIAGNOSIS — H6123 Impacted cerumen, bilateral: Secondary | ICD-10-CM | POA: Diagnosis not present

## 2021-12-03 DIAGNOSIS — Z1211 Encounter for screening for malignant neoplasm of colon: Secondary | ICD-10-CM | POA: Diagnosis not present

## 2021-12-03 DIAGNOSIS — Z0000 Encounter for general adult medical examination without abnormal findings: Secondary | ICD-10-CM | POA: Diagnosis not present

## 2021-12-03 DIAGNOSIS — Z23 Encounter for immunization: Secondary | ICD-10-CM | POA: Diagnosis not present

## 2021-12-23 ENCOUNTER — Other Ambulatory Visit: Payer: Self-pay | Admitting: Neurology

## 2021-12-26 DIAGNOSIS — Z1231 Encounter for screening mammogram for malignant neoplasm of breast: Secondary | ICD-10-CM | POA: Diagnosis not present

## 2022-01-23 ENCOUNTER — Other Ambulatory Visit: Payer: Self-pay | Admitting: Neurology

## 2022-02-01 DIAGNOSIS — R7989 Other specified abnormal findings of blood chemistry: Secondary | ICD-10-CM | POA: Diagnosis not present

## 2022-02-01 DIAGNOSIS — E782 Mixed hyperlipidemia: Secondary | ICD-10-CM | POA: Diagnosis not present

## 2022-02-01 DIAGNOSIS — E559 Vitamin D deficiency, unspecified: Secondary | ICD-10-CM | POA: Diagnosis not present

## 2022-02-01 DIAGNOSIS — I10 Essential (primary) hypertension: Secondary | ICD-10-CM | POA: Diagnosis not present

## 2022-02-05 DIAGNOSIS — G629 Polyneuropathy, unspecified: Secondary | ICD-10-CM | POA: Diagnosis not present

## 2022-02-05 DIAGNOSIS — E559 Vitamin D deficiency, unspecified: Secondary | ICD-10-CM | POA: Diagnosis not present

## 2022-02-05 DIAGNOSIS — Z23 Encounter for immunization: Secondary | ICD-10-CM | POA: Diagnosis not present

## 2022-02-05 DIAGNOSIS — I10 Essential (primary) hypertension: Secondary | ICD-10-CM | POA: Diagnosis not present

## 2022-02-05 DIAGNOSIS — E782 Mixed hyperlipidemia: Secondary | ICD-10-CM | POA: Diagnosis not present

## 2022-02-14 DIAGNOSIS — H53143 Visual discomfort, bilateral: Secondary | ICD-10-CM | POA: Diagnosis not present

## 2022-03-26 DIAGNOSIS — H6591 Unspecified nonsuppurative otitis media, right ear: Secondary | ICD-10-CM | POA: Diagnosis not present

## 2022-03-26 DIAGNOSIS — H6123 Impacted cerumen, bilateral: Secondary | ICD-10-CM | POA: Diagnosis not present

## 2022-03-26 DIAGNOSIS — G629 Polyneuropathy, unspecified: Secondary | ICD-10-CM | POA: Diagnosis not present

## 2022-03-26 DIAGNOSIS — I10 Essential (primary) hypertension: Secondary | ICD-10-CM | POA: Diagnosis not present

## 2022-03-26 DIAGNOSIS — R42 Dizziness and giddiness: Secondary | ICD-10-CM | POA: Diagnosis not present

## 2022-04-08 DIAGNOSIS — D2121 Benign neoplasm of connective and other soft tissue of right lower limb, including hip: Secondary | ICD-10-CM | POA: Diagnosis not present

## 2022-04-08 DIAGNOSIS — D485 Neoplasm of uncertain behavior of skin: Secondary | ICD-10-CM | POA: Diagnosis not present

## 2022-04-12 ENCOUNTER — Ambulatory Visit (INDEPENDENT_AMBULATORY_CARE_PROVIDER_SITE_OTHER): Payer: BC Managed Care – PPO | Admitting: Obstetrics and Gynecology

## 2022-04-12 ENCOUNTER — Other Ambulatory Visit: Payer: Self-pay | Admitting: Neurology

## 2022-04-12 ENCOUNTER — Encounter: Payer: Self-pay | Admitting: Obstetrics and Gynecology

## 2022-04-12 VITALS — BP 131/83 | HR 80 | Ht 66.5 in | Wt 253.0 lb

## 2022-04-12 DIAGNOSIS — R350 Frequency of micturition: Secondary | ICD-10-CM | POA: Diagnosis not present

## 2022-04-12 DIAGNOSIS — N812 Incomplete uterovaginal prolapse: Secondary | ICD-10-CM

## 2022-04-12 DIAGNOSIS — N8110 Cystocele, unspecified: Secondary | ICD-10-CM | POA: Diagnosis not present

## 2022-04-12 DIAGNOSIS — R159 Full incontinence of feces: Secondary | ICD-10-CM

## 2022-04-12 DIAGNOSIS — N393 Stress incontinence (female) (male): Secondary | ICD-10-CM | POA: Diagnosis not present

## 2022-04-12 DIAGNOSIS — N816 Rectocele: Secondary | ICD-10-CM

## 2022-04-12 DIAGNOSIS — N3281 Overactive bladder: Secondary | ICD-10-CM | POA: Diagnosis not present

## 2022-04-12 DIAGNOSIS — R151 Fecal smearing: Secondary | ICD-10-CM | POA: Diagnosis not present

## 2022-04-12 DIAGNOSIS — N368 Other specified disorders of urethra: Secondary | ICD-10-CM

## 2022-04-12 LAB — POCT URINALYSIS DIPSTICK
Bilirubin, UA: NEGATIVE
Glucose, UA: NEGATIVE
Ketones, UA: NEGATIVE
Leukocytes, UA: NEGATIVE
Nitrite, UA: NEGATIVE
Protein, UA: POSITIVE — AB
Spec Grav, UA: >=1.03 — AB (ref 1.010–1.025)
Urobilinogen, UA: 0.2 E.U./dL
pH, UA: 5.5 (ref 5.0–8.0)

## 2022-04-12 NOTE — Patient Instructions (Addendum)
Accidental Bowel Leakage: Our goal is to achieve formed bowel movements daily or every-other-day without leakage.  You may need to try different combinations of the following options to find what works best for you.  Some management options include: Dietary changes (more leafy greens, vegetables and fruits; less processed foods) Fiber supplementation (Metamucil or something with psyllium as active ingredient) Over-the-counter imodium (tablets or liquid) to help solidify the stool and prevent leakage of stool. If you get constipated you can use Miralax as needed to achieve bowel movements.  For treatment of stress urinary incontinence, which is leakage with physical activity/movement/strainging/coughing, we discussed expectant management versus nonsurgical options versus surgery. Nonsurgical options include weight loss, physical therapy, as well as a pessary.  Surgical options include a midurethral sling, which is a synthetic mesh sling that acts like a hammock under the urethra to prevent leakage of urine, a Burch urethropexy, and transurethral injection of a bulking agent.   You have a stage 2 (out of 4) prolapse.  We discussed the fact that it is not life threatening but there are several treatment options. For treatment of pelvic organ prolapse, we discussed options for management including expectant management, conservative management, and surgical management, such as Kegels, a pessary, pelvic floor physical therapy, and specific surgical procedures.    URODYNAMICS (UDS) TEST INFORMATION  IMPORTANT: Please try to arrive with a comfortably full bladder!    What is UDS? Urodynamics is a bladder test used to evaluate how your bladder and urethra (tube you urinate out of) work to help find out the cause of your bladder symptoms and evaluate your bladder function in order to make the best treatment plan for you.   What to expect? A nurse will perform the test and will be with you during the entire  exam. First we will have to empty your bladder on a special toilet.  After you have emptied your bladder, very small catheters (plastic tubing) will be placed into your bladder and into your vagina (or rectum). These special small catheters measure pressure to help measure your bladder function.  Your bladder will be gently filled with water and you will be asked to cough and strain at several different points during the test.   You will then be asked to empty your bladder in the special toilet with the catheters in place. Most patients can urinate (pee) easily with the catheters in place since the catheters are so small. In total this procedure lasts about 45 minutes to 1 hour.  After your test is completed, you will return (or possibly be seen the same day) to review the results, talk about treatment options and make a plan moving forward.

## 2022-04-12 NOTE — Progress Notes (Signed)
[facility] New Patient Evaluation and Consultation  Referring Provider: Bjorn Loser, MD PCP: Fanny Bien, MD Date of Service: 04/12/2022  SUBJECTIVE Chief Complaint: New Patient (Initial Visit) Erica Daugherty is a 61 y.o. female here for a consult for incontinence.)  History of Present Illness: Erica Daugherty is a 61 y.o. White or Caucasian female seen in consultation at the request of Dr. Matilde Sprang for evaluation of incontinence and prolape.    Review of records from Dr Lorra Hals significant for: Mixed incontinence, only partially responded to oxybutynin ER '10mg'$ . Also tried myrbetriq, detrol LA '4mg'$  and physical therapy. Has noted suburethral swelling, possible concern for diverticulum. Cystocele and rectocele noted on exam.   Had urodynamic testing in 2019:  - bladder capacity 670m.  - bladder was stable, no DO - stress leakage more notable with prolapse reduced. At 4041m VLPP was 125cm H20 without prolapse reduced. At 60077mCLPP was 50cm H20 with prolapse reduced.  - During voiding, voided 434m57mth max flor of 15ml33mMax voiding pressure 14cm H20. PVR 250ml.76m activity increased with voiding.   Urinary Symptoms: Leaks urine with cough/ sneeze, laughing, exercise, lifting, during sex, with a full bladder, and with urgency. SUI > UUI, happens more when she moves or squats down or with exercise. Unable to exercise due to amount of leakage.  Leaks 5-10 time(s) per day.  Pad use: 1-2 pads per day.   She is bothered by her UI symptoms. Not on any medications currently, did not see a lot of improvement.   Day time voids 5.  Nocturia: 0-1 times per night to void. Voiding dysfunction: she empties her bladder well.  does not use a catheter to empty bladder.  When urinating, she feels dribbling after finishing and to push on her belly or vagina to empty bladder Drinks: water with electrolytes, cheerwine 3-4 times per week, rare tea  UTIs:  0  UTI's in  the last year.   Denies history of blood in urine and kidney or bladder stones  Pelvic Organ Prolapse Symptoms:                  She Denies a feeling of a bulge the vaginal area.   Bowel Symptom: Bowel movements: 1 time(s) per day or every other Stool consistency: hard Straining: yes.  Splinting: no.  Incomplete evacuation: no.  She Admits to accidental bowel leakage / fecal incontinence  Occurs: seldomly, more recently  Consistency with leakage: soft  Bowel regimen: none Last colonoscopy: Date 12/2014, Results polyp removed  Sexual Function Sexually active: yes.  Sexual orientation:  heterosexual Pain with sex: No  Pelvic Pain Denies pelvic pain    Past Medical History:  Past Medical History:  Diagnosis Date   Cholelithiasis with chronic cholecystitis without biliary obstruction 12/12/2017   Gallstones    History of decreased platelet count    Hyperlipidemia    Hypertension    Incarcerated umbilical hernia 12/12/17/14/7829ropathy    Umbilical hernia      Past Surgical History:   Past Surgical History:  Procedure Laterality Date   CHOLECYSTECTOMY N/A 12/12/2017   Procedure: LAPAROSCOPIC CHOLECYSTECTOMY WITH INTRAOPERATIVE CHOLANGIOGRAM ERAS PATHWAY;  Surgeon: IngramFanny Skates Location: [facility] OR;Moffatvice: General;  Laterality: N/A;   EYE SURGERY  04/22/1962   lazy eye   no colonoscopy     03/26/13  & 04/28/14 SOC reviewed   SHOULDER SURGERY  04/22/2010   Left ; Dr SuppleOnnie GrahamILICAL  HERNIA REPAIR N/A 12/12/2017   No mesh. Procedure: REPAIR UMBILICAL HERNIA;  Surgeon: Fanny Skates, MD;  Location: [facility];  Service: General;  Laterality: N/A;     Past OB/GYN History: OB History  Gravida Para Term Preterm AB Living  '4 4 4 '$ 0 0 4  SAB IAB Ectopic Multiple Live Births  0 0 0 0 4    # Outcome Date GA Lbr Len/2nd Weight Sex Delivery Anes PTL Lv  4 Term 1995    M Vag-Spont   LIV  3 Term 30    F Vag-Spont   LIV  2 Term 1990    F Vag-Spont   LIV  1 Term 47     M Vag-Forceps   LIV    Menopausal: around age 74, Had vaginal bleeding shortly after and had workup with GYN.  Any history of abnormal pap smears: no.   Medications: She has a current medication list which includes the following prescription(s): chlorthalidone, vitamin d, cyanocobalamin, duloxetine, gabapentin, metanx, ropinirole, and rosuvastatin.   Allergies: Patient has No Known Allergies.   Social History:  Social History   Tobacco Use   Smoking status: Never   Smokeless tobacco: Never  Vaping Use   Vaping Use: Never used  Substance Use Topics   Alcohol use: Yes    Comment: occ   Drug use: No    Relationship status: married She lives with husband.   She is employed as a Geophysicist/field seismologist. Regular exercise: No History of abuse: No  Family History:   Family History  Problem Relation Age of Onset   Diabetes Mother    Hyperlipidemia Mother    Hypertension Mother    Thyroid nodules Mother    Prostate cancer Father 65   Testicular cancer Brother 43   Diabetes Brother    Stroke Neg Hx    Heart disease Neg Hx    Colon cancer Neg Hx    Stomach cancer Neg Hx      Review of Systems: Review of Systems  Constitutional:  Positive for malaise/fatigue. Negative for fever and weight loss.  Respiratory:  Negative for cough, shortness of breath and wheezing.   Cardiovascular:  Positive for leg swelling. Negative for chest pain and palpitations.  Gastrointestinal:  Negative for abdominal pain and blood in stool.  Genitourinary:  Negative for dysuria.  Musculoskeletal:  Negative for myalgias.  Skin:  Negative for rash.  Neurological:  Negative for dizziness and headaches.  Endo/Heme/Allergies:  Does not bruise/bleed easily.  Psychiatric/Behavioral:  Negative for depression. The patient is not nervous/anxious.      OBJECTIVE Physical Exam: Vitals:   04/12/22 1009  BP: 131/83  Pulse: 80  Weight: 253 lb (114.8 kg)  Height: 5' 6.5" (1.689 m)    Physical  Exam Constitutional:      General: She is not in acute distress. Pulmonary:     Effort: Pulmonary effort is normal.  Abdominal:     General: There is no distension.     Palpations: Abdomen is soft.     Tenderness: There is no abdominal tenderness. There is no rebound.  Musculoskeletal:        General: No swelling. Normal range of motion.  Skin:    General: Skin is warm and dry.     Findings: No rash.  Neurological:     Mental Status: She is alert and oriented to person, place, and time.  Psychiatric:        Mood and Affect: Mood normal.  Behavior: Behavior normal.      GU / Detailed Urogynecologic Evaluation:  Pelvic Exam: Normal external female genitalia; Bartholin's and Skene's glands normal in appearance; urethral meatus normal in appearance, no urethral masses or discharge.   CST: positive  Speculum exam reveals normal vaginal mucosa without atrophy. Cervix normal appearance. Uterus normal single, nontender. Adnexa no mass, fullness, tenderness.     Pelvic floor strength I/V, puborectalis II/V external anal sphincter II/V  Pelvic floor musculature: Right levator non-tender, Right obturator non-tender, Left levator non-tender, Left obturator non-tender  POP-Q:   POP-Q  0                                            Aa   0                                           Ba  -6                                              C   5.5                                            Gh  5                                            Pb  10                                            tvl   -0.5                                            Ap  -0.5                                            Bp  9.5                                              D      Rectal Exam:  Normal sphincter tone, moderate distal rectocele, enterocoele not present, no rectal masses, no sign of dyssynergia when asking the patient to bear down.  Post-Void Residual (PVR) by Bladder Scan: In order to  evaluate bladder emptying, we discussed obtaining a postvoid residual and she agreed to this procedure.  Procedure: The ultrasound unit was placed on the patient's abdomen in the suprapubic region after the patient had voided. A PVR of 62 ml was  obtained by bladder scan.  Laboratory Results: POC urine: trace blood, otherwise negative   ASSESSMENT AND PLAN Ms. Arpin is a 61 y.o. with:  1. SUI (stress urinary incontinence, female)   2. Prolapse of anterior vaginal wall   3. Prolapse of posterior vaginal wall   4. Uterovaginal prolapse, incomplete   5. Incontinence of feces, unspecified fecal incontinence type   6. Overactive bladder   7. Suburethral cyst   8. Urinary frequency    SUI - For treatment of stress urinary incontinence,  non-surgical options include expectant management, weight loss, physical therapy, as well as a pessary.  Surgical options include a midurethral sling, Burch urethropexy, and transurethral injection of a bulking agent. - She is interested in a sling. Will have her return for urodynamic testing. Prior UDS in 2019 showed incomplete bladder emptying.   2. Stage II anterior, Stage II posterior, Stage I apical prolapse - For treatment of pelvic organ prolapse, we discussed options for management including expectant management, conservative management, and surgical management, such as Kegels, a pessary, pelvic floor physical therapy, and specific surgical procedures. - Although she is not symptomatic from her prolapse, we discussed that her anterior prolpase is significant enough that it should be repaired if she wants a sling. We discussed two options for prolapse repair:  1) vaginal repair without mesh - Pros - safer, no mesh complications - Cons - not as strong as mesh repair, higher risk of recurrence - can be done with or without a hysterectomy  2) laparoscopic repair with mesh - Pros - stronger, better long-term success - Cons - risks of mesh implant (erosion  into vagina or bladder, adhering to the rectum, pain) - these risks are lower than with a vaginal mesh but still exist - Handouts provided so she will consider her options.   3. Accidental Bowel Leakage:  - Treatment options include anti-diarrhea medication (loperamide/ Imodium OTC or prescription lomotil), fiber supplements, physical therapy, and possible sacral neuromodulation or surgery.   - She will start with a tablespoon of psyllium fiber daily for stool bulking.   4. OAB - symptoms not as bothersome, has tried several medications without improvement. Will defer treatment until after SUI management.   5. Suburethral cyst - not appreciated on exam today, but there was possible concern for diverticulum on prior vaginal exams and VUDS at [facility].  - Will proceed with MRI of pelvis to assess for urethral diverticulum  Return for urodynamic testing   Jaquita Folds, MD   Medical Decision Making:  - Reviewed/ ordered a clinical laboratory test - Reviewed/ ordered a radiologic study - Reviewed/ ordered medicine test - Review and summation of prior records

## 2022-04-17 DIAGNOSIS — G629 Polyneuropathy, unspecified: Secondary | ICD-10-CM | POA: Diagnosis not present

## 2022-04-17 DIAGNOSIS — I10 Essential (primary) hypertension: Secondary | ICD-10-CM | POA: Diagnosis not present

## 2022-04-21 ENCOUNTER — Encounter (HOSPITAL_COMMUNITY): Payer: Self-pay

## 2022-04-21 ENCOUNTER — Ambulatory Visit (HOSPITAL_COMMUNITY): Payer: BC Managed Care – PPO

## 2022-05-04 ENCOUNTER — Ambulatory Visit
Admission: RE | Admit: 2022-05-04 | Discharge: 2022-05-04 | Disposition: A | Discharge status: Home / Self Care | Payer: BC Managed Care – PPO | Source: Ambulatory Visit | Attending: Obstetrics and Gynecology | Admitting: Obstetrics and Gynecology

## 2022-05-04 DIAGNOSIS — N368 Other specified disorders of urethra: Secondary | ICD-10-CM

## 2022-05-04 DIAGNOSIS — N750 Cyst of Bartholin's gland: Secondary | ICD-10-CM | POA: Diagnosis not present

## 2022-05-04 MED ORDER — GADOPICLENOL 0.5 MMOL/ML IV SOLN
10.0000 mL | Freq: Once | INTRAVENOUS | Status: AC | PRN
  Administered 2022-05-04: 10 mL via INTRAVENOUS

## 2022-05-07 NOTE — Progress Notes (Addendum)
[facility] Urodynamics Procedure  Referring Physician: Fanny Bien, MD Date of Procedure: 1/62/4469  Erica Daugherty is a 62 y.o. female who presents for urodynamic evaluation. Indication(s) for study: SUI and incomplete bladder emptying.   Vital Signs: BP 136/87   Pulse 79   LMP 10/07/2016 (Exact Date)   Laboratory Results: A clean catch urine specimen revealed:  POC urine: Positive for protein and blood, negative for all other components   Voiding Diary: Not performed  Procedure Timeout:  The correct patient was verified and the correct procedure was verified. The patient was in the correct position and safety precautions were reviewed based on at the patient's history.  Urodynamic Procedure A 52F dual lumen urodynamics catheter was placed under sterile conditions into the patient's bladder. A 52F catheter was placed into the rectum in order to measure abdominal pressure. EMG patches were placed in the appropriate position.  All connections were confirmed and calibrations/adjusted made. Saline was instilled into the bladder through the dual lumen catheters.  Cough/valsalva pressures were measured periodically during filling.  Patient was allowed to void.  The bladder was then emptied of its residual.  UROFLOW: Revealed a Qmax of   mL/sec.  She voided 36 mL and had a residual of 5 mL.  It was a normal pattern and represented normal habits though interpretation limited due to low voided volume.  CMG: This was performed with sterile water in the sitting position at a fill rate of 30 mL/min.    First sensation of fullness was 130 mLs,  First urge was 284 mLs,  Strong urge was 342 mLs and  Capacity was 845 mLs  Stress incontinence was demonstrated Lowest positive Barrier CLPP was 114 cmH20 at 286 ml. Lowest positive Barrier VLPP was 65 cmH20 at 286 ml.  Detrusor function was overactive, with phasic contractions seen.  The first occurred at 321 mL to 6.6 cm  of water and was associated with urge.  Compliance:  Normal. End fill detrusor pressure was 20.6 cmH20.  Calculated compliance was 41 [id number]/[id number]  UPP: MUCP with barrier reduction was 52 cm of water.    MICTURITION STUDY: Voiding was performed with reduction using scopettes in the sitting position.  Pdet at Qmax was 0 cm of water.  Qmax was 37.8 mL/sec.  It was a normal pattern.  She voided 840 mL and had a residual of 5 mL.  It was a volitional void, minimal non-sustained detrusor contraction was present and abdominal straining was present  EMG: This was performed with patches.  She had voluntary contractions, recruitment with fill was present and urethral sphincter was relaxed with void.  The details of the procedure with the study tracings have been scanned into EPIC.   Urodynamic Impression:  1. Sensation was normal; capacity was increased 2. Stress Incontinence was demonstrated at normal pressures; 3. Detrusor Overactivity was demonstrated without leakage. 4. Emptying was dysfunctional with a normal PVR, a minimal non-sustained detrusor contraction present,  abdominal straining present, normal urethral sphincter activity on EMG.  Plan: - The patient will follow up  to discuss the findings and treatment options.

## 2022-05-08 ENCOUNTER — Ambulatory Visit (INDEPENDENT_AMBULATORY_CARE_PROVIDER_SITE_OTHER): Payer: BC Managed Care – PPO | Admitting: Obstetrics and Gynecology

## 2022-05-08 ENCOUNTER — Encounter: Payer: Self-pay | Admitting: Obstetrics and Gynecology

## 2022-05-08 VITALS — BP 136/87 | HR 79

## 2022-05-08 DIAGNOSIS — R350 Frequency of micturition: Secondary | ICD-10-CM

## 2022-05-08 DIAGNOSIS — N3281 Overactive bladder: Secondary | ICD-10-CM | POA: Diagnosis not present

## 2022-05-08 DIAGNOSIS — N393 Stress incontinence (female) (male): Secondary | ICD-10-CM | POA: Diagnosis not present

## 2022-05-08 LAB — POCT URINALYSIS DIPSTICK
Bilirubin, UA: NEGATIVE
Blood, UA: NEGATIVE
Glucose, UA: NEGATIVE
Ketones, UA: NEGATIVE
Leukocytes, UA: NEGATIVE
Nitrite, UA: NEGATIVE
Protein, UA: POSITIVE — AB
Spec Grav, UA: >=1.03 — AB (ref 1.010–1.025)
Urobilinogen, UA: 0.2 E.U./dL
pH, UA: 5 (ref 5.0–8.0)

## 2022-05-08 NOTE — Patient Instructions (Signed)
Taking Care of Yourself after Urodynamics   Drink plenty of water for a day or two following your procedure. Try to have about 8 ounces (one cup) at a time, and do this 6 times or more per day unless you have fluid restrictitons AVOID irritative beverages such as coffee, tea, soda, alcoholic or citrus drinks for a day or two, as this may cause burning with urination. You may experience some discomfort or a burning sensation with urination after having this procedure. You can use over the counter Azo or pyridium to help with burning and follow the instructions on the packaging. If it does not improve within 1-2 days, or other symptoms appear (fever, chills, or difficulty urinating) call the office to speak to a nurse.  You may return to normal daily activities such as work, school, driving, exercising and housework on the day of the procedure.    

## 2022-05-16 DIAGNOSIS — M13841 Other specified arthritis, right hand: Secondary | ICD-10-CM | POA: Diagnosis not present

## 2022-05-16 DIAGNOSIS — M13842 Other specified arthritis, left hand: Secondary | ICD-10-CM | POA: Diagnosis not present

## 2022-05-24 ENCOUNTER — Ambulatory Visit: Payer: BC Managed Care – PPO | Admitting: Obstetrics and Gynecology

## 2022-05-27 DIAGNOSIS — I10 Essential (primary) hypertension: Secondary | ICD-10-CM | POA: Diagnosis not present

## 2022-05-27 DIAGNOSIS — G629 Polyneuropathy, unspecified: Secondary | ICD-10-CM | POA: Diagnosis not present

## 2022-06-05 ENCOUNTER — Encounter: Payer: Self-pay | Admitting: Neurology

## 2022-06-05 ENCOUNTER — Ambulatory Visit (INDEPENDENT_AMBULATORY_CARE_PROVIDER_SITE_OTHER): Payer: BC Managed Care – PPO | Admitting: Neurology

## 2022-06-05 VITALS — BP 132/85 | HR 77 | Ht 66.0 in | Wt 253.0 lb

## 2022-06-05 DIAGNOSIS — R269 Unspecified abnormalities of gait and mobility: Secondary | ICD-10-CM

## 2022-06-05 DIAGNOSIS — G2581 Restless legs syndrome: Secondary | ICD-10-CM

## 2022-06-05 DIAGNOSIS — R0683 Snoring: Secondary | ICD-10-CM

## 2022-06-05 DIAGNOSIS — G6289 Other specified polyneuropathies: Secondary | ICD-10-CM

## 2022-06-05 MED ORDER — DULOXETINE HCL 60 MG PO CPEP: 60.0000 mg | ORAL_CAPSULE | Freq: Every day | ORAL | 4 refills | Status: DC

## 2022-06-05 MED ORDER — ROPINIROLE HCL 1 MG PO TABS: 2.0000 mg | ORAL_TABLET | Freq: Every day | ORAL | 3 refills | Status: DC

## 2022-06-05 NOTE — Patient Instructions (Signed)
Referral for sleep study, rule out sleep apnea Referral for physical therapy for gait training Keep current medications See you back in 1 year

## 2022-06-05 NOTE — Progress Notes (Signed)
ASSESSMENT AND PLAN  62 y.o. year old female  1.  Peripheral neuropathy  2.  Restless leg syndrome That was confirmed by previous EMG nerve conduction study in June 2015, and repeat study May 2021, which showed evidence of mixed axonal and demyelinating neuropathy, with prolonged bilateral tibial, left ulnar F-wave latency, temporal dispersion of bilateral tibial motor responses in the setting of low CMAP amplitude.  On examination (Aug 25 2019), she is areflexia, decreased vibratory sensation to mid shin level, absent toe proprioception, decreased fingertip proprioception, positive Romberg signs, mild weakness of bilateral toe flexion, extension weakness  She has gradual onset, slow progressive symptoms since 2015,  CSF May 2021, total protein of 38,   IVIG treatment, loading dose January 03, 2020, for more than 6 treatment now, there was no significant clinical and electrodiagnostic improvement, she is also concerned about the high cost associated with IVIG treatment, we decided to stop IVIG since Feb 2023  Overall feels symptoms stable, no worsening  Referral to neurorehab for physical therapy for gait training  She is reducing the dose of gabapentin trying 300 mg, 3 tablets at bedtime versus 4, continue Cymbalta 60 mg at 5 PM, Metanx 1 capsule twice daily, Requip 1 mg 2 tablets at bedtime  3.  Snoring, daytime fatigue, drowsiness -Referral for sleep consult, ESS 17, BMI 40, rule out obstructive sleep apnea -Of note, she is trying to reduce gabapentin to see if contributing to morning symptoms   DIAGNOSTIC DATA (LABS, IMAGING, TESTING) - I reviewed patient records, labs, notes, testing and imaging myself where available.   HISTORY OF PRESENT ILLNESS: Erica Daugherty is a 62 years old right-handed Caucasian female, referred by her primary care physician Dr. Unice Cobble, and pain management Dr. Roxy Horseman for evaluation of numbness in feet,  Electrodiagnostic study on October 06 2013,  demonstrated a mild length dependent axonal sensory motor polyneuropathy She has past medical history of obesity but denies history of diabetes. She complains of 6 months history of bilateral feet paresthesia, at plantar surface, pad, numbness, burning sensation, needle prick,  She denies weakness, getting sore after weight bearing. She does have low back pain, going down her legs. She denies gait difficulty, no fingers paresthesia, no incontinence, Laboratory in December 2014, which has demonstrates normal CMP, CBC, TSH, LDL was elevated 152  UPDATE July 9th 2015:She is taking Gabapentin 145m bid, which does help her symptoms, Labs showed normal [id number] folic acid, RPR, mild elevated A1c 5.7, normal electrophoresis.   UPDATE Mar 10 2018: She was seen by NP CHoyle Sauerover past few years, she continues to complains of plantar feet swollen, was seen by vein specialist, She denies low back pain, painful to walk, she is now see urologist for urinary incontinence,   She was taking gabapentin 400 mg every night, which has been helpful, symptomatic during the day, gabapentin makes her very sleepy,  UPDATE August 05 2019: Over the years, ASundracontinue complains of bilateral feet burning pain, especially late afternoon and nighttime, burning painful, difficulty sleeping, she often has to rubbing her feet together or using her hands to wrapping her lower extremity to ease up the discomfort.  She has been taking gabapentin 400 mg +100 mg 3 tablets every night with partial relief of her symptoms, she could not tolerate daytime dosage due to significant sleepiness  She denies upper extremity involvement, denies gait abnormality, has urinary urgency, but denies significant neck, low back pain.  UPDATE Aug 30 2019: Patient return for  electrodiagnostic study today, which confirmed the diagnosis of chronic neuropathy, there is evidence of mixed demyelinating and axonal features, as evident by prolonged F-wave latency  at bilateral tibial, and the left ulnar motor response, there is also evidence of temporal dispersion at bilateral tibial motor responses, in the setting of decreased CMAP amplitude.  On examination, she was areflexia, decreased vibratory sensation to mid shin level, absent toe proprioception, positive Romberg signs, mild bilateral toe flexion, extension weakness  Extensive laboratory evaluations showed no treatable etiology, mild elevated A1c 5.7, normal B12, copper, vitamin D, ESR, CPK, TSH, folic acid, C-reactive protein, RPR, HIV, CBC, CMP, ANA, ferritin, protein electrophoresis,  UPDATE Sept 28 2021: She lived her first dose of IVIG on Sept 13 2021, feel bad for one day, Coreg 12 rest of the days, schedule on Feb 01 2020 for second infusion.  She was added on Cymbalta 60m qhs, she has trouble tolerating the medication, make her feel like hang over, even if she take it at nighttime, it does help her bilateral lower extremity deep achy pain, in addition, she takes gabapentin 400 mg 2 tablets every night for restless leg syndrome, sleeps well most of the time, but occasionally has recurrent symptoms urge to move her leg restless at the evening time  Lumbar puncture on Sep 08, 2019, total protein of 38, WBC of 0  Update Aug 21, 2020: She had received a loading dose of IVIG in September 2021, every 3 weeks, had more than 6 treatment, denies significant improvement clinically, also complains of high cost  Repeat electrodiagnostic study today showed no significant difference compared to previous study in May 2021, no continued evidence of peripheral neuropathy, axonal, likely a component of demyelinating,  She also complains of restless leg symptoms, urge to move her leg some of the nights, Requip has been helpful, taking 1 mg 2 tablets every night, higher dose of gabapentin up to 300 mg 4 tablets every night has helped her sleep better  UPDATE Jun 05 2021: She is not taking gabapentin 1200 mg  plus Cymbalta 60 mg plus Requip 2 mg before bedtime, she sleeps well, sometimes feel groggy in the morning, take her a while to come out of it  She usually goes to bed at around 11 PM, get up at 7 AM, only aft about 4 hours, by 11 AM she could feel a clear minded  She works as a pGeophysicist/field seismologistfall, after prolonged standing, she often feels burning at the bottom of her feet, to the point of painful, she also complains of mild off-balance sensation  Update June 05, 2022 SS: Off IVIG for about a year, feels overall stable, has tried to reduce gabapentin 300 mg to 3 tablets at night. In the morning feels foggy, feels gabapentin makes her groggy, feels fine on lower dose, around 5 PM feet hurt, bottoms feel like hot needles. Balance is terrible, is very cautious. Hold the rail when holding her grandson on steps, turning the shower with eyes closed, gets off balance. Still on Cymbalta 60 mg 5 PM in the afternoon. Uses topical creams. She mentions sleep apnea concern, snoring, cotton mouth, daytime fatigue, drowsiness, would love to take a nap, struggles to get going in the morning. Also on Requip, Metanx.  ESS 17.  PHYSICAL EXAM  PHYSICAL EXAMNIATION:  Gen: NAD, conversant, well nourised, well groomed NEUROLOGICAL EXAM:  MENTAL STATUS: Speech/Cognition: Awake, alert, normal speech, oriented to history taking and casual conversation.  CRANIAL NERVES: CN II: Visual fields are  full to confrontation.  Pupils are round equal and briskly reactive to light. CN III, IV, VI: extraocular movement are normal. No ptosis. CN V: Facial sensation is intact to light touch. CN VII: Face is symmetric with normal eye closure and smile. CN VIII: Hearing is normal to casual conversation CN XI: Head turning and shoulder shrug are intact  MOTOR: Mild dorsiflexion weakness, plantar flexion bilateral, otherwise strong  REFLEXES: Areflexia  SENSORY: Decreased vibratory sensation to mid shin level,  decreased  pinprick to ankle level (improvement from mid shin)  COORDINATION: Finger-nose-finger and heel-to-shin is normal  GAIT/STANCE: Able to stand from seated position without pushoff,   REVIEW OF SYSTEMS: Full 14 system review of systems performed and notable only for those listed, all others are neg:   See HPI   ALLERGIES: No Known Allergies  HOME MEDICATIONS: Outpatient Medications Prior to Visit  Medication Sig Dispense Refill   chlorthalidone (HYGROTON) 25 MG tablet Take 12.5 mg by mouth daily.     Cholecalciferol (VITAMIN D PO) Take 5,000 Int'l Units/day by mouth.     Cyanocobalamin 2500 MCG TABS Take 2,500 mcg by mouth daily.      DULoxetine (CYMBALTA) 60 MG capsule Take 1 capsule (60 mg total) by mouth daily. 90 capsule 4   gabapentin (NEURONTIN) 300 MG capsule TAKE FOUR CAPSULES BY MOUTH AT BEDTIME 360 capsule 4   L-Methylfolate-Algae-B12-B6 (METANX) 3-90.314-2-35 MG CAPS TAKE 1 CAPSULE BY MOUTH TWO TIMES A DAY 180 capsule 2   rOPINIRole (REQUIP) 1 MG tablet TAKE 2 TABLETS BY MOUTH AT BEDTIME. 60 tablet 2   rosuvastatin (CRESTOR) 5 MG tablet Take 5 mg by mouth daily.     No facility-administered medications prior to visit.    PAST MEDICAL HISTORY: Past Medical History:  Diagnosis Date   Cholelithiasis with chronic cholecystitis without biliary obstruction 12/12/2017   Gallstones    History of decreased platelet count    Hyperlipidemia    Hypertension    Incarcerated umbilical hernia [id number]   Neuropathy    Umbilical hernia     PAST SURGICAL HISTORY: Past Surgical History:  Procedure Laterality Date   CHOLECYSTECTOMY N/A 12/12/2017   Procedure: LAPAROSCOPIC CHOLECYSTECTOMY WITH INTRAOPERATIVE CHOLANGIOGRAM ERAS PATHWAY;  Surgeon: Fanny Skates, MD;  Location: [facility];  Service: General;  Laterality: N/A;   EYE SURGERY  04/22/1962   lazy eye   no colonoscopy     03/26/13  & 04/28/14 [facility] reviewed   SHOULDER SURGERY  04/22/2010   Left ; Dr Onnie Graham   UMBILICAL  HERNIA REPAIR N/A 12/12/2017   No mesh. Procedure: REPAIR UMBILICAL HERNIA;  Surgeon: Fanny Skates, MD;  Location: [facility];  Service: General;  Laterality: N/A;    FAMILY HISTORY: Family History  Problem Relation Age of Onset   Diabetes Mother    Hyperlipidemia Mother    Hypertension Mother    Thyroid nodules Mother    Prostate cancer Father 50   Testicular cancer Brother 44   Diabetes Brother    Stroke Neg Hx    Heart disease Neg Hx    Colon cancer Neg Hx    Stomach cancer Neg Hx     SOCIAL HISTORY: Social History   Socioeconomic History   Marital status: Married    Spouse name: [name]   Number of children: 4   Years of education: college   Highest education level: Not on file  Occupational History   Occupation: Pharmacist, hospital: SELF EMPLOYED  Tobacco Use  Smoking status: Never   Smokeless tobacco: Never  Vaping Use   Vaping Use: Never used  Substance and Sexual Activity   Alcohol use: Yes    Comment: occ   Drug use: No   Sexual activity: Yes    Partners: Male    Birth control/protection: Post-menopausal, Surgical    Comment: vasectomy  Other Topics Concern   Not on file  Social History Narrative   Patient lives at home with her husband [name]). Patient works full time.   Education college   Right handed.   Caffeine sometimes one cup of sweet tea.   Social Determinants of Health   Financial Resource Strain: Not on file  Food Insecurity: Not on file  Transportation Needs: Not on file  Physical Activity: Not on file  Stress: Not on file  Social Connections: Not on file  Intimate Partner Violence: Not on file    Butler Denmark, Laqueta Jean, [facility] [address], [place], [place] [address] [phone]

## 2022-06-10 ENCOUNTER — Encounter: Payer: Self-pay | Admitting: *Deleted

## 2022-06-10 ENCOUNTER — Inpatient Hospital Stay: Payer: BC Managed Care – PPO | Attending: Genetic Counselor | Admitting: Genetic Counselor

## 2022-06-10 ENCOUNTER — Inpatient Hospital Stay: Payer: BC Managed Care – PPO

## 2022-06-10 DIAGNOSIS — D219 Benign neoplasm of connective and other soft tissue, unspecified: Secondary | ICD-10-CM

## 2022-06-10 DIAGNOSIS — Z8042 Family history of malignant neoplasm of prostate: Secondary | ICD-10-CM | POA: Diagnosis not present

## 2022-06-10 DIAGNOSIS — Z8049 Family history of malignant neoplasm of other genital organs: Secondary | ICD-10-CM

## 2022-06-11 ENCOUNTER — Encounter: Payer: Self-pay | Admitting: Genetic Counselor

## 2022-06-11 NOTE — Progress Notes (Signed)
REFERRING PROVIDER: Danella Sensing, MD [address] [place],  [place] [address]  PRIMARY PROVIDER:  Fanny Bien, MD  PRIMARY REASON FOR VISIT:  1. Angioleiomyoma   2. Family history of prostate cancer   3. Family history of uterine cancer     HISTORY OF PRESENT ILLNESS:   Erica Daugherty, a 62 y.o. female, was seen for a [facility] cancer genetics consultation at the request of Dr. Ronnald Ramp due to a personal history of angioleiomyoma.  Erica Daugherty presents to clinic today to discuss the possibility of a hereditary predisposition to cancer, to discuss genetic testing, and to further clarify her future cancer risks, as well as potential cancer risks for family members.   Erica Daugherty is a 62 y.o. female with no personal history of cancer.  In December 2016, at the age of 19, skin biopsy of right mid central posterior tibia showed angioleiomyoma.  In December 2023, at the age of 30, shave biopsy of right mid central posterior tibia also showed angioleiomyoma.    She denies a personal history of cancer.    RISK FACTORS:  Mammogram within the last year: yes Number of breast biopsies: 0. Colonoscopy: yes; 2016. Hysterectomy: no; denies history of uterine fibroids  Ovaries intact: yes.  Menarche was at age 39.  First live birth at age 11.  OCP use for approximately 4 years.  HRT use: 2 years; stopped more than 5 years ago   Past Medical History:  Diagnosis Date   Cholelithiasis with chronic cholecystitis without biliary obstruction 12/12/2017   Gallstones    History of decreased platelet count    Hyperlipidemia    Hypertension    Incarcerated umbilical hernia [id number]   Neuropathy    Umbilical hernia     Past Surgical History:  Procedure Laterality Date   CHOLECYSTECTOMY N/A 12/12/2017   Procedure: LAPAROSCOPIC CHOLECYSTECTOMY WITH INTRAOPERATIVE CHOLANGIOGRAM ERAS PATHWAY;  Surgeon: Fanny Skates, MD;  Location: [facility];  Service: General;  Laterality: N/A;   EYE SURGERY   04/22/1962   lazy eye   no colonoscopy     03/26/13  & 04/28/14 [facility] reviewed   SHOULDER SURGERY  04/22/2010   Left ; Dr Onnie Graham   UMBILICAL HERNIA REPAIR N/A 12/12/2017   No mesh. Procedure: REPAIR UMBILICAL HERNIA;  Surgeon: Fanny Skates, MD;  Location: [facility];  Service: General;  Laterality: N/A;     FAMILY HISTORY:  We obtained a detailed, 4-generation family history.  Significant diagnoses are listed below: Family History  Problem Relation Age of Onset   Prostate cancer Father 33   Testicular cancer Brother 67   Uterine cancer Paternal Grandmother        dx unknown age      Erica Daugherty is unaware of previous family history of genetic testing for hereditary cancer risks. There is no reported Ashkenazi Jewish ancestry. There is no known consanguinity.  GENETIC COUNSELING ASSESSMENT: Erica Daugherty is a 62 y.o. female with a personal history of multiple biopsies with angioleiomyoma, which is somewhat suggestive of a hereditary cancer syndrome, such as FH tumor predisposition syndrome. We, therefore, discussed and recommended the following at today's visit.   DISCUSSION: We discussed that 5 - 10% of cancer is hereditary.  We discussed that cutaneous leiomyomas can be associated with mutations in the FH gene.  There are other genes that can be associated with hereditary uterine or prostate cancer syndromes.  We discussed that testing is beneficial for several reasons, including knowing about other cancer risks,  identifying potential screening and risk-reduction options that may be appropriate, and to understanding if other family members could be at risk for cancer and allowing them to undergo genetic testing.  We reviewed the characteristics, features and inheritance patterns of hereditary cancer syndromes. We also discussed genetic testing, including the appropriate family members to test, the process of testing, insurance coverage and turn-around-time for results. We discussed the implications  of a negative, positive, and variant of uncertain significant result. Given her personal history of somewhat suggestive of FH tumor predisposition syndrome and there is no available information of maternal family history, we recommended Erica Daugherty pursue genetic testing for a panel that contains FH and genes associated with uterine, prostate, and other cancers.  The CancerNext-Expanded gene panel offered by [facility] and includes sequencing, rearrangement, and RNA analysis for the following 77 genes: AIP, ALK, APC, ATM, AXIN2, BAP1, BARD1, BLM, BMPR1A, BRCA1, BRCA2, BRIP1, CDC73, CDH1, CDK4, CDKN1B, CDKN2A, CHEK2, CTNNA1, DICER1, FANCC, FH, FLCN, GALNT12, KIF1B, LZTR1, MAX, MEN1, MET, MLH1, MSH2, MSH3, MSH6, MUTYH, NBN, NF1, NF2, NTHL1, PALB2, PHOX2B, PMS2, POT1, PRKAR1A, PTCH1, PTEN, RAD51C, RAD51D, RB1, RECQL, RET, SDHA, SDHAF2, SDHB, SDHC, SDHD, SMAD4, SMARCA4, SMARCB1, SMARCE1, STK11, SUFU, TMEM127, TP53, TSC1, TSC2, VHL and XRCC2 (sequencing and deletion/duplication); EGFR, EGLN1, HOXB13, KIT, MITF, PDGFRA, POLD1, and POLE (sequencing only); EPCAM and GREM1 (deletion/duplication only).   We discussed that she may have an out of pocket cost for testing. We discussed that if her out of pocket cost for testing is over $100, the laboratory should contact them to discuss self-pay options and/or patient pay assistance programs.   We discussed the Genetic Information Non-Discrimination Act (GINA) of 2008, which helps protect individuals against genetic discrimination based on their genetic test results.  It impacts both health insurance and employment.  With health insurance, it protects against genetic test results being used for increased premiums or policy termination. For employment, it protects against hiring, firing and promoting decisions based on genetic test results.  GINA does not apply to those in the [employer], those who work for companies with less than 15 employees, and new life insurance or  long-term disability insurance policies.  Health status due to a cancer diagnosis is not protected under GINA.  PLAN: After considering the risks, benefits, and limitations, Erica Daugherty provided informed consent to pursue genetic testing and the blood sample was sent to [facility] for analysis of the CancerNext-Expanded +RNAinsight Panel. Results should be available within approximately 3 weeks' time, at which point they will be disclosed by telephone to Erica Daugherty, as will any additional recommendations warranted by these results. Erica Daugherty will receive a summary of her genetic counseling visit and a copy of her results once available. This information will also be available in Epic.    Erica Daugherty questions were answered to her satisfaction today. Our contact information was provided should additional questions or concerns arise. Thank you for the referral and allowing [place] to share in the care of your patient.   Erica Daugherty M. Joette Catching, [place], [facility] Genetic Counselor [email or web address] (P) [phone]   The patient was seen for a total of 30 minutes in face-to-face genetic counseling.  The patient was seen alone.  Drs. Lindi Adie and/or Burr Medico were available to discuss this case as needed.  _______________________________________________________________________ For Office Staff:  Number of people involved in session: 1 Was an Intern/ student involved with case: no

## 2022-06-19 ENCOUNTER — Ambulatory Visit (INDEPENDENT_AMBULATORY_CARE_PROVIDER_SITE_OTHER): Payer: BC Managed Care – PPO | Admitting: Obstetrics and Gynecology

## 2022-06-19 ENCOUNTER — Encounter: Payer: Self-pay | Admitting: Obstetrics and Gynecology

## 2022-06-19 ENCOUNTER — Ambulatory Visit: Payer: BC Managed Care – PPO | Attending: Neurology

## 2022-06-19 VITALS — BP 133/80 | HR 78

## 2022-06-19 DIAGNOSIS — R2689 Other abnormalities of gait and mobility: Secondary | ICD-10-CM | POA: Insufficient documentation

## 2022-06-19 DIAGNOSIS — R278 Other lack of coordination: Secondary | ICD-10-CM | POA: Diagnosis not present

## 2022-06-19 DIAGNOSIS — N393 Stress incontinence (female) (male): Secondary | ICD-10-CM | POA: Diagnosis not present

## 2022-06-19 DIAGNOSIS — N812 Incomplete uterovaginal prolapse: Secondary | ICD-10-CM

## 2022-06-19 DIAGNOSIS — N816 Rectocele: Secondary | ICD-10-CM

## 2022-06-19 DIAGNOSIS — N8110 Cystocele, unspecified: Secondary | ICD-10-CM

## 2022-06-19 DIAGNOSIS — R2681 Unsteadiness on feet: Secondary | ICD-10-CM | POA: Diagnosis not present

## 2022-06-19 NOTE — Therapy (Signed)
OUTPATIENT PHYSICAL THERAPY NEURO EVALUATION   Patient Name: Erica Daugherty MRN: [id number] DOB:Apr 30, 1960, 62 y.o., female Today's Date: 06/19/2022   PCP: Rachell Cipro, MD REFERRING PROVIDER: Suzzanne Cloud, NP   END OF SESSION:  PT End of Session - 06/19/22 1403     Visit Number 1    Number of Visits 9    Date for PT Re-Evaluation 07/31/22    Authorization Type BCBS    PT Start Time 1403    PT Stop Time [id number]    PT Time Calculation (min) 42 min    Equipment Utilized During Treatment Gait belt    Activity Tolerance Patient tolerated treatment well    Behavior During Therapy WFL for tasks assessed/performed             Past Medical History:  Diagnosis Date   Cholelithiasis with chronic cholecystitis without biliary obstruction 12/12/2017   Gallstones    History of decreased platelet count    Hyperlipidemia    Hypertension    Incarcerated umbilical hernia [id number]   Neuropathy    Umbilical hernia    Past Surgical History:  Procedure Laterality Date   CHOLECYSTECTOMY N/A 12/12/2017   Procedure: LAPAROSCOPIC CHOLECYSTECTOMY WITH INTRAOPERATIVE CHOLANGIOGRAM ERAS PATHWAY;  Surgeon: Fanny Skates, MD;  Location: [place];  Service: General;  Laterality: N/A;   EYE SURGERY  04/22/1962   lazy eye   no colonoscopy     03/26/13  & 04/28/14 [place] reviewed   SHOULDER SURGERY  04/22/2010   Left ; Dr Onnie Graham   UMBILICAL HERNIA REPAIR N/A 12/12/2017   No mesh. Procedure: REPAIR UMBILICAL HERNIA;  Surgeon: Fanny Skates, MD;  Location: [place];  Service: General;  Laterality: N/A;   Patient Active Problem List   Diagnosis Date Noted   Restless leg syndrome 01/18/2020   Gait abnormality 08/30/2019   Mixed axonal-demyelinating polyneuropathy 08/30/2019   Neuropathic pain of foot 08/05/2019   Cholelithiasis with chronic cholecystitis without biliary obstruction 12/12/2017   Incarcerated umbilical hernia [id number]   Postmenopausal HRT (hormone replacement therapy) 08/11/2015    Peripheral neuropathy 10/06/2013   Hyperlipidemia 03/24/2012   THORACIC/LUMBOSACRAL NEURITIS/RADICULITIS UNSPEC 06/02/2008    ONSET DATE: 06/05/2022 referral  REFERRING DIAG: G62.89 (ICD-10-CM) - Mixed axonal-demyelinating polyneuropathy   THERAPY DIAG:  Other abnormalities of gait and mobility  Other lack of coordination  Unsteadiness on feet  Rationale for Evaluation and Treatment: Rehabilitation  SUBJECTIVE:  SUBJECTIVE STATEMENT: Patient ambulates into clinic with no AD. Reports poor balance and that neurologist recommends "help with her gait." Reports minimal protective sensation remaining in distal B LE. Neuropathy dx ~10 years ago.  Pt accompanied by: self  PERTINENT HISTORY: axonal demyelinating polyneuropathy  PAIN:  Are you having pain? No  PRECAUTIONS: Fall  WEIGHT BEARING RESTRICTIONS: No  FALLS: Has patient fallen in last 6 months? Yes. 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 ankle on cable and fell (didn't hit head, able to get off the floor herself)  LIVING ENVIRONMENT: Lives with: lives with their spouse Lives in: House/apartment Stairs: Yes: Internal: flight steps; on right going up and External: 4 steps; on right going up Has following equipment at home: None  PLOF: Independent; Scientist, forensic; floor transfers, on her feet a lot   PATIENT GOALS: "to feel more confident with my balance"  OBJECTIVE:   COGNITION: Overall cognitive status: Within functional limits for tasks assessed   SENSATION: Light touch: Impaired  Stereognosis: Impaired  Hot/Cold: Impaired  Proprioception: Impaired   COORDINATION: WFL heel shin/figure 8/ RAM  EDEMA:  Dependent edema in B LE symmetrically  POSTURE: No  Significant postural limitations  LOWER EXTREMITY MMT:    MMT Right Eval Left Eval  Hip flexion 4 4  Hip extension    Hip abduction 4 4  Hip adduction 4 4  Hip internal rotation    Hip external rotation    Knee flexion 4 4+  Knee extension 4 4  Ankle dorsiflexion 4 4  Ankle plantarflexion 4 4  Ankle inversion    Ankle eversion    (Blank rows = not tested)  BED MOBILITY:  Reports no difficulty   TRANSFERS: Assistive device utilized: None  Sit to stand: Complete Independence Stand to sit: Complete Independence Chair to chair: Complete Independence   STAIRS: Level of Assistance: Modified independence Stair Negotiation Technique: Step to Pattern with Bilateral Rails Number of Stairs: 4  Height of Stairs: 6    GAIT: Gait pattern: step through pattern, decreased arm swing- Right, decreased arm swing- Left, and wide BOS Distance walked: clinic Assistive device utilized: None Level of assistance: Modified independence  FUNCTIONAL TESTS:   [facility] PT Assessment - 06/19/22 0001       Standardized Balance Assessment   Standardized Balance Assessment 10 meter walk test    10 Meter Walk .27ms      Functional Gait  Assessment   Gait assessed  Yes    Gait Level Surface Walks 20 ft in less than 7 sec but greater than 5.5 sec, uses assistive device, slower speed, mild gait deviations, or deviates 6-10 in outside of the 12 in walkway width.    Change in Gait Speed Able to change speed, demonstrates mild gait deviations, deviates 6-10 in outside of the 12 in walkway width, or no gait deviations, unable to achieve a major change in velocity, or uses a change in velocity, or uses an assistive device.    Gait with Horizontal Head Turns Performs head turns smoothly with slight change in gait velocity (eg, minor disruption to smooth gait path), deviates 6-10 in outside 12 in walkway width, or uses an assistive device.    Gait with Vertical Head Turns Performs task with moderate change  in gait velocity, slows down, deviates 10-15 in outside 12 in walkway width but recovers, can continue to walk.    Gait and Pivot Turn Pivot turns safely in  greater than 3 sec and stops with no loss of balance, or pivot turns safely within 3 sec and stops with mild imbalance, requires small steps to catch balance.    Step Over Obstacle Is able to step over one shoe box (4.5 in total height) but must slow down and adjust steps to clear box safely. May require verbal cueing.    Gait with Narrow Base of Support Ambulates less than 4 steps heel to toe or cannot perform without assistance.    Gait with Eyes Closed Cannot walk 20 ft without assistance, severe gait deviations or imbalance, deviates greater than 15 in outside 12 in walkway width or will not attempt task.    Ambulating Backwards Walks 20 ft, uses assistive device, slower speed, mild gait deviations, deviates 6-10 in outside 12 in walkway width.    Steps Two feet to a stair, must use rail.    Total Score 13            M-CTSIB:  Condition 1: 30s no sway Condition 2: 30smoderate sway Condition 3: 30s moderate sway  Condition 4: 8.4s   PATIENT SURVEYS:  ABC scale 60%  TODAY'S TREATMENT:                                                                                                                              N/a eval   PATIENT EDUCATION: Education details: PT POC, exam findings, skin checks Person educated: Patient Education method: Explanation Education comprehension: verbalized understanding  HOME EXERCISE PROGRAM: To be provided  GOALS: Goals reviewed with patient? Yes  SHORT TERM GOALS: = LTG based on POC length  LONG TERM GOALS: Target date: 08/02/22  Pt will be independent with final HEP for improved balance  Baseline: to be provided Goal status: INITIAL  2.  Pt will improve FGA to >/= 18/30 to demonstrate improved balance and reduced fall risk  Baseline: 13/30 Goal status: INITIAL  3.  Patient will score  >/=75% on the ABC to demonstrate improved confidence with mobility Baseline: 60% Goal status: INITIAL  4.  Patient will complete at least 30s on condition 4 of the M-CTSIB to demonstrate improved balance Baseline: 8.4s Goal status: INITIAL    ASSESSMENT:  CLINICAL IMPRESSION: Patient is a 61 y.o. female who was seen today for physical therapy evaluation and treatment for impaired balance. Patient demonstrates increased fall risk as noted by score of 13/30 on  Functional Gait Assessment.   <22/30 = predictive of falls, <20/30 = fall in 6 months, <18/30 = predictive of falls in PD MCID: 5 points stroke population, 4 points geriatric population (ANPTA Core Set of Outcome Measures for Adults with Neurologic Conditions, 2018). Patient scoring a 60% on the ABC scale demonstrating impaired confidence with mobility and fear of falling with everyday tasks. Patient able to complete 8.4s on condition 4 with the M-CTSIB indicative of increased risk of falling. She would benefit from skilled PT services to address the  above mentioned deficits.     OBJECTIVE IMPAIRMENTS: Abnormal gait, decreased balance, and difficulty walking.   ACTIVITY LIMITATIONS: carrying, lifting, squatting, stairs, bathing, locomotion level, and caring for others  PARTICIPATION LIMITATIONS: meal prep, cleaning, interpersonal relationship, driving, shopping, community activity, occupation, and yard work  PERSONAL FACTORS: Age and Time since onset of injury/illness/exacerbation are also affecting patient's functional outcome.   REHAB POTENTIAL: Good  CLINICAL DECISION MAKING: Stable/uncomplicated  EVALUATION COMPLEXITY: Low  PLAN:  PT FREQUENCY: 2x/week  PT DURATION: 4 weeks  PLANNED INTERVENTIONS: Therapeutic exercises, Therapeutic activity, Neuromuscular re-education, Balance training, Gait training, Patient/Family education, Self Care, Joint mobilization, Stair training, Vestibular training, Canalith repositioning,  Visual/preceptual remediation/compensation, Orthotic/Fit training, DME instructions, Aquatic Therapy, Electrical stimulation, Taping, Manual therapy, and Re-evaluation  PLAN FOR NEXT SESSION: HEP, balance on compliant surfaces, EC, practice carrying things (has to carry grandchildren), floor transfers   Debbora Dus, PT Debbora Dus, PT, DPT, CBIS  06/19/2022, 2:47 PM

## 2022-06-19 NOTE — Progress Notes (Signed)
Erica Daugherty Return Visit  SUBJECTIVE  History of Present Illness: Erica Daugherty is a 62 y.o. female seen in follow-up for prolapse. She underwent urodynamic testing and MRI of the pelvis for concern of a suburethral cyst.   Urodynamic Impression:  1. Sensation was normal; capacity was increased 2. Stress Incontinence was demonstrated at normal pressures; 3. Detrusor Overactivity was demonstrated without leakage. 4. Emptying was dysfunctional with a normal PVR, a minimal non-sustained detrusor contraction present,  abdominal straining present, normal urethral sphincter activity on EMG.  MRI pelvis 05/04/22- IMPRESSION: No evidence of urethral diverticulum or suburethral cyst.  Past Medical History: Patient  has a past medical history of Cholelithiasis with chronic cholecystitis without biliary obstruction (12/12/2017), Gallstones, History of decreased platelet count, Hyperlipidemia, Hypertension, Incarcerated umbilical hernia ([id number]), Neuropathy, and Umbilical hernia.   Past Surgical History: She  has a past surgical history that includes Shoulder surgery (04/22/2010); Eye surgery (04/22/1962); no colonoscopy; Cholecystectomy (N/A, [id number]); and Umbilical hernia repair (N/A, 12/12/2017).   Medications: She has a current medication list which includes the following prescription(s): carvedilol, chlorthalidone, vitamin d, cyanocobalamin, duloxetine, gabapentin, metanx, phentermine, ropinirole, rosuvastatin, and valsartan.   Allergies: Patient has No Known Allergies.   Social History: Patient  reports that she has never smoked. She has never used smokeless tobacco. She reports current alcohol use. She reports that she does not use drugs.      OBJECTIVE     Physical Exam: Vitals:   06/19/22 1123  BP: 133/80  Pulse: 78   Gen: No apparent distress, A&O x 3.  Detailed Urogynecologic Evaluation:  Deferred. Prior exam showed:  POP-Q   0                                             Aa   0                                           Ba   -6                                              C    5.5                                            Gh   5                                            Pb   10                                            tvl    -0.5  Ap   -0.5                                            Bp   9.5                                              D        ASSESSMENT AND PLAN    Erica Daugherty is a 62 y.o. with:  1. Prolapse of anterior vaginal wall   2. Prolapse of posterior vaginal wall   3. Uterovaginal prolapse, incomplete   4. SUI (stress urinary incontinence, female)    - Plan for surgery: TBD - She is considering between Sacrospinous hysteropexy, A&P repair, perineorrhaphy vs Robotic TLH with sacrocolpopexy and perineorrhaphy. She also wants to have a sling for her incontinence.  - She is leaning more toward the hysteropexy procedure but will let us know what she decides. Handouts provided for her to review.   - We reviewed the patient's specific anatomic and functional findings, with the assistance of diagrams, and together finalized the above procedure. The planned surgical procedures were discussed along with the surgical risks outlined below, which were also provided on a detailed handout. Additional treatment options including expectant management, conservative management, medical management were discussed where appropriate.  We reviewed the benefits and risks of each treatment option.   General Surgical Risks: For all procedures, there are risks of bleeding, infection, damage to surrounding organs including but not limited to bowel, bladder, blood vessels, ureters and nerves, and need for further surgery if an injury were to occur. These risks are all low with minimally invasive surgery.   There are risks of numbness and weakness at any body site or buttock/rectal pain.  It is  possible that baseline pain can be worsened by surgery, either with or without mesh. If surgery is vaginal, there is also a low risk of possible conversion to laparoscopy or open abdominal incision where indicated. Very rare risks include blood transfusion, blood clot, heart attack, pneumonia, or death.   There is also a risk of short-term postoperative urinary retention with need to use a catheter. About half of patients need to go home from surgery with a catheter, which is then later removed in the office. The risk of long-term need for a catheter is very low. There is also a risk of worsening of overactive bladder.   Sling: The effectiveness of a midurethral vaginal mesh sling is approximately 85%, and thus, there will be times when you may leak urine after surgery, especially if your bladder is full or if you have a strong cough. There is a balance between making the sling tight enough to treat your leakage but not too tight so that you have long-term difficulty emptying your bladder. A mesh sling will not directly treat overactive bladder/urge incontinence and may worsen it.  There is an FDA safety notification on vaginal mesh procedures for prolapse but NOT mesh slings. We have extensive experience and training with mesh placement and we have close postoperative follow up to identify any potential complications from mesh. It is important to realize that this mesh is a permanent implant that cannot be easily removed. There are rare risks of mesh exposure (2-4%), pain with  intercourse (0-7%), and infection (<1%). The risk of mesh exposure if more likely in a woman with risks for poor healing (prior radiation, poorly controlled diabetes, or immunocompromised). The risk of new or worsened chronic pain after mesh implant is more common in women with baseline chronic pain and/or poorly controlled anxiety or depression. Approximately 2-4% of patients will experience longer-term post-operative voiding  dysfunction that may require surgical revision of the sling. We also reviewed that postoperatively, her stream may not be as strong as before surgery.   Prolapse (with or without mesh): Risk factors for surgical failure  include things that put pressure on your pelvis and the surgical repair, including obesity, chronic cough, and heavy lifting or straining (including lifting children or adults, straining on the toilet, or lifting heavy objects such as furniture or anything weighing >25 lbs. Risks of recurrence is 20-30% with vaginal native tissue repair and a less than 10% with sacrocolpopexy with mesh.    Sacrocolpopexy: Mesh implants may provide more prolapse support, but do have some unique risks to consider. It is important to understand that mesh is permanent and cannot be easily removed. Risks of abdominal sacrocolpopexy mesh include mesh exposure (~3-6%), painful intercourse (recent studies show lower rates after surgery compared to before, with ~5-8% risk of new onset), and very rare risks of bowel or bladder injury or infection (<1%). The risk of mesh exposure is more likely in a woman with risks for poor healing (prior radiation, poorly controlled diabetes, or immunocompromised). The risk of new or worsened chronic pain after mesh implant is more common in women with baseline chronic pain and/or poorly controlled anxiety or depression. There is an FDA safety notification on vaginal mesh procedures for prolapse but NOT abdominal mesh procedures and therefore does not apply to your surgery. We have extensive experience and training with mesh placement and we have close postoperative follow up to identify any potential complications from mesh.    - For preop Visit:  She is required to have a visit within 30 days of her surgery.   - Medical clearance: not required  - Anticoagulant use: No - Medicaid Hysterectomy form: No - Accepts blood transfusion: Yes - Expected length of stay:  outpatient  She will notify us when she decides on surgery route and will have the surgery scheduler contact her.   Jaquita Folds, MD

## 2022-06-25 ENCOUNTER — Telehealth: Payer: Self-pay | Admitting: Genetic Counselor

## 2022-06-25 ENCOUNTER — Encounter: Payer: Self-pay | Admitting: Genetic Counselor

## 2022-06-25 ENCOUNTER — Ambulatory Visit: Payer: BC Managed Care – PPO | Attending: Neurology | Admitting: Physical Therapy

## 2022-06-25 ENCOUNTER — Encounter: Payer: Self-pay | Admitting: Physical Therapy

## 2022-06-25 VITALS — BP 119/88 | HR 72

## 2022-06-25 DIAGNOSIS — R2681 Unsteadiness on feet: Secondary | ICD-10-CM | POA: Diagnosis not present

## 2022-06-25 DIAGNOSIS — Z1509 Genetic susceptibility to other malignant neoplasm: Secondary | ICD-10-CM

## 2022-06-25 DIAGNOSIS — Z1501 Genetic susceptibility to malignant neoplasm of breast: Secondary | ICD-10-CM | POA: Insufficient documentation

## 2022-06-25 DIAGNOSIS — I10 Essential (primary) hypertension: Secondary | ICD-10-CM | POA: Diagnosis not present

## 2022-06-25 DIAGNOSIS — R2689 Other abnormalities of gait and mobility: Secondary | ICD-10-CM | POA: Diagnosis not present

## 2022-06-25 DIAGNOSIS — Z1379 Encounter for other screening for genetic and chromosomal anomalies: Secondary | ICD-10-CM | POA: Insufficient documentation

## 2022-06-25 DIAGNOSIS — R600 Localized edema: Secondary | ICD-10-CM | POA: Diagnosis not present

## 2022-06-25 DIAGNOSIS — G2581 Restless legs syndrome: Secondary | ICD-10-CM | POA: Diagnosis not present

## 2022-06-25 DIAGNOSIS — G629 Polyneuropathy, unspecified: Secondary | ICD-10-CM | POA: Diagnosis not present

## 2022-06-25 DIAGNOSIS — R278 Other lack of coordination: Secondary | ICD-10-CM | POA: Diagnosis not present

## 2022-06-25 HISTORY — DX: Genetic susceptibility to malignant neoplasm of breast: Z15.01

## 2022-06-25 HISTORY — DX: Genetic susceptibility to other malignant neoplasm: Z15.09

## 2022-06-25 NOTE — Telephone Encounter (Signed)
Contacted patient in attempt to disclose results of genetic testing.  LVM with contact information requesting a call back.

## 2022-06-25 NOTE — Therapy (Signed)
OUTPATIENT PHYSICAL THERAPY NEURO TREATMENT   Patient Name: LAURETTE PASSARELLA MRN: [id number] DOB:[birth year], 62 y.o., female Today's Date: 06/25/2022   PCP: Rachell Cipro, MD REFERRING PROVIDER: Suzzanne Cloud, NP   END OF SESSION:  PT End of Session - 06/25/22 0935     Visit Number 2    Number of Visits 9    Date for PT Re-Evaluation 07/31/22    Authorization Type BCBS    PT Start Time [phone]    PT Stop Time 1014    PT Time Calculation (min) 40 min    Equipment Utilized During Treatment Gait belt    Activity Tolerance Patient tolerated treatment well    Behavior During Therapy [place] for tasks assessed/performed             Past Medical History:  Diagnosis Date   Cholelithiasis with chronic cholecystitis without biliary obstruction 12/12/2017   Gallstones    History of decreased platelet count    Hyperlipidemia    Hypertension    Incarcerated umbilical hernia 99991111   Neuropathy    Umbilical hernia    Past Surgical History:  Procedure Laterality Date   CHOLECYSTECTOMY N/A 12/12/2017   Procedure: LAPAROSCOPIC CHOLECYSTECTOMY WITH INTRAOPERATIVE CHOLANGIOGRAM ERAS PATHWAY;  Surgeon: Fanny Skates, MD;  Location: [facility];  Service: General;  Laterality: N/A;   EYE SURGERY  04/22/1962   lazy eye   no colonoscopy     03/26/13  & 04/28/14 SOC reviewed   SHOULDER SURGERY  04/22/2010   Left ; Dr Onnie Graham   UMBILICAL HERNIA REPAIR N/A 12/12/2017   No mesh. Procedure: REPAIR UMBILICAL HERNIA;  Surgeon: Fanny Skates, MD;  Location: [facility];  Service: General;  Laterality: N/A;   Patient Active Problem List   Diagnosis Date Noted   Restless leg syndrome 01/18/2020   Gait abnormality 08/30/2019   Mixed axonal-demyelinating polyneuropathy 08/30/2019   Neuropathic pain of foot 08/05/2019   Cholelithiasis with chronic cholecystitis without biliary obstruction 12/12/2017   Incarcerated umbilical hernia 99991111   Postmenopausal HRT (hormone replacement therapy) 08/11/2015    Peripheral neuropathy 10/06/2013   Hyperlipidemia 03/24/2012   THORACIC/LUMBOSACRAL NEURITIS/RADICULITIS UNSPEC 06/02/2008    ONSET DATE: 06/05/2022 referral  REFERRING DIAG: G62.89 (ICD-10-CM) - Mixed axonal-demyelinating polyneuropathy   THERAPY DIAG:  Other abnormalities of gait and mobility  Other lack of coordination  Unsteadiness on feet  Rationale for Evaluation and Treatment: Rehabilitation  SUBJECTIVE:  SUBJECTIVE STATEMENT: Patient reports that she is doing well. She wants to work on her balance. She did put in a grip mat in her shower and says it is alright; hasn't noticed a huge change. Denies any falls/near falls since last session. Reports towards end of session having a hard time getting clothes on in standing because it is difficult to stand on her right side.  Pt accompanied by: self  PERTINENT HISTORY: axonal demyelinating polyneuropathy  PAIN:  Are you having pain? No  PRECAUTIONS: Fall  WEIGHT BEARING RESTRICTIONS: No  FALLS: Has patient fallen in last 6 months? Yes. 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 ankle on cable and fell (didn't hit head, able to get off the floor herself)  PATIENT GOALS: "to feel more confident with my balance"  OBJECTIVE:   COGNITION: Overall cognitive status: Within functional limits for tasks assessed   TODAY'S TREATMENT:                                                                                                                               NMR: Romberg Stance on Foam Pad with Head Rotation  - 3 sets - 10 reps (trialed initial set on pillow and then switched to foam pad to increase challenge) Romberg Stance with Head Nods on Foam Pad - 3 sets - 10 reps  (trialed initial set on  pillow and then switched to foam pad to increase challenge) Romberg Stance Eyes Closed on Foam Pad  - 3 sets - 30" hold Single leg stance with Eyes open on firm - 2 sets - 10" hold - RLE  TherAct: Fall recovery - completes with SBA with UE support and uses LLE forward in tall kneel to come to stand (approriate use of form) Fall recovery without UE support - with CGA - able to complete with thigh assist on LLE but has poor power through this leg and has to quickly hop right leg forward to get it beneath her to come to stand, lateral instability noted but able to come to stand Education on home safety techniques such as crawling to surface for support and not quickly rushing to get up immediately after a fall   PATIENT EDUCATION: Education details: Initial HEP - (foam versus pillow) Person educated: Patient Education method: Explanation Education comprehension: verbalized understanding  HOME EXERCISE PROGRAM: Access Code: [email or web address] URL: [email or web address] Date: 06/25/2022 Prepared by: Malachi Carl  Exercises - Romberg Stance on Foam Pad with Head Rotation  - 1 x daily - 7 x weekly - 3 sets - 10 reps - Romberg Stance with Head Nods on Foam Pad  - 1 x daily - 7 x weekly - 3 sets - 10 reps - Romberg Stance Eyes Closed on Foam Pad  - 1 x daily - 7 x weekly - 3 sets - 30 hold - Single Leg Stance  - 1 x daily - 7 x  weekly - 3 sets - 10 hold  GOALS: Goals reviewed with patient? Yes  SHORT TERM GOALS: = LTG based on POC length  LONG TERM GOALS: Target date: 08/02/22  Pt will be independent with final HEP for improved balance  Baseline: to be provided Goal status: INITIAL  2.  Pt will improve FGA to >/= 18/30 to demonstrate improved balance and reduced fall risk  Baseline: 13/30 Goal status: INITIAL  3.  Patient will score >/=75% on the ABC to demonstrate improved confidence with mobility Baseline: 60% Goal status: INITIAL  4.  Patient will complete at least 30s on  condition 4 of the M-CTSIB to demonstrate improved balance Baseline: 8.4s Goal status: INITIAL  ASSESSMENT:  CLINICAL IMPRESSION: Session emphasized creation of initial HEP with balance on compliant surface tasks in addition to fall recovery. Patient tolerated well and was able to complete EC balance on foam for full 30" duration during today's session. Also introduced SLS activity and progress as able to increase ease on donning clothes in standing as able. Patient is safe with fall recovery with UE support but will benefit from progressing LE power and technique to improve safety when UE support unavailable. She would benefit from skilled PT services to address the above mentioned deficits.    OBJECTIVE IMPAIRMENTS: Abnormal gait, decreased balance, and difficulty walking.   ACTIVITY LIMITATIONS: carrying, lifting, squatting, stairs, bathing, locomotion level, and caring for others  PARTICIPATION LIMITATIONS: meal prep, cleaning, interpersonal relationship, driving, shopping, community activity, occupation, and yard work  PERSONAL FACTORS: Age and Time since onset of injury/illness/exacerbation are also affecting patient's functional outcome.   REHAB POTENTIAL: Good  CLINICAL DECISION MAKING: Stable/uncomplicated  EVALUATION COMPLEXITY: Low  PLAN:  PT FREQUENCY: 2x/week  PT DURATION: 4 weeks  PLANNED INTERVENTIONS: Therapeutic exercises, Therapeutic activity, Neuromuscular re-education, Balance training, Gait training, Patient/Family education, Self Care, Joint mobilization, Stair training, Vestibular training, Canalith repositioning, Visual/preceptual remediation/compensation, Orthotic/Fit training, DME instructions, Aquatic Therapy, Electrical stimulation, Taping, Manual therapy, and Re-evaluation  PLAN FOR NEXT SESSION: review initial HEP, balance on compliant surfaces, EC, practice carrying things (has to carry grandchildren), floor transfers without UE support progression (half  kneel to stand activities), SLS progression with vestibular challenges  Esperanza Heir, PT, DPT 06/25/2022, 12:05 PM

## 2022-06-26 ENCOUNTER — Ambulatory Visit (INDEPENDENT_AMBULATORY_CARE_PROVIDER_SITE_OTHER): Payer: BC Managed Care – PPO | Admitting: Neurology

## 2022-06-26 ENCOUNTER — Encounter: Payer: Self-pay | Admitting: Neurology

## 2022-06-26 VITALS — BP 139/89 | HR 68 | Ht 66.0 in | Wt 251.0 lb

## 2022-06-26 DIAGNOSIS — G2581 Restless legs syndrome: Secondary | ICD-10-CM | POA: Diagnosis not present

## 2022-06-26 DIAGNOSIS — Z9189 Other specified personal risk factors, not elsewhere classified: Secondary | ICD-10-CM | POA: Diagnosis not present

## 2022-06-26 DIAGNOSIS — R0683 Snoring: Secondary | ICD-10-CM | POA: Insufficient documentation

## 2022-06-26 DIAGNOSIS — G4719 Other hypersomnia: Secondary | ICD-10-CM | POA: Diagnosis not present

## 2022-06-26 NOTE — Progress Notes (Signed)
SLEEP MEDICINE CLINIC    Provider:  Larey Seat, MD  Primary Care Physician:  Fanny Bien, MD [address] [place] [address]     Referring Provider: Suzzanne Cloud, [address] [place],  [place] [address]     Primary Neurologist is Dr Krista Blue        Chief Complaint according to patient   Patient presents with:     New Patient (Initial Visit)     reports she has been having issues with daytime fatigue and drowsiness for close to 10 years. She feels like she sleeps well at night, no prior SS. Also mentions she can snore so loud she wakes herself up. Gabapentin tid may have contributed to drowsiness, she changes to HS intake.       HISTORY OF PRESENT ILLNESS:  Erica Daugherty is a 62 y.o. female patient who is seen upon referral on 06/26/2022 from dr. Krista Blue / Butler Denmark NP for a sleep consultation.  Chief concern according to patient :  " Mrs. Whitehill reports that about 10 years ago she began noticing more fatigue and drowsiness.  In the treatment of mixed axonal and demyelinating polyneuropathy she was also exposed to gabapentin initially 3 times daily.  This made her extra sleepy and drowsy and she changed to a bedtime intake time only.  However the improvement is minimal.  Sitting here waiting for me she reports that she struggles not to fall asleep.  Whenever she is not physically active or stimulated she can drift easily off to sleep.  He is also aware that she snores for at least 10 years.  She does not report having being sleepier than her peers at any time in her use or early adulthood.  She considers herself a night person and not a morning person. She is excessively daytime sleepy with an EPWORTH SS of 20/ 24 points (!)> There have been no prior sleep studies.  I have the pleasure of seeing Erica Daugherty 0000000 a right-handed female with a possible sleep disorder.    Sleep relevant medical history: No Nocturia, no sleep walking, no ENT surgery, infrequent  sinusitis.no ,cervical spine surgery/no TBI.   Family medical /sleep history: father likely has OSA, loud snorer,  cardiac valve disease, MI, CAD,  CHF.  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  lives in a household with spouse . Married with 4 adult children, 5 grandchildren.  The patient currently works full time, no pets, Tobacco use: none .  ETOH use : 1/month,  Caffeine intake in form of Coffee( /) Soda( 1/ day ) Tea ( /) or energy drinks Exercise in form of none.        Sleep habits are as follows: The patient's dinner time is between 6-7 PM. The patient goes to bed at 10-11 PM and continues to sleep for 7-8 hours, wakes rarely for  bathroom breaks.   The preferred sleep position is supine - prone , with the support of 1 pillow, flat bed.  Dreams are reportedly  frequent/vivid, that sometimes wake her .   The patient wakes up with an alarm- repeatedly. Rises 30-60 minutes later- 7.30  AM is the usual rise time. She reports not feeling refreshed or restored in AM, with symptoms such as dry mouth, morning headaches, and residual fatigue. She is very sleepy when she edits her photographies on a computer, still and alone, or  in dark.  Naps are taken frequently, unscheduled , lasting from 10 to 15 minutes and are no more refreshing .Power nap of  30 minutes is refreshing.    Review of Systems: Out of a complete 14 system review, the patient complains of only the following symptoms, and all other reviewed systems are negative.:  Fatigue, sleepiness , snoring, fragmented sleep, Insomnia, RLS, Neuropathy    How likely are you to doze in the following situations: 0 = not likely, 1 = slight chance, 2 = moderate chance, 3 = high chance   Sitting and Reading? Watching Television? Sitting inactive in a public place (theater or meeting)? As a passenger in a car for an hour without a break? Lying down in the afternoon when circumstances permit? Sitting and  talking to someone? Sitting quietly after lunch without alcohol? In a car, while stopped for a few minutes in traffic?   Total = 20/ 24 points   FSS endorsed at 58/ 63 points.  GDS 2/ 15   Social History   Socioeconomic History   Marital status: Married    Spouse name: [name]   Number of children: 4   Years of education: college   Highest education level: Not on file  Occupational History   Occupation: Pharmacist, hospital: SELF EMPLOYED  Tobacco Use   Smoking status: Never   Smokeless tobacco: Never  Vaping Use   Vaping Use: Never used  Substance and Sexual Activity   Alcohol use: Yes    Comment: occ   Drug use: No   Sexual activity: Yes    Partners: Male    Birth control/protection: Post-menopausal, Surgical    Comment: vasectomy  Other Topics Concern   Not on file  Social History Narrative   Patient lives at home with her husband [name]). Patient works full time.   Education college   Right handed.   Caffeine sometimes one cup of sweet tea.   Social Determinants of Health   Financial Resource Strain: Not on file  Food Insecurity: Not on file  Transportation Needs: Not on file  Physical Activity: Not on file  Stress: Not on file  Social Connections: Not on file    Family History  Problem Relation Age of Onset   Diabetes Mother    Hyperlipidemia Mother    Hypertension Mother    Thyroid nodules Mother    Prostate cancer Father 44   Testicular cancer Brother 54   Diabetes Brother    Uterine cancer Paternal Grandmother        dx unknown age   Stroke Neg Hx    Heart disease Neg Hx    Colon cancer Neg Hx    Stomach cancer Neg Hx     Past Medical History:  Diagnosis Date   Cholelithiasis with chronic cholecystitis without biliary obstruction 12/12/2017   Gallstones    History of decreased platelet count    Hyperlipidemia    Hypertension    Incarcerated umbilical hernia [id number]   Monoallelic mutation of ATM gene 06/25/2022    Neuropathy    Umbilical hernia     Past Surgical History:  Procedure Laterality Date   CHOLECYSTECTOMY N/A 12/12/2017   Procedure: LAPAROSCOPIC CHOLECYSTECTOMY WITH INTRAOPERATIVE CHOLANGIOGRAM ERAS PATHWAY;  Surgeon: Fanny Skates, MD;  Location: [facility];  Service: General;  Laterality: N/A;   EYE SURGERY  04/22/1962   lazy eye   no colonoscopy     03/26/13  & 04/28/14 SOC reviewed   SHOULDER SURGERY  04/22/2010   Left ; Dr Onnie Graham   UMBILICAL HERNIA REPAIR N/A 12/12/2017   No mesh. Procedure: REPAIR UMBILICAL HERNIA;  Surgeon: Fanny Skates, MD;  Location: [facility];  Service: General;  Laterality: N/A;     Current Outpatient Medications on File Prior to Visit  Medication Sig Dispense Refill   carvedilol (COREG) 12.5 MG tablet TAKE 1 tablet orally 2 times per day with food     chlorthalidone (HYGROTON) 25 MG tablet Take 12.5 mg by mouth daily.     Cholecalciferol (VITAMIN D PO) Take 5,000 Int'l Units/day by mouth.     Cyanocobalamin 2500 MCG TABS Take 2,500 mcg by mouth daily.      DULoxetine (CYMBALTA) 60 MG capsule Take 1 capsule (60 mg total) by mouth daily. 90 capsule 4   gabapentin (NEURONTIN) 300 MG capsule TAKE FOUR CAPSULES BY MOUTH AT BEDTIME 360 capsule 4   L-Methylfolate-Algae-B12-B6 (METANX) 3-90.314-2-35 MG CAPS TAKE 1 CAPSULE BY MOUTH TWO TIMES A DAY 180 capsule 2   phentermine 15 MG capsule Take 15 mg by mouth every morning.     rOPINIRole (REQUIP) 1 MG tablet Take 2 tablets (2 mg total) by mouth at bedtime. 180 tablet 3   rosuvastatin (CRESTOR) 5 MG tablet Take 5 mg by mouth daily.     valsartan (DIOVAN) 320 MG tablet take1 tablet orally daily for High blood pressure     No current facility-administered medications on file prior to visit.    No Known Allergies   DIAGNOSTIC DATA (LABS, IMAGING, TESTING) - I reviewed patient records, labs, notes, testing and imaging myself where available.  Lab Results  Component Value Date   WBC 7.3 08/05/2019   HGB 13.0  08/05/2019   HCT 38.5 08/05/2019   MCV 89 08/05/2019   PLT 237 08/05/2019      Component Value Date/Time   NA 141 08/05/2019 1230   K 3.5 08/05/2019 1230   CL 99 08/05/2019 1230   CO2 29 08/05/2019 1230   GLUCOSE 101 (H) 08/05/2019 1230   GLUCOSE 81 12/03/2017 0924   BUN 18 08/05/2019 1230   CREATININE 1.07 (H) 08/05/2019 1230   CREATININE 1.02 10/09/2015 1527   CALCIUM 9.8 08/05/2019 1230   PROT 6.7 08/05/2019 1230   ALBUMIN 3.9 08/05/2019 1230   AST 18 08/05/2019 1230   ALT 17 08/05/2019 1230   ALKPHOS 57 08/05/2019 1230   BILITOT 0.3 08/05/2019 1230   GFRNONAA 57 (L) 08/05/2019 1230   GFRAA 66 08/05/2019 1230   Lab Results  Component Value Date   CHOL 162 01/23/2016   HDL 32 (A) 01/23/2016   LDLCALC 119 01/23/2016   LDLDIRECT 157.2 03/26/2013   TRIG 53 01/23/2016   CHOLHDL 3.8 10/09/2015   Lab Results  Component Value Date   HGBA1C 5.7 (H) 08/05/2019   Lab Results  Component Value Date   VITAMINB12 >2000 (H) 08/05/2019   Lab Results  Component Value Date   TSH 1.840 08/05/2019    PHYSICAL EXAM:  Today's Vitals   06/26/22 1106  BP: 139/89  Pulse: 68  Weight: 251 lb (113.9 kg)  Height: '5\' 6"'$  (1.676 m)   Body mass index is 40.51 kg/m.   Wt Readings from Last 3 Encounters:  06/26/22 251 lb (113.9 kg)  06/05/22 253 lb (114.8 kg)  04/12/22 253 lb (114.8 kg)     Ht Readings from Last 3 Encounters:  06/26/22 '5\' 6"'$  (1.676 m)  06/05/22 '5\' 6"'$  (1.676 m)  04/12/22 5' 6.5" (1.689 m)  General: The patient is awake, alert and appears not in acute distress. The patient is well groomed. Head: Normocephalic, atraumatic. Neck is supple.  Mallampati 2,  neck circumference:16 inches . Nasal airflow  patent.  Retrognathia is not seen.  Dental status: biological  Cardiovascular:  Regular rate and cardiac rhythm by pulse,  without distended neck veins. Respiratory: Lungs are clear to auscultation.  Skin:  With ankle edema, Trunk: BMI 40    NEUROLOGIC  EXAM: The patient is awake and alert, oriented to place and time.   Memory subjective described as intact.  Attention span & concentration ability appears normal.  Speech is fluent,  without dysarthria, dysphonia or aphasia.  Mood and affect are appropriate.   Cranial nerves: no loss of smell or taste reported  Pupils are equal and briskly reactive to light. Funduscopic exam deferred. .  Extraocular movements in vertical and horizontal planes were intact and without nystagmus. No Diplopia. Visual fields by finger perimetry are intact. Hearing was intact to soft voice and finger rubbing.    Facial sensation intact to fine touch.  Facial motor strength is symmetric and tongue and uvula move midline.  Neck ROM : rotation, tilt and flexion extension were normal for age and shoulder shrug was symmetrical.    Motor exam:  Symmetric bulk, tone and ROM.   Normal tone without cog wheeling, symmetric grip strength .   Sensory:  Fine touch and vibration were absent at the knee and below.  Proprioception tested in the upper extremities was normal.   Coordination: Rapid alternating movements in the fingers/hands were of normal speed.  The Finger-to-nose maneuver was intact without evidence of ataxia, dysmetria or tremor.   Gait and station: Patient could rise unassisted from a seated position, walked without assistive device. She has balance problems,  Stance is of normal width/ base and the patient turned with 4 steps. Tandem gait is unsteady .  Toe and heel walk were deferred.  Deep tendon reflexes: in the  upper and lower extremities are absent (!)  Babinski response was deferred.    ASSESSMENT AND PLAN 62 y.o. year old female  here with:    1) Increased Sleepiness and fatigue, now reaching excessive levels. Epworth SS was 20/ 24 points. Onset may have been a decade ago but exacerbation along Polyneuropathy  ( 10 years ago) and more recently much sleepier on gabapentin  2) High risk of  OSA due to observed snoring ,waking up  gasping, paternal history of OSA, BMI of 40 and airway.   3) Polyneuropathy of unknown origin .  Cause for RLS .  I would definitely like for Mrs. Takach to undergo a sleep study rather urgently.   Since it would not be possible for [place] to see her quickly in the sleep lab for an attended sleep study : I would first have her undergo a home sleep test which is screening for obstructive sleep apnea and can also give data about hypoxia at night.   This type of sleep study will not allow conclusions about restless leg or periodic limb movements at night ( and she has endorsed RLS ) about nightmare disorder etc. since I am very suspicious that she has apnea I want this condition first to be treated and then see if her sleepiness responds to the treatment of this condition.  If she would remain excessively sleepy in spite of being treated for apnea I would definitely want her to return at that time for an in lab  sleep study followed by an MSLT multiple sleep latency test.  The patient does report that she dreams frequently she has a very high degree of sleepiness and fatigue endorsed, there has been no cataplexy or sleep paralysis reported.  Follow up either personally or through our NP [place] within 4-5 months.   I would like to thank Fanny Bien, MD and Suzzanne Cloud, [address] [place],  [place] [address] for allowing me to meet with and to take care of this pleasant patient.   CC: I will share my notes with PCP.  After spending a total time of  45  minutes face to face and additional time for physical and neurologic examination, review of laboratory studies,  personal review of imaging studies, reports and results of other testing and review of referral information / records as far as provided in visit,   Electronically signed by: Larey Seat, MD 06/26/2022 11:34 AM  [facility] Board certified by The  [employer] of Sleep Medicine and Diplomate of the [employer] of Sleep Medicine. Board certified In Neurology through the [place], Fellow of the [employer] of Neurology. Medical Director of [employer].

## 2022-06-26 NOTE — Addendum Note (Signed)
Addended by: Larey Seat on: 06/26/2022 12:10 PM   Modules accepted: Orders

## 2022-06-26 NOTE — Patient Instructions (Signed)
Quality Sleep Information, Adult Quality sleep is important for your mental and physical health. It also improves your quality of life. Quality sleep means you: Are asleep for most of the time you are in bed. Fall asleep within 30 minutes. Wake up no more than once a night. Are awake for no longer than 20 minutes if you do wake up during the night. Most adults need 7-8 hours of quality sleep each night. How can poor sleep affect me? If you do not get enough quality sleep, you may have: Mood swings. Daytime sleepiness. Decreased alertness, reaction time, and concentration. Sleep disorders, such as insomnia and sleep apnea. Difficulty with: Solving problems. Coping with stress. Paying attention. These issues may affect your performance and productivity at work, school, and home. Lack of sleep may also put you at higher risk for accidents, suicide, and risky behaviors. If you do not get quality sleep, you may also be at higher risk for several health problems, including: Infections. Type 2 diabetes. Heart disease. High blood pressure. Obesity. Worsening of long-term conditions, like arthritis, kidney disease, depression, Parkinson's disease, and epilepsy. What actions can I take to get more quality sleep? Sleep schedule and routine Stick to a sleep schedule. Go to sleep and wake up at about the same time each day. Do not try to sleep less on weekdays and make up for lost sleep on weekends. This does not work. Limit naps during the day to 30 minutes or less. Do not take naps in the late afternoon. Make time to relax before bed. Reading, listening to music, or taking a hot bath promotes quality sleep. Make your bedroom a place that promotes quality sleep. Keep your bedroom dark, quiet, and at a comfortable room temperature. Make sure your bed is comfortable. Avoid using electronic devices that give off bright blue light for 30 minutes before bedtime. Your brain perceives bright blue light  as sunlight. This includes television, phones, and computers. If you are lying awake in bed for longer than 20 minutes, get up and do a relaxing activity until you feel sleepy. Lifestyle     Try to get at least 30 minutes of exercise on most days. Do not exercise 2-3 hours before going to bed. Do not use any products that contain nicotine or tobacco. These products include cigarettes, chewing tobacco, and vaping devices, such as e-cigarettes. If you need help quitting, ask your health care provider. Do not drink caffeinated beverages for at least 8 hours before going to bed. Coffee, tea, and some sodas contain caffeine. Do not drink alcohol or eat large meals close to bedtime. Try to get at least 30 minutes of sunlight every day. Morning sunlight is best. Medical concerns Work with your health care provider to treat medical conditions that may affect sleeping, such as: Nasal obstruction. Snoring. Sleep apnea and other sleep disorders. Talk to your health care provider if you think any of your prescription medicines may cause you to have difficulty falling or staying asleep. If you have sleep problems, talk with a sleep consultant. If you think you have a sleep disorder, talk with your health care provider about getting evaluated by a specialist. Where to find more information Sleep Foundation: sleepfoundation.org American Academy of Sleep Medicine: aasm.org Centers for Disease Control and Prevention (CDC): StoreMirror.com.cy Contact a health care provider if: You have trouble getting to sleep or staying asleep. You often wake up very early in the morning and cannot get back to sleep. You have daytime sleepiness. You  have daytime sleep attacks of suddenly falling asleep and sudden muscle weakness (narcolepsy). You have a tingling sensation in your legs with a strong urge to move your legs (restless legs syndrome). You stop breathing briefly during sleep (sleep apnea). You think you have a sleep  disorder or are taking a medicine that is affecting your quality of sleep. Summary Most adults need 7-8 hours of quality sleep each night. Getting enough quality sleep is important for your mental and physical health. Make your bedroom a place that promotes quality sleep, and avoid things that may cause you to have poor sleep, such as alcohol, caffeine, smoking, or large meals. Talk to your health care provider if you have trouble falling asleep or staying asleep. This information is not intended to replace advice given to you by your health care provider. Make sure you discuss any questions you have with your health care provider. Document Revised: 08/01/2021 Document Reviewed: 08/01/2021 Elsevier Patient Education  [place] for Sleep Apnea  Sleep apnea is a condition in which breathing pauses or becomes shallow during sleep. Sleep apnea screening is a test to determine if you are at risk for sleep apnea. The test includes a series of questions. It will only takes a few minutes. Your health care provider may ask you to have this test in preparation for surgery or as part of a physical exam. What are the symptoms of sleep apnea? Common symptoms of sleep apnea include: Snoring. Waking up often at night. Daytime sleepiness. Pauses in breathing. Choking or gasping during sleep. Irritability. Forgetfulness. Trouble thinking clearly. Depression. Personality changes. Most people with sleep apnea do not know that they have it. What are the advantages of sleep apnea screening? Getting screened for sleep apnea can help: Ensure your safety. It is important for your health care providers to know whether or not you have sleep apnea, especially if you are having surgery or have other long-term (chronic) health conditions. Improve your health and allow you to get a better night's rest. Restful sleep can help you: Have more energy. Lose weight. Improve high blood  pressure. Improve diabetes management. Prevent stroke. Prevent car accidents. What happens during the screening? Screening usually includes being asked a list of questions about your sleep quality. Some questions you may be asked include: Do you snore? Is your sleep restless? Do you have daytime sleepiness? Has a partner or spouse told you that you stop breathing during sleep? Have you had trouble concentrating or memory loss? What is your age? What is your neck circumference? To measure your neck, keep your back straight and gently wrap the tape measure around your neck. Put the tape measure at the middle of your neck, between your chin and collarbone. What is your sex assigned at birth? Do you have or are you being treated for high blood pressure? If your screening test is positive, you are at risk for the condition. Further testing may be needed to confirm a diagnosis of sleep apnea. Where to find more information You can find screening tools online or at your health care clinic. For more information about sleep apnea screening and healthy sleep, visit these websites: Centers for Disease Control and Prevention: http://www.wolf.info/ American Sleep Apnea Association: www.sleepapnea.org Contact a health care provider if: You think that you may have sleep apnea. Summary Sleep apnea screening can help determine if you are at risk for sleep apnea. It is important for your health care providers to know whether or not you have  sleep apnea, especially if you are having surgery or have other chronic health conditions. You may be asked to take a screening test for sleep apnea in preparation for surgery or as part of a physical exam. This information is not intended to replace advice given to you by your health care provider. Make sure you discuss any questions you have with your health care provider. Document Revised: 03/17/2020 Document Reviewed: 03/17/2020 Elsevier Patient Education  [place].

## 2022-06-27 ENCOUNTER — Telehealth: Payer: Self-pay | Admitting: Genetic Counselor

## 2022-06-27 NOTE — Telephone Encounter (Signed)
Revealed ATM gene mutation.  Briefly discussed breast, ovarian, panc cancer risks and family implications.  Scheduled follow up genetics appt for 3/13 at 11am.

## 2022-06-28 ENCOUNTER — Ambulatory Visit: Payer: BC Managed Care – PPO

## 2022-07-02 ENCOUNTER — Encounter: Payer: Self-pay | Admitting: Physical Therapy

## 2022-07-02 ENCOUNTER — Ambulatory Visit: Payer: BC Managed Care – PPO | Admitting: Physical Therapy

## 2022-07-02 VITALS — BP 107/82 | HR 78

## 2022-07-02 DIAGNOSIS — R2681 Unsteadiness on feet: Secondary | ICD-10-CM | POA: Diagnosis not present

## 2022-07-02 DIAGNOSIS — R278 Other lack of coordination: Secondary | ICD-10-CM

## 2022-07-02 DIAGNOSIS — R2689 Other abnormalities of gait and mobility: Secondary | ICD-10-CM | POA: Diagnosis not present

## 2022-07-02 NOTE — Therapy (Signed)
OUTPATIENT PHYSICAL THERAPY NEURO TREATMENT   Patient Name: Erica Daugherty MRN: [id number] DOB:07-08-1960, 62 y.o., female Today's Date: 07/02/2022   PCP: Rachell Cipro, MD REFERRING PROVIDER: Suzzanne Cloud, NP   END OF SESSION:  PT End of Session - 07/02/22 0934     Visit Number 3    Number of Visits 9    Date for PT Re-Evaluation 07/31/22    Authorization Type BCBS    PT Start Time 0933    PT Stop Time 1015    PT Time Calculation (min) 42 min    Equipment Utilized During Treatment Gait belt    Activity Tolerance Patient tolerated treatment well    Behavior During Therapy [facility] for tasks assessed/performed             Past Medical History:  Diagnosis Date   Cholelithiasis with chronic cholecystitis without biliary obstruction 12/12/2017   Gallstones    History of decreased platelet count    Hyperlipidemia    Hypertension    Incarcerated umbilical hernia [id number]   Monoallelic mutation of ATM gene 06/25/2022   Neuropathy    Umbilical hernia    Past Surgical History:  Procedure Laterality Date   CHOLECYSTECTOMY N/A 12/12/2017   Procedure: LAPAROSCOPIC CHOLECYSTECTOMY WITH INTRAOPERATIVE CHOLANGIOGRAM ERAS PATHWAY;  Surgeon: Fanny Skates, MD;  Location: [place];  Service: General;  Laterality: N/A;   EYE SURGERY  04/22/1962   lazy eye   no colonoscopy     03/26/13  & 04/28/14 [facility] reviewed   SHOULDER SURGERY  04/22/2010   Left ; Dr Onnie Graham   UMBILICAL HERNIA REPAIR N/A 12/12/2017   No mesh. Procedure: REPAIR UMBILICAL HERNIA;  Surgeon: Fanny Skates, MD;  Location: [place];  Service: General;  Laterality: N/A;   Patient Active Problem List   Diagnosis Date Noted   RLS (restless legs syndrome) 06/26/2022   Excessive daytime sleepiness 06/26/2022   Risk factors for obstructive sleep apnea 06/26/2022   Loud snoring 06/26/2022   Morbid obesity ([place]) [id number]   Monoallelic mutation of ATM gene 06/25/2022   Genetic testing 06/25/2022   Restless leg syndrome  01/18/2020   Gait abnormality 08/30/2019   Mixed axonal-demyelinating polyneuropathy 08/30/2019   Neuropathic pain of foot 08/05/2019   Cholelithiasis with chronic cholecystitis without biliary obstruction 12/12/2017   Incarcerated umbilical hernia [id number]   Postmenopausal HRT (hormone replacement therapy) 08/11/2015   Peripheral neuropathy 10/06/2013   Hyperlipidemia 03/24/2012   THORACIC/LUMBOSACRAL NEURITIS/RADICULITIS UNSPEC 06/02/2008    ONSET DATE: 06/05/2022 referral  REFERRING DIAG: G62.89 (ICD-10-CM) - Mixed axonal-demyelinating polyneuropathy   THERAPY DIAG:  Other abnormalities of gait and mobility  Other lack of coordination  Unsteadiness on feet  Rationale for Evaluation and Treatment: Rehabilitation  SUBJECTIVE:  SUBJECTIVE STATEMENT: Patient reports that she is doing good; has been busy but has been able to practice a few of her exercises. She decided to get an airex pad instead of using pillows. She feels safe performing at home.   Pt accompanied by: self  PERTINENT HISTORY: axonal demyelinating polyneuropathy  PAIN:  Are you having pain? No  PRECAUTIONS: Fall  WEIGHT BEARING RESTRICTIONS: No  FALLS: Has patient fallen in last 6 months? Yes. 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 ankle on cable and fell (didn't hit head, able to get off the floor herself)  PATIENT GOALS: "to feel more confident with my balance"  OBJECTIVE:   COGNITION: Overall cognitive status: Within functional limits for tasks assessed   TODAY'S TREATMENT:                                                                                                                               TherAct: Fall recovery practice progression with decreasing UE  support Lunge standing in place with 14" inch surface for back leg to tap down onto (box + double layered foam pad) 1 x 10 with trekking poll for UE support bil, 1 x 10 no UE support  (CGA) Lunge step forward and step back with 14" inch surface for back leg to tap down onto (box + double layered foam pad) 1 x 10 UE trekking poll for UE support --> progressed to 11.5" surface (box + single layered foam pad) with trekking poll for UE support (CGA) On mat table with bench for UE support as needed to get on/off mat Tall kneel to half knee with single UE support on bench, required assist of R hand to fully get right leg into position 1 x 10 bil, progressed to use of PVC poll for UE support for increased balance challenge 1 x 10 bil Dead lift with tidal tank standing on firm surface to simulate picking up grandchild (CGA)   Dead lift with tidal tank standing on foam to simulate picking up grandchild (CGA)  PATIENT EDUCATION: Education details: Continue HEP Person educated: Patient Education method: Explanation Education comprehension: verbalized understanding  HOME EXERCISE PROGRAM: Access Code: [email or web address] URL: [email or web address] Date: 06/25/2022 Prepared by: Malachi Carl  Exercises - Romberg Stance on Foam Pad with Head Rotation  - 1 x daily - 7 x weekly - 3 sets - 10 reps - Romberg Stance with Head Nods on Foam Pad  - 1 x daily - 7 x weekly - 3 sets - 10 reps - Romberg Stance Eyes Closed on Foam Pad  - 1 x daily - 7 x weekly - 3 sets - 30 hold - Single Leg Stance  - 1 x daily - 7 x weekly - 3 sets - 10 hold  GOALS: Goals reviewed with patient? Yes  SHORT TERM GOALS: = LTG based on POC length  LONG TERM GOALS: Target date: 08/02/22  Pt will be independent with final HEP for improved balance  Baseline: to be provided Goal status: INITIAL  2.  Pt will improve FGA to >/= 18/30 to demonstrate improved balance and reduced fall risk  Baseline: 13/30 Goal status:  INITIAL  3.  Patient will score >/=75% on the ABC to demonstrate improved confidence with mobility Baseline: 60% Goal status: INITIAL  4.  Patient will complete at least 30s on condition 4 of the M-CTSIB to demonstrate improved balance Baseline: 8.4s Goal status: INITIAL  ASSESSMENT:  CLINICAL IMPRESSION: Session emphasized functional tasks to improve safety with fall recovery when UE support is not present in addition to lifting mechanisms to increase safety with picking up grand kids. Patient tolerated both well. Demonstrates reduced force production and strength in lunge position and half kneel R > L lower extremity, requiring increased compensation. She would benefit from skilled PT services to address the above mentioned deficits.    OBJECTIVE IMPAIRMENTS: Abnormal gait, decreased balance, and difficulty walking.   ACTIVITY LIMITATIONS: carrying, lifting, squatting, stairs, bathing, locomotion level, and caring for others  PARTICIPATION LIMITATIONS: meal prep, cleaning, interpersonal relationship, driving, shopping, community activity, occupation, and yard work  PERSONAL FACTORS: Age and Time since onset of injury/illness/exacerbation are also affecting patient's functional outcome.   REHAB POTENTIAL: Good  CLINICAL DECISION MAKING: Stable/uncomplicated  EVALUATION COMPLEXITY: Low  PLAN:  PT FREQUENCY: 2x/week  PT DURATION: 4 weeks  PLANNED INTERVENTIONS: Therapeutic exercises, Therapeutic activity, Neuromuscular re-education, Balance training, Gait training, Patient/Family education, Self Care, Joint mobilization, Stair training, Vestibular training, Canalith repositioning, Visual/preceptual remediation/compensation, Orthotic/Fit training, DME instructions, Aquatic Therapy, Electrical stimulation, Taping, Manual therapy, and Re-evaluation  PLAN FOR NEXT SESSION: review initial HEP, balance on compliant surfaces, EC, practice carrying things (has to carry grandchildren),  floor transfers without UE support progression (half kneel to stand activities), SLS progression with vestibular challenges + progress HEP  Esperanza Heir, PT, DPT 07/02/2022, 1:41 PM

## 2022-07-03 ENCOUNTER — Other Ambulatory Visit: Payer: Self-pay

## 2022-07-03 ENCOUNTER — Encounter: Payer: Self-pay | Admitting: Genetic Counselor

## 2022-07-03 ENCOUNTER — Inpatient Hospital Stay: Payer: BC Managed Care – PPO | Admitting: Genetic Counselor

## 2022-07-03 ENCOUNTER — Inpatient Hospital Stay: Payer: BC Managed Care – PPO | Attending: Genetic Counselor | Admitting: Genetic Counselor

## 2022-07-03 DIAGNOSIS — D219 Benign neoplasm of connective and other soft tissue, unspecified: Secondary | ICD-10-CM

## 2022-07-03 DIAGNOSIS — Z1509 Genetic susceptibility to other malignant neoplasm: Secondary | ICD-10-CM | POA: Diagnosis not present

## 2022-07-03 DIAGNOSIS — Z1501 Genetic susceptibility to malignant neoplasm of breast: Secondary | ICD-10-CM

## 2022-07-03 DIAGNOSIS — Z1589 Genetic susceptibility to other disease: Secondary | ICD-10-CM | POA: Diagnosis not present

## 2022-07-03 NOTE — Progress Notes (Signed)
GENETIC TEST RESULTS   Patient Name: Erica Daugherty Patient Age: 62 y.o. Encounter Date: 07/03/2022  Referring Provider: Danella Sensing, MD   Erica Daugherty was seen in the [facility] clinic on June 10, 2022 due to a personal history of angioleiomyomas and concern regarding a hereditary predisposition to cancer in the family.   Erica Daugherty is a 62 y.o. female with no personal history of cancer.  In December 2016, at the age of 59, skin biopsy of right mid central posterior tibia showed angioleiomyoma.  In December 2023, at the age of 53, shave biopsy of right mid central posterior tibia also showed angioleiomyoma.  She denies a personal history of cancer.    RISK FACTORS:  Mammogram within the last year: yes Number of breast biopsies: 0. Colonoscopy: yes; 2016. Hysterectomy: no; denies history of uterine fibroids  Ovaries intact: yes.  Menarche was at age 69.  First live birth at age 76.  OCP use for approximately 4 years.  HRT use: 2 years; stopped more than 5 years ago   FAMILY HISTORY:  We obtained a detailed, 4-generation family history.  Significant diagnoses are listed below:      Family History  Problem Relation Age of Onset   Prostate cancer Father 50   Testicular cancer Brother 30   Uterine cancer Paternal Grandmother          dx unknown age       Erica Daugherty's mother was adopted and does not have information about her biological relatives. Erica Daugherty is unaware of previous family history of genetic testing for hereditary cancer risks. There is no reported Ashkenazi Jewish ancestry. There is no known consanguinity.  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 Specifically, this variant is  [id number]  No other pathogenic variants were detected in [facility] [facility]RNA[facility]ight Panel.  The CancerNext-Expanded gene panel offered by [facility] and includes sequencing, rearrangement, and  RNA analysis for the following 71 genes:  AIP, ALK, APC, ATM, BAP1, BARD1, BMPR1A, BRCA1, BRCA2, BRIP1, CDC73, CDH1, CDK4, CDKN1B, CDKN2A, CHEK2, DICER1, FH, FLCN, KIF1B, LZTR1,MAX, MEN1, MET, MLH1, MSH2, MSH6, MUTYH, NF1, NF2, NTHL1, PALB2, PHOX2B, PMS2, POT1, PRKAR1A, PTCH1, PTEN, RAD51C,RAD51D, RB1, RET, SDHA, SDHAF2, SDHB, SDHC, SDHD, SMAD4, SMARCA4, SMARCB1, SMARCE1, STK11, SUFU, TMEM127, TP53,TSC1, TSC2 and VHL (sequencing and deletion/duplication); AXIN2, CTNNA1, EGFR, EGLN1, HOXB13, KIT, MITF, MSH3, PDGFRA, POLD1 and POLE (sequencing only); EPCAM and GREM1 (deletion/duplication only).   Of note, given her history of angioleiomyomas, her dermatologist wished to rule out mutations in the [place] gene (related to Hereditary Leiomyomatosis and Renal Cell Carinoma syndrome).  No deleterious variants were detected in the FH gene.     The test report has been scanned into EPIC and is located under the Molecular Pathology section of the Results Review tab.  A portion of the result report is included below for reference. Genetic testing reported out on June 21, 2022.   Genetic testing did identify a variant of uncertain significance (VUS) in the SDHB gene called  p.R38C (c.112C>T).  At this time, it is unknown if this variant is associated with increased cancer risk or if this is a normal finding, but most variants such as this get reclassified to being inconsequential. It should not be used to make medical management decisions. With time, we suspect the lab will determine the significance of this variant, if any. If we do learn more about it, we will try to  contact Erica Daugherty to discuss it further. However, it is important to stay in touch with [place] periodically and keep the address and phone number up to date.    Cancer Risks for ATM Females have a 20-30% lifetime risk of breast cancer. 2-3% risk for epithelial ovarian cancer 5-10% risk for pancreatic cancer  There is emerging evidence suggesting an  increased risk for prostate cancer.  Research is continuing to help learn more about the cancers associated with ATM pathogenic variants and what the exact risks are to develop these cancers.  Management Recommendations:  Breast Screening/Risk Reduction: Breast cancer screening includes: Breast awareness beginning at age 90 Monthly self-breast examination beginning at age 15 Clinical breast examination every 6-12 months beginning at age 17 or at the age of the earliest diagnosed breast cancer in the family, if onset was before age 27 Annual mammogram with consideration of tomosynthesis starting at age 110 or 66 years prior to the youngest age of diagnosis, whichever comes first Consider breast MRI with and without contrast starting at age 57-35 Evidence is insufficient for a prophylactic risk-reducing mastectomy, manage based on family history  For patients who are treated for breast cancer and have not had bilateral mastectomy, screening should continue as described  Ovarian Cancer Screening/Risk Reduction: Evidence insufficient for risk-reducing salpingo oophorectomy; manage based on family history Seek prompt evaluation with onset of signs/symptoms related to ovarian cancer If there is a family history of ovarian cancer, have a discussion with your physician about the benefits and limitations of screening and risk reducing strategies  Pancreatic Cancer Screening/Risk Reduction: Avoid smoking, heavy alcohol use, and obesity. Pancreatic cancer screening may be considered in those with a family history of pancreatic cancer (first- or second-degree relative). Screening includes annual endoscopic ultrasound (preferred) and/or MRI of the pancreas starting at age 61 or 64 years younger than the earliest age diagnosis in the family.   Prostate Cancer Screening: Consider beginning annual PSA blood test and digital rectal exams at age 48.  Additional considerations: There is insufficient  evidence to recommend against radiation therapy.  Individuals with a single pathogenic ATM variant are also carriers of ataxia telangiectasia. Ataxia telangiectasia is associated with childhood cancer risks as well as other medical problems (such as difficulty with movement, balance and coordination problems, neuropathy, and weakened immunity). For there to be a risk of ataxia telangiectasia in offspring, both the patient and their partner would each have to carry a pathogenic variant in ATM; in this case, the risk to have an affected child is 25%.  This information is based on current understanding of the gene and may change in the future.  Implications for Family Members: Hereditary predisposition to cancer due to pathogenic variants in the ATM gene has autosomal dominant inheritance. This means that an individual with a pathogenic variant has a 50% chance of passing the condition on to his/her offspring. Identification of a pathogenic variant allows for the recognition of at-risk relatives who can pursue testing for the familial variant.   Family members are encouraged to consider genetic testing for this familial pathogenic variant. As there are generally no childhood cancer risks associated with single pathogenic variants in the ATM gene, individuals in the family are not recommended to have testing until they reach at least 63 years of age. They may contact our office at [phone] for more information or to schedule an appointment. Complimentary testing for the familial variant is available for 90 days after the report date. Family members who live  outside of the area are encouraged to find a genetic counselor in their area by visiting: [email or web address].  Resources: FORCE (Facing Our Risk of Cancer Empowered) is a resource for those with a hereditary predisposition to develop cancer.  FORCE provides information about risk reduction, advocacy, legislation, and clinical  trials.  Additionally, FORCE provides a platform for collaboration and support; which includes: peer navigation, message boards, local support groups, a toll-free helpline, research registry and recruitment, advocate training, published medical research, webinars, brochures, mastectomy photos, and more.  For more information, visit www.facingourrisk.org   Plan:  Referral to high risk breast clinic to discuss appropriate breast imaging, including considering breast MRIs.   No changes to GYN cancer screening indicated based on this result alone. She knows to seek prompt evaluation in the setting of signs/symptoms of ovarian cancer.  She is currently following up with urogynecologist Dr. Sherlene Shams due to prolapse and considering hysterectomy.  Dr. Wannetta Sender notified of this result.  There is no known family history of pancreatic cancer; however, she does not have information about maternal relatives.  We offered a referral to [facility]/Dr. Gerrit Heck to discuss benefits/limitations of pancreatic cancer screening.  She agreed to referral. She was previously seen by Dr. Havery Moros at [place].  Family letter provided to encourage testing of family members.  She knows that her adult children, siblings, and other more distant relatives should be offered genetic testing.    We encouraged Ms. Lamour to remain in contact with [place] on an annual basis so we can update her personal and family histories, and let her know of advances in cancer genetics that may benefit the family. Our contact number was provided. Ms. Speno questions were answered to her satisfaction today, and she knows she is welcome to call anytime with additional questions.   Niesha Bame M. Joette Catching, [place], [facility] Genetic Counselor [email or web address] (P) [phone]  The patient was seen for a total of 40 minutes in face-to-face genetic counseling.

## 2022-07-04 ENCOUNTER — Telehealth: Payer: Self-pay | Admitting: Hematology and Oncology

## 2022-07-04 NOTE — Telephone Encounter (Signed)
scheduled per 3/12 referral, pt has been called and confirmed date and time. Pt is aware of location and to arrive early for check in

## 2022-07-05 ENCOUNTER — Ambulatory Visit: Payer: BC Managed Care – PPO

## 2022-07-05 DIAGNOSIS — R278 Other lack of coordination: Secondary | ICD-10-CM | POA: Diagnosis not present

## 2022-07-05 DIAGNOSIS — R2689 Other abnormalities of gait and mobility: Secondary | ICD-10-CM | POA: Diagnosis not present

## 2022-07-05 DIAGNOSIS — R2681 Unsteadiness on feet: Secondary | ICD-10-CM

## 2022-07-05 NOTE — Therapy (Signed)
OUTPATIENT PHYSICAL THERAPY NEURO TREATMENT   Patient Name: Erica Daugherty MRN: [id number] DOB:21-Mar-1961, 62 y.o., female Today's Date: 07/05/2022   PCP: Rachell Cipro, MD REFERRING PROVIDER: Suzzanne Cloud, NP   END OF SESSION:  PT End of Session - 07/05/22 0933     Visit Number 4    Number of Visits 9    Date for PT Re-Evaluation 07/31/22    Authorization Type BCBS    PT Start Time 0930    PT Stop Time 1013    PT Time Calculation (min) 43 min    Equipment Utilized During Treatment Gait belt    Activity Tolerance Patient tolerated treatment well    Behavior During Therapy WFL for tasks assessed/performed             Past Medical History:  Diagnosis Date   Cholelithiasis with chronic cholecystitis without biliary obstruction 12/12/2017   Gallstones    History of decreased platelet count    Hyperlipidemia    Hypertension    Incarcerated umbilical hernia [id number]   Monoallelic mutation of ATM gene 06/25/2022   Neuropathy    Umbilical hernia    Past Surgical History:  Procedure Laterality Date   CHOLECYSTECTOMY N/A 12/12/2017   Procedure: LAPAROSCOPIC CHOLECYSTECTOMY WITH INTRAOPERATIVE CHOLANGIOGRAM ERAS PATHWAY;  Surgeon: Fanny Skates, MD;  Location: [facility];  Service: General;  Laterality: N/A;   EYE SURGERY  04/22/1962   lazy eye   no colonoscopy     03/26/13  & 04/28/14 [place] reviewed   SHOULDER SURGERY  04/22/2010   Left ; Dr Onnie Graham   UMBILICAL HERNIA REPAIR N/A 12/12/2017   No mesh. Procedure: REPAIR UMBILICAL HERNIA;  Surgeon: Fanny Skates, MD;  Location: [facility];  Service: General;  Laterality: N/A;   Patient Active Problem List   Diagnosis Date Noted   RLS (restless legs syndrome) 06/26/2022   Excessive daytime sleepiness 06/26/2022   Risk factors for obstructive sleep apnea 06/26/2022   Loud snoring 06/26/2022   Morbid obesity ([place]) [id number]   Monoallelic mutation of ATM gene 06/25/2022   Genetic testing 06/25/2022   Restless leg syndrome  01/18/2020   Gait abnormality 08/30/2019   Mixed axonal-demyelinating polyneuropathy 08/30/2019   Neuropathic pain of foot 08/05/2019   Cholelithiasis with chronic cholecystitis without biliary obstruction 12/12/2017   Incarcerated umbilical hernia [id number]   Postmenopausal HRT (hormone replacement therapy) 08/11/2015   Peripheral neuropathy 10/06/2013   Hyperlipidemia 03/24/2012   THORACIC/LUMBOSACRAL NEURITIS/RADICULITIS UNSPEC 06/02/2008    ONSET DATE: 06/05/2022 referral  REFERRING DIAG: G62.89 (ICD-10-CM) - Mixed axonal-demyelinating polyneuropathy   THERAPY DIAG:  Other abnormalities of gait and mobility  Other lack of coordination  Unsteadiness on feet  Rationale for Evaluation and Treatment: Rehabilitation  SUBJECTIVE:  SUBJECTIVE STATEMENT: Patient reports that she is doing good; has been busy but has been able to practice a few of her exercises. She decided to get an airex pad instead of using pillows. She feels safe performing at home.   Pt accompanied by: self  PERTINENT HISTORY: axonal demyelinating polyneuropathy  PAIN:  Are you having pain? No  PRECAUTIONS: Fall  PATIENT GOALS: "to feel more confident with my balance"    TODAY'S TREATMENT:                                                                                                                               TherAct: In // bars:  Lateral stepping on foam balance beam  Tandem gait on foam balance beam Rockerboard a/p static, smooth a/p weight shifts, EC head turns Rockerboard a/p SLS anterior/posterior step over rockerboard -seated ankle AROM coordination on wobble board -half kneeling on floor mat 5# kettle bell UE D2  -SLS on foot mat using contralateral LE to manipulate slam ball in semi-circle -scifit  hills level 4 x5 mins B LE only  PATIENT EDUCATION: Education details: Continue HEP Person educated: Patient Education method: Explanation Education comprehension: verbalized understanding  HOME EXERCISE PROGRAM: Access Code: [email or web address] URL: [email or web address] Date: 06/25/2022 Prepared by: Malachi Carl  Exercises - Romberg Stance on Foam Pad with Head Rotation  - 1 x daily - 7 x weekly - 3 sets - 10 reps - Romberg Stance with Head Nods on Foam Pad  - 1 x daily - 7 x weekly - 3 sets - 10 reps - Romberg Stance Eyes Closed on Foam Pad  - 1 x daily - 7 x weekly - 3 sets - 30 hold - Single Leg Stance  - 1 x daily - 7 x weekly - 3 sets - 10 hold  GOALS: Goals reviewed with patient? Yes  SHORT TERM GOALS: = LTG based on POC length  LONG TERM GOALS: Target date: 08/02/22  Pt will be independent with final HEP for improved balance  Baseline: to be provided Goal status: INITIAL  2.  Pt will improve FGA to >/= 18/30 to demonstrate improved balance and reduced fall risk  Baseline: 13/30 Goal status: INITIAL  3.  Patient will score >/=75% on the ABC to demonstrate improved confidence with mobility Baseline: 60% Goal status: INITIAL  4.  Patient will complete at least 30s on condition 4 of the M-CTSIB to demonstrate improved balance Baseline: 8.4s Goal status: INITIAL  ASSESSMENT:  CLINICAL IMPRESSION: Patient seen for skilled PT session with emphasis on balance retraining. Patient demonstrating improved ankle strategy, even with EC and on compliant surfaces. Remains with better balance on L LE>R LE. Continue POC.    OBJECTIVE IMPAIRMENTS: Abnormal gait, decreased balance, and difficulty walking.   ACTIVITY LIMITATIONS: carrying, lifting, squatting, stairs, bathing, locomotion level, and caring for others  PARTICIPATION LIMITATIONS: meal prep, cleaning, interpersonal relationship, driving, shopping, community activity, occupation, and yard work  PERSONAL  FACTORS: Age and Time since onset of injury/illness/exacerbation are also affecting patient's functional outcome.   REHAB POTENTIAL: Good  CLINICAL DECISION MAKING: Stable/uncomplicated  EVALUATION COMPLEXITY: Low  PLAN:  PT FREQUENCY: 2x/week  PT DURATION: 4 weeks  PLANNED INTERVENTIONS: Therapeutic exercises, Therapeutic activity, Neuromuscular re-education, Balance training, Gait training, Patient/Family education, Self Care, Joint mobilization, Stair training, Vestibular training, Canalith repositioning, Visual/preceptual remediation/compensation, Orthotic/Fit training, DME instructions, Aquatic Therapy, Electrical stimulation, Taping, Manual therapy, and Re-evaluation  PLAN FOR NEXT SESSION: review initial HEP, balance on compliant surfaces, EC, practice carrying things (has to carry grandchildren), floor transfers without UE support progression (half kneel to stand activities), SLS progression with vestibular challenges + progress HEP  Debbora Dus, PT, DPT, CBIS 07/05/2022, 10:13 AM

## 2022-07-09 ENCOUNTER — Ambulatory Visit: Payer: BC Managed Care – PPO

## 2022-07-09 DIAGNOSIS — Z1159 Encounter for screening for other viral diseases: Secondary | ICD-10-CM | POA: Diagnosis not present

## 2022-07-09 DIAGNOSIS — R278 Other lack of coordination: Secondary | ICD-10-CM

## 2022-07-09 DIAGNOSIS — R2689 Other abnormalities of gait and mobility: Secondary | ICD-10-CM

## 2022-07-09 DIAGNOSIS — R2681 Unsteadiness on feet: Secondary | ICD-10-CM

## 2022-07-09 DIAGNOSIS — J069 Acute upper respiratory infection, unspecified: Secondary | ICD-10-CM | POA: Diagnosis not present

## 2022-07-09 NOTE — Therapy (Signed)
OUTPATIENT PHYSICAL THERAPY NEURO TREATMENT   Patient Name: Erica Daugherty MRN: [id number] DOB:05-27-1960, 62 y.o., female Today's Date: 07/09/2022   PCP: Rachell Cipro, MD REFERRING PROVIDER: Suzzanne Cloud, NP   END OF SESSION:  PT End of Session - 07/09/22 0852     Visit Number 5    Number of Visits 9    Date for PT Re-Evaluation 07/31/22    Authorization Type BCBS    PT Start Time [phone]   patient late   PT Stop Time 0930    PT Time Calculation (min) 39 min    Equipment Utilized During Treatment Gait belt    Activity Tolerance Patient tolerated treatment well    Behavior During Therapy [facility] for tasks assessed/performed             Past Medical History:  Diagnosis Date   Cholelithiasis with chronic cholecystitis without biliary obstruction 12/12/2017   Gallstones    History of decreased platelet count    Hyperlipidemia    Hypertension    Incarcerated umbilical hernia [id number]   Monoallelic mutation of ATM gene 06/25/2022   Neuropathy    Umbilical hernia    Past Surgical History:  Procedure Laterality Date   CHOLECYSTECTOMY N/A 12/12/2017   Procedure: LAPAROSCOPIC CHOLECYSTECTOMY WITH INTRAOPERATIVE CHOLANGIOGRAM ERAS PATHWAY;  Surgeon: Fanny Skates, MD;  Location: [place];  Service: General;  Laterality: N/A;   EYE SURGERY  04/22/1962   lazy eye   no colonoscopy     03/26/13  & 04/28/14 [place] reviewed   SHOULDER SURGERY  04/22/2010   Left ; Dr Onnie Graham   UMBILICAL HERNIA REPAIR N/A 12/12/2017   No mesh. Procedure: REPAIR UMBILICAL HERNIA;  Surgeon: Fanny Skates, MD;  Location: [place];  Service: General;  Laterality: N/A;   Patient Active Problem List   Diagnosis Date Noted   RLS (restless legs syndrome) 06/26/2022   Excessive daytime sleepiness 06/26/2022   Risk factors for obstructive sleep apnea 06/26/2022   Loud snoring 06/26/2022   Morbid obesity ([place]) [id number]   Monoallelic mutation of ATM gene 06/25/2022   Genetic testing 06/25/2022   Restless  leg syndrome 01/18/2020   Gait abnormality 08/30/2019   Mixed axonal-demyelinating polyneuropathy 08/30/2019   Neuropathic pain of foot 08/05/2019   Cholelithiasis with chronic cholecystitis without biliary obstruction 12/12/2017   Incarcerated umbilical hernia [id number]   Postmenopausal HRT (hormone replacement therapy) 08/11/2015   Peripheral neuropathy 10/06/2013   Hyperlipidemia 03/24/2012   THORACIC/LUMBOSACRAL NEURITIS/RADICULITIS UNSPEC 06/02/2008    ONSET DATE: 06/05/2022 referral  REFERRING DIAG: G62.89 (ICD-10-CM) - Mixed axonal-demyelinating polyneuropathy   THERAPY DIAG:  Other abnormalities of gait and mobility  Other lack of coordination  Unsteadiness on feet  Rationale for Evaluation and Treatment: Rehabilitation  SUBJECTIVE:  SUBJECTIVE STATEMENT: Patient reports doing well. Denies falls/near falls.   Pt accompanied by: self  PERTINENT HISTORY: axonal demyelinating polyneuropathy  PAIN:  Are you having pain? No  PRECAUTIONS: Fall  PATIENT GOALS: "to feel more confident with my balance"    TODAY'S TREATMENT:                                                                                                                               TherAct: Scifit hills level 3 B LE only x8 mins  Blaze pods toe tapping on alternating height surfaces and compliance of surfaces 2x3'  Tall kneeling -> half kneeling with U UE support  Progressed to tall kneeling -> half kneeling -> standing  Increased difficulty when powering up with R LE  PATIENT EDUCATION: Education details: Continue HEP Person educated: Patient Education method: Explanation Education comprehension: verbalized understanding  HOME EXERCISE PROGRAM: Access Code: [email or web address] URL:  [email or web address] Date: 06/25/2022 Prepared by: Malachi Carl  Exercises - Romberg Stance on Foam Pad with Head Rotation  - 1 x daily - 7 x weekly - 3 sets - 10 reps - Romberg Stance with Head Nods on Foam Pad  - 1 x daily - 7 x weekly - 3 sets - 10 reps - Romberg Stance Eyes Closed on Foam Pad  - 1 x daily - 7 x weekly - 3 sets - 30 hold - Single Leg Stance  - 1 x daily - 7 x weekly - 3 sets - 10 hold  GOALS: Goals reviewed with patient? Yes  SHORT TERM GOALS: = LTG based on POC length  LONG TERM GOALS: Target date: 08/02/22  Pt will be independent with final HEP for improved balance  Baseline: to be provided Goal status: INITIAL  2.  Pt will improve FGA to >/= 18/30 to demonstrate improved balance and reduced fall risk  Baseline: 13/30 Goal status: INITIAL  3.  Patient will score >/=75% on the ABC to demonstrate improved confidence with mobility Baseline: 60% Goal status: INITIAL  4.  Patient will complete at least 30s on condition 4 of the M-CTSIB to demonstrate improved balance Baseline: 8.4s Goal status: INITIAL  ASSESSMENT:  CLINICAL IMPRESSION: Patient seen for skilled PT session with emphasis on functional strength and balance retraining. Patient progressing well with dynamic tasks on compliant surfaces with noted improved in general agility as well. Would benefit from further power work with R LE. Continue POC.    OBJECTIVE IMPAIRMENTS: Abnormal gait, decreased balance, and difficulty walking.   ACTIVITY LIMITATIONS: carrying, lifting, squatting, stairs, bathing, locomotion level, and caring for others  PARTICIPATION LIMITATIONS: meal prep, cleaning, interpersonal relationship, driving, shopping, community activity, occupation, and yard work  PERSONAL FACTORS: Age and Time since onset of injury/illness/exacerbation are also affecting patient's functional outcome.   REHAB POTENTIAL: Good  CLINICAL DECISION MAKING:  Stable/uncomplicated  EVALUATION COMPLEXITY: Low  PLAN:  PT FREQUENCY: 2x/week  PT DURATION: 4 weeks  PLANNED INTERVENTIONS: Therapeutic exercises,  Therapeutic activity, Neuromuscular re-education, Balance training, Gait training, Patient/Family education, Self Care, Joint mobilization, Stair training, Vestibular training, Canalith repositioning, Visual/preceptual remediation/compensation, Orthotic/Fit training, DME instructions, Aquatic Therapy, Electrical stimulation, Taping, Manual therapy, and Re-evaluation  PLAN FOR NEXT SESSION: review initial HEP, balance on compliant surfaces, EC, practice carrying things (has to carry grandchildren), floor transfers without UE support progression (half kneel to stand activities), SLS progression with vestibular challenges + progress HEP, blaze pods, carrying things up the stairs  Debbora Dus, PT, DPT, CBIS 07/09/2022, 9:37 AM

## 2022-07-11 DIAGNOSIS — L814 Other melanin hyperpigmentation: Secondary | ICD-10-CM | POA: Diagnosis not present

## 2022-07-11 DIAGNOSIS — L821 Other seborrheic keratosis: Secondary | ICD-10-CM | POA: Diagnosis not present

## 2022-07-11 DIAGNOSIS — D2372 Other benign neoplasm of skin of left lower limb, including hip: Secondary | ICD-10-CM | POA: Diagnosis not present

## 2022-07-11 DIAGNOSIS — D225 Melanocytic nevi of trunk: Secondary | ICD-10-CM | POA: Diagnosis not present

## 2022-07-12 ENCOUNTER — Ambulatory Visit: Payer: BC Managed Care – PPO

## 2022-07-12 DIAGNOSIS — R2689 Other abnormalities of gait and mobility: Secondary | ICD-10-CM | POA: Diagnosis not present

## 2022-07-12 DIAGNOSIS — R2681 Unsteadiness on feet: Secondary | ICD-10-CM | POA: Diagnosis not present

## 2022-07-12 DIAGNOSIS — R278 Other lack of coordination: Secondary | ICD-10-CM

## 2022-07-12 NOTE — Therapy (Signed)
OUTPATIENT PHYSICAL THERAPY NEURO TREATMENT   Patient Name: Erica Daugherty MRN: [id number] DOB:1960/07/18, 62 y.o., female Today's Date: 07/12/2022   PCP: Rachell Cipro, MD REFERRING PROVIDER: Suzzanne Cloud, NP   END OF SESSION:  PT End of Session - 07/12/22 0931     Visit Number 6    Number of Visits 9    Date for PT Re-Evaluation 07/31/22    Authorization Type BCBS    PT Start Time 0930    PT Stop Time 1012    PT Time Calculation (min) 42 min    Equipment Utilized During Treatment Gait belt    Activity Tolerance Patient tolerated treatment well    Behavior During Therapy WFL for tasks assessed/performed             Past Medical History:  Diagnosis Date   Cholelithiasis with chronic cholecystitis without biliary obstruction 12/12/2017   Gallstones    History of decreased platelet count    Hyperlipidemia    Hypertension    Incarcerated umbilical hernia 99991111   Monoallelic mutation of ATM gene 06/25/2022   Neuropathy    Umbilical hernia    Past Surgical History:  Procedure Laterality Date   CHOLECYSTECTOMY N/A 12/12/2017   Procedure: LAPAROSCOPIC CHOLECYSTECTOMY WITH INTRAOPERATIVE CHOLANGIOGRAM ERAS PATHWAY;  Surgeon: Fanny Skates, MD;  Location: [facility];  Service: General;  Laterality: N/A;   EYE SURGERY  04/22/1962   lazy eye   no colonoscopy     03/26/13  & 04/28/14 SOC reviewed   SHOULDER SURGERY  04/22/2010   Left ; Dr Onnie Graham   UMBILICAL HERNIA REPAIR N/A 12/12/2017   No mesh. Procedure: REPAIR UMBILICAL HERNIA;  Surgeon: Fanny Skates, MD;  Location: [facility];  Service: General;  Laterality: N/A;   Patient Active Problem List   Diagnosis Date Noted   RLS (restless legs syndrome) 06/26/2022   Excessive daytime sleepiness 06/26/2022   Risk factors for obstructive sleep apnea 06/26/2022   Loud snoring 06/26/2022   Morbid obesity ([place]) [id number]   Monoallelic mutation of ATM gene 06/25/2022   Genetic testing 06/25/2022   Restless leg syndrome  01/18/2020   Gait abnormality 08/30/2019   Mixed axonal-demyelinating polyneuropathy 08/30/2019   Neuropathic pain of foot 08/05/2019   Cholelithiasis with chronic cholecystitis without biliary obstruction 12/12/2017   Incarcerated umbilical hernia [id number]   Postmenopausal HRT (hormone replacement therapy) 08/11/2015   Peripheral neuropathy 10/06/2013   Hyperlipidemia 03/24/2012   THORACIC/LUMBOSACRAL NEURITIS/RADICULITIS UNSPEC 06/02/2008    ONSET DATE: 06/05/2022 referral  REFERRING DIAG: G62.89 (ICD-10-CM) - Mixed axonal-demyelinating polyneuropathy   THERAPY DIAG:  Other abnormalities of gait and mobility  Other lack of coordination  Unsteadiness on feet  Rationale for Evaluation and Treatment: Rehabilitation  SUBJECTIVE:  SUBJECTIVE STATEMENT: Patient reports doing well. Denies falls/near falls.   Pt accompanied by: self  PERTINENT HISTORY: axonal demyelinating polyneuropathy  PAIN:  Are you having pain? No  PRECAUTIONS: Fall  PATIENT GOALS: "to feel more confident with my balance"    TODAY'S TREATMENT:                                                                                                                              NMR: -scifit x10 mins B LE only level 4 -updated HEP for increased challenge (see bolded below)   -completed on solid ground, but provided options for progressing to compliant surface for increased challenge  PATIENT EDUCATION: Education details: Continue HEP, updated HEP, handout on polyneuropathy and neuropathic pain Person educated: Patient Education method: Explanation Education comprehension: verbalized understanding  HOME EXERCISE PROGRAM: Access Code: [email or web address] URL: [email or web address] Date: 06/25/2022 Prepared by: Malachi Carl  Exercises - Romberg Stance on Foam Pad with Head Rotation  - 1 x daily - 7 x weekly - 3 sets - 10 reps - Romberg Stance with Head Nods on Foam Pad  - 1 x daily - 7 x weekly - 3 sets - 10 reps - Romberg Stance Eyes Closed on Foam Pad  - 1 x daily - 7 x weekly - 3 sets - 30 hold - Single Leg Stance  - 1 x daily - 7 x weekly - 3 sets - 10 hold  - Single Leg Balance with Opposite Leg Star Reach  - 1 x daily - 7 x weekly - 2 sets - 10 reps - Half Kneeling Diagonal Lift with Narrow Balance  - 1 x daily - 7 x weekly - 2 sets - 10 reps - Single Leg Balance with Alternate Arm Raise  - 1 x daily - 7 x weekly - 3 sets - 30 hold  GOALS: Goals reviewed with patient? Yes  SHORT TERM GOALS: = LTG based on POC length  LONG TERM GOALS: Target date: 08/02/22  Pt will be independent with final HEP for improved balance  Baseline: to be provided Goal status: INITIAL  2.  Pt will improve FGA to >/= 18/30 to demonstrate improved balance and reduced fall risk  Baseline: 13/30 Goal status: INITIAL  3.  Patient will score >/=75% on the ABC to demonstrate improved confidence with mobility Baseline: 60% Goal status: INITIAL  4.  Patient will complete at least 30s on condition 4 of the M-CTSIB to demonstrate improved balance Baseline: 8.4s Goal status: INITIAL  ASSESSMENT:  CLINICAL IMPRESSION: Patient seen for skilled PT session with emphasis on balance retraining. Appropriate progression in balance challenge provided for HEP. Does continue to have difficulties on compliant surfaces and NBOS/SLS, but appropriate ankle strategy utilized. Continue POC.    OBJECTIVE IMPAIRMENTS: Abnormal gait, decreased balance, and difficulty walking.   ACTIVITY LIMITATIONS: carrying, lifting, squatting, stairs, bathing, locomotion level, and caring for others  PARTICIPATION LIMITATIONS: meal prep, cleaning, interpersonal relationship, driving, shopping,  community activity, occupation, and yard work  PERSONAL  FACTORS: Age and Time since onset of injury/illness/exacerbation are also affecting patient's functional outcome.   REHAB POTENTIAL: Good  CLINICAL DECISION MAKING: Stable/uncomplicated  EVALUATION COMPLEXITY: Low  PLAN:  PT FREQUENCY: 2x/week  PT DURATION: 4 weeks  PLANNED INTERVENTIONS: Therapeutic exercises, Therapeutic activity, Neuromuscular re-education, Balance training, Gait training, Patient/Family education, Self Care, Joint mobilization, Stair training, Vestibular training, Canalith repositioning, Visual/preceptual remediation/compensation, Orthotic/Fit training, DME instructions, Aquatic Therapy, Electrical stimulation, Taping, Manual therapy, and Re-evaluation  PLAN FOR NEXT SESSION: review initial HEP, balance on compliant surfaces, EC, practice carrying things (has to carry grandchildren), floor transfers without UE support progression (half kneel to stand activities), SLS progression with vestibular challenges + progress HEP, blaze pods, carrying things up the stairs  Debbora Dus, PT, DPT, CBIS 07/12/2022, 10:28 AM

## 2022-07-16 ENCOUNTER — Encounter: Payer: Self-pay | Admitting: Physical Therapy

## 2022-07-16 ENCOUNTER — Telehealth: Payer: Self-pay | Admitting: Neurology

## 2022-07-16 ENCOUNTER — Ambulatory Visit: Payer: BC Managed Care – PPO | Admitting: Physical Therapy

## 2022-07-16 DIAGNOSIS — R2681 Unsteadiness on feet: Secondary | ICD-10-CM

## 2022-07-16 DIAGNOSIS — R278 Other lack of coordination: Secondary | ICD-10-CM

## 2022-07-16 DIAGNOSIS — R2689 Other abnormalities of gait and mobility: Secondary | ICD-10-CM

## 2022-07-16 NOTE — Telephone Encounter (Signed)
MAIL OUT HST- BCBS Josem Kaufmann: [id number] (exp. 07/11/22 to 09/08/22)   Patient is on the schedule for 07/24/22.

## 2022-07-16 NOTE — Therapy (Signed)
OUTPATIENT PHYSICAL THERAPY NEURO TREATMENT   Patient Name: Erica Daugherty MRN: [id number] DOB:02-Jun-1960, 62 y.o., female Today's Date: 07/16/2022  PCP: Rachell Cipro, MD REFERRING PROVIDER: Suzzanne Cloud, NP   END OF SESSION:  PT End of Session - 07/16/22 0936     Visit Number 7    Number of Visits 9    Date for PT Re-Evaluation 07/31/22    Authorization Type BCBS    PT Start Time 0935    PT Stop Time 1015    PT Time Calculation (min) 40 min    Equipment Utilized During Treatment Gait belt    Activity Tolerance Patient tolerated treatment well    Behavior During Therapy [facility] for tasks assessed/performed             Past Medical History:  Diagnosis Date   Cholelithiasis with chronic cholecystitis without biliary obstruction 12/12/2017   Gallstones    History of decreased platelet count    Hyperlipidemia    Hypertension    Incarcerated umbilical hernia [id number]   Monoallelic mutation of ATM gene 06/25/2022   Neuropathy    Umbilical hernia    Past Surgical History:  Procedure Laterality Date   CHOLECYSTECTOMY N/A 12/12/2017   Procedure: LAPAROSCOPIC CHOLECYSTECTOMY WITH INTRAOPERATIVE CHOLANGIOGRAM ERAS PATHWAY;  Surgeon: Fanny Skates, MD;  Location: [place];  Service: General;  Laterality: N/A;   EYE SURGERY  04/22/1962   lazy eye   no colonoscopy     03/26/13  & 04/28/14 [place] reviewed   SHOULDER SURGERY  04/22/2010   Left ; Dr Onnie Graham   UMBILICAL HERNIA REPAIR N/A 12/12/2017   No mesh. Procedure: REPAIR UMBILICAL HERNIA;  Surgeon: Fanny Skates, MD;  Location: [facility];  Service: General;  Laterality: N/A;   Patient Active Problem List   Diagnosis Date Noted   RLS (restless legs syndrome) 06/26/2022   Excessive daytime sleepiness 06/26/2022   Risk factors for obstructive sleep apnea 06/26/2022   Loud snoring 06/26/2022   Morbid obesity ([place]) [id number]   Monoallelic mutation of ATM gene 06/25/2022   Genetic testing 06/25/2022   Restless leg syndrome  01/18/2020   Gait abnormality 08/30/2019   Mixed axonal-demyelinating polyneuropathy 08/30/2019   Neuropathic pain of foot 08/05/2019   Cholelithiasis with chronic cholecystitis without biliary obstruction 12/12/2017   Incarcerated umbilical hernia [id number]   Postmenopausal HRT (hormone replacement therapy) 08/11/2015   Peripheral neuropathy 10/06/2013   Hyperlipidemia 03/24/2012   THORACIC/LUMBOSACRAL NEURITIS/RADICULITIS UNSPEC 06/02/2008    ONSET DATE: 06/05/2022 referral  REFERRING DIAG: G62.89 (ICD-10-CM) - Mixed axonal-demyelinating polyneuropathy   THERAPY DIAG:  Other abnormalities of gait and mobility  Other lack of coordination  Unsteadiness on feet  Rationale for Evaluation and Treatment: Rehabilitation  SUBJECTIVE:  SUBJECTIVE STATEMENT: Patient reports doing well; she hasn't been able to do her updated exercises yet but was glad they were updated. Patient reports frustration with getting dressed in the morning due to not being able to stand on her R leg without UE support due to fear of falling and similar when shaving in shower. Denies falls/near falls.   Pt accompanied by: self  PERTINENT HISTORY: axonal demyelinating polyneuropathy  PAIN:  Are you having pain? No  PRECAUTIONS: Fall  PATIENT GOALS: "to feel more confident with my balance"   TODAY'S TREATMENT:                                                                                                                               NMR: - Semicircle blaze pod taps working on single leg stance stability random call out with 6" step with a few blaze pods on 6" step 1 x 20 (bil)  - (CGA, minA x 1 for LOB) - Semicircle blaze pod taps working on single leg stance stability working on not setting down elevated leg between  reps with a few blaze pods on 6" step 1 x 20 (bil)  (minA x 1 for LOB) - Semicircle blaze pod taps working on single leg stance stability working on not setting down elevated leg between reps out/in pattern with two pods on 6" step 3 x 6 (bil)  -  (CGA, minA x 1 for LOB) - SLS trials 8 x 2-4" holds bilaterally with UE support as needed - Modified SLS styrofoam cup holds 6 x 4-10" for error augmentation without UE support (SBA)  TherAct:  - Stairs with single UE support - 1 x 4 steps step through (SGA) - Stairs with no UE support - 1 x 4 steps step through (SBA) - Stairs with no UE support holding onto box - 1 x 4 steps step through (SBA) - Stairs with no UE support holding onto box with 10# - 1 x 4 steps step through ascent/ step to descent (SBA) - Stairs with no UE support holding onto box with 20# - 1 x 4 steps step through ascent/ step to descent(SBA)  - increased unsteadiness noted, shifts box out of way for spotting step, leans on railing for increased support,   PATIENT EDUCATION: Education details: Continue HEP Person educated: Patient Education method: Explanation Education comprehension: verbalized understanding  HOME EXERCISE PROGRAM: Access Code: [email or web address] URL: [email or web address] Date: 06/25/2022 Prepared by: Malachi Carl  Exercises - Romberg Stance on Foam Pad with Head Rotation  - 1 x daily - 7 x weekly - 3 sets - 10 reps - Romberg Stance with Head Nods on Foam Pad  - 1 x daily - 7 x weekly - 3 sets - 10 reps - Romberg Stance Eyes Closed on Foam Pad  - 1 x daily - 7 x weekly - 3 sets - 30 hold - Single Leg Stance  - 1 x daily - 7 x weekly -  3 sets - 10 hold - regress with modified single leg stance with one foot on styrofoam cup - Single Leg Balance with Opposite Leg Star Reach  - 1 x daily - 7 x weekly - 2 sets - 10 reps - Half Kneeling Diagonal Lift with Narrow Balance  - 1 x daily - 7 x weekly - 2 sets - 10 reps - Single Leg Balance with Alternate  Arm Raise  - 1 x daily - 7 x weekly - 3 sets - 30 hold  GOALS: Goals reviewed with patient? Yes  SHORT TERM GOALS: = LTG based on POC length  LONG TERM GOALS: Target date: 08/02/22  Pt will be independent with final HEP for improved balance  Baseline: to be provided Goal status: INITIAL  2.  Pt will improve FGA to >/= 18/30 to demonstrate improved balance and reduced fall risk  Baseline: 13/30 Goal status: INITIAL  3.  Patient will score >/=75% on the ABC to demonstrate improved confidence with mobility Baseline: 60% Goal status: INITIAL  4.  Patient will complete at least 30s on condition 4 of the M-CTSIB to demonstrate improved balance Baseline: 8.4s Goal status: INITIAL  ASSESSMENT:  CLINICAL IMPRESSION: Session emphasized emphasis on modified SLS activities to address concerns about getting dressed and stair navigation while holding weighted objects. Patient should not attempt dressing at this time without UE support given level of SLS. Patient relies strongly on visual cues with stairs and has increased difficulty navigating when she is carrying something blocking views. Modifies to step to gait pattern and is appropriately safe. Continue POC.    OBJECTIVE IMPAIRMENTS: Abnormal gait, decreased balance, and difficulty walking.   ACTIVITY LIMITATIONS: carrying, lifting, squatting, stairs, bathing, locomotion level, and caring for others  PARTICIPATION LIMITATIONS: meal prep, cleaning, interpersonal relationship, driving, shopping, community activity, occupation, and yard work  PERSONAL FACTORS: Age and Time since onset of injury/illness/exacerbation are also affecting patient's functional outcome.   REHAB POTENTIAL: Good  CLINICAL DECISION MAKING: Stable/uncomplicated  EVALUATION COMPLEXITY: Low  PLAN:  PT FREQUENCY: 2x/week  PT DURATION: 4 weeks  PLANNED INTERVENTIONS: Therapeutic exercises, Therapeutic activity, Neuromuscular re-education, Balance training, Gait  training, Patient/Family education, Self Care, Joint mobilization, Stair training, Vestibular training, Canalith repositioning, Visual/preceptual remediation/compensation, Orthotic/Fit training, DME instructions, Aquatic Therapy, Electrical stimulation, Taping, Manual therapy, and Re-evaluation  PLAN FOR NEXT SESSION: review initial HEP, balance on compliant surfaces, EC, practice carrying things (has to carry grandchildren), floor transfers without UE support progression (half kneel to stand activities), SLS progression with vestibular challenges + progress HEP, blaze pods, carrying things up the stairs, possible D/C versus adding one more visit before end of cert, education on safety with getting dressed  Esperanza Heir, PT, DPT 07/16/2022, 4:43 PM

## 2022-07-18 ENCOUNTER — Ambulatory Visit: Payer: BC Managed Care – PPO

## 2022-07-22 ENCOUNTER — Ambulatory Visit: Payer: BC Managed Care – PPO

## 2022-07-23 ENCOUNTER — Ambulatory Visit: Payer: BC Managed Care – PPO | Attending: Neurology

## 2022-07-23 DIAGNOSIS — R2689 Other abnormalities of gait and mobility: Secondary | ICD-10-CM | POA: Diagnosis not present

## 2022-07-23 DIAGNOSIS — R278 Other lack of coordination: Secondary | ICD-10-CM | POA: Diagnosis not present

## 2022-07-23 DIAGNOSIS — R2681 Unsteadiness on feet: Secondary | ICD-10-CM | POA: Diagnosis not present

## 2022-07-23 NOTE — Therapy (Signed)
OUTPATIENT PHYSICAL THERAPY NEURO TREATMENT   Patient Name: Erica Daugherty MRN: [id number] DOB:[birth year], 62 y.o., female Today's Date: 07/23/2022  PCP: Rachell Cipro, MD REFERRING PROVIDER: Suzzanne Cloud, NP   PHYSICAL THERAPY DISCHARGE SUMMARY  Visits from Start of Care: 8  Current functional level related to goals / functional outcomes: See below   Remaining deficits: See below   Education / Equipment: PT POC, HEP, fall precautions   Patient agrees to discharge. Patient goals were met. Patient is being discharged due to meeting the stated rehab goals.  END OF SESSION:  PT End of Session - 07/23/22 0932     Visit Number 8    Number of Visits 9    Date for PT Re-Evaluation 07/31/22    Authorization Type BCBS    PT Start Time 0931    PT Stop Time 1010    PT Time Calculation (min) 39 min    Equipment Utilized During Treatment Gait belt    Activity Tolerance Patient tolerated treatment well    Behavior During Therapy WFL for tasks assessed/performed             Past Medical History:  Diagnosis Date   Cholelithiasis with chronic cholecystitis without biliary obstruction 12/12/2017   Gallstones    History of decreased platelet count    Hyperlipidemia    Hypertension    Incarcerated umbilical hernia [id number]   Monoallelic mutation of ATM gene 06/25/2022   Neuropathy    Umbilical hernia    Past Surgical History:  Procedure Laterality Date   CHOLECYSTECTOMY N/A 12/12/2017   Procedure: LAPAROSCOPIC CHOLECYSTECTOMY WITH INTRAOPERATIVE CHOLANGIOGRAM ERAS PATHWAY;  Surgeon: Fanny Skates, MD;  Location: [place];  Service: General;  Laterality: N/A;   EYE SURGERY  04/22/1962   lazy eye   no colonoscopy     03/26/13  & 04/28/14 [facility] reviewed   SHOULDER SURGERY  04/22/2010   Left ; Dr Onnie Graham   UMBILICAL HERNIA REPAIR N/A 12/12/2017   No mesh. Procedure: REPAIR UMBILICAL HERNIA;  Surgeon: Fanny Skates, MD;  Location: [place];  Service: General;  Laterality: N/A;    Patient Active Problem List   Diagnosis Date Noted   RLS (restless legs syndrome) 06/26/2022   Excessive daytime sleepiness 06/26/2022   Risk factors for obstructive sleep apnea 06/26/2022   Loud snoring 06/26/2022   Morbid obesity [id number]   Monoallelic mutation of ATM gene 06/25/2022   Genetic testing 06/25/2022   Restless leg syndrome 01/18/2020   Gait abnormality 08/30/2019   Mixed axonal-demyelinating polyneuropathy 08/30/2019   Neuropathic pain of foot 08/05/2019   Cholelithiasis with chronic cholecystitis without biliary obstruction 12/12/2017   Incarcerated umbilical hernia [id number]   Postmenopausal HRT (hormone replacement therapy) 08/11/2015   Peripheral neuropathy 10/06/2013   Hyperlipidemia 03/24/2012   THORACIC/LUMBOSACRAL NEURITIS/RADICULITIS UNSPEC 06/02/2008    ONSET DATE: 06/05/2022 referral  REFERRING DIAG: G62.89 (ICD-10-CM) - Mixed axonal-demyelinating polyneuropathy   THERAPY DIAG:  Other abnormalities of gait and mobility  Unsteadiness on feet  Other lack of coordination  Rationale for Evaluation and Treatment: Rehabilitation  SUBJECTIVE:  SUBJECTIVE STATEMENT: Patient reports doing well. Denies falls/near falls. Has done updated exercises and tolerated well.   Pt accompanied by: self  PERTINENT HISTORY: axonal demyelinating polyneuropathy  PAIN:  Are you having pain? No  PRECAUTIONS: Fall  PATIENT GOALS: "to feel more confident with my balance"   TODAY'S TREATMENT:                                                                                                                              Goal assessment: ABC: 84%  [facility] PT Assessment - 07/23/22 0001       Functional Gait  Assessment   Gait assessed  Yes    Gait Level Surface Walks 20 ft in less  than 5.5 sec, no assistive devices, good speed, no evidence for imbalance, normal gait pattern, deviates no more than 6 in outside of the 12 in walkway width.    Change in Gait Speed Able to smoothly change walking speed without loss of balance or gait deviation. Deviate no more than 6 in outside of the 12 in walkway width.    Gait with Horizontal Head Turns Performs head turns smoothly with slight change in gait velocity (eg, minor disruption to smooth gait path), deviates 6-10 in outside 12 in walkway width, or uses an assistive device.    Gait with Vertical Head Turns Performs task with slight change in gait velocity (eg, minor disruption to smooth gait path), deviates 6 - 10 in outside 12 in walkway width or uses assistive device    Gait and Pivot Turn Pivot turns safely in greater than 3 sec and stops with no loss of balance, or pivot turns safely within 3 sec and stops with mild imbalance, requires small steps to catch balance.    Step Over Obstacle Is able to step over one shoe box (4.5 in total height) without changing gait speed. No evidence of imbalance.    Gait with Narrow Base of Support Is able to ambulate for 10 steps heel to toe with no staggering.    Gait with Eyes Closed Walks 20 ft, slow speed, abnormal gait pattern, evidence for imbalance, deviates 10-15 in outside 12 in walkway width. Requires more than 9 sec to ambulate 20 ft.    Ambulating Backwards Walks 20 ft, uses assistive device, slower speed, mild gait deviations, deviates 6-10 in outside 12 in walkway width.    Steps Alternating feet, must use rail.    Total Score 22           M-CTSIB -condition 1: 30s no sway -condition 2: 30s mild sway  -condition 3: 30s no sway -condition 4: 30s mild sway   PATIENT EDUCATION: Education details: PT POC, yoga classes, exam findings Person educated: Patient Education method: Explanation Education comprehension: verbalized understanding  HOME EXERCISE PROGRAM: Access Code:  [email or web address] URL: [email or web address] Date: 06/25/2022 Prepared by: Malachi Carl  Exercises - Romberg Stance on Foam Pad with Head Rotation  - 1 x  daily - 7 x weekly - 3 sets - 10 reps - Romberg Stance with Head Nods on Foam Pad  - 1 x daily - 7 x weekly - 3 sets - 10 reps - Romberg Stance Eyes Closed on Foam Pad  - 1 x daily - 7 x weekly - 3 sets - 30 hold - Single Leg Stance  - 1 x daily - 7 x weekly - 3 sets - 10 hold - regress with modified single leg stance with one foot on styrofoam cup - Single Leg Balance with Opposite Leg Star Reach  - 1 x daily - 7 x weekly - 2 sets - 10 reps - Half Kneeling Diagonal Lift with Narrow Balance  - 1 x daily - 7 x weekly - 2 sets - 10 reps - Single Leg Balance with Alternate Arm Raise  - 1 x daily - 7 x weekly - 3 sets - 30 hold  GOALS: Goals reviewed with patient? Yes  SHORT TERM GOALS: = LTG based on POC length  LONG TERM GOALS: Target date: 08/02/22  Pt will be independent with final HEP for improved balance  Baseline: to be provided; provided Goal status: MET  2.  Pt will improve FGA to >/= 18/30 to demonstrate improved balance and reduced fall risk  Baseline: 13/30; 22/30 Goal status: MET  3.  Patient will score >/=75% on the ABC to demonstrate improved confidence with mobility Baseline: 60%; 84% Goal status: MET  4.  Patient will complete at least 30s on condition 4 of the M-CTSIB to demonstrate improved balance Baseline: 8.4s; 30s Goal status: MET  ASSESSMENT:  CLINICAL IMPRESSION: Patient seen for skilled PT session with emphasis on goal assessment and dc. She met 4/4 LTG. Patient scoring a 22/30 on  Functional Gait Assessment.   <22/30 = predictive of falls, <20/30 = fall in 6 months, <18/30 = predictive of falls in PD MCID: 5 points stroke population, 4 points geriatric population (ANPTA Core Set of Outcome Measures for Adults with Neurologic Conditions, 2018). She was able to complete 30s of condition 4 of  M-CTSIB with minimal sway, which is a significant improvement from her original 8.4s. Patient remains with some balance deficits with tandem stance/gait and SLS, but progressing well. Patient to dc from PT at this time.     OBJECTIVE IMPAIRMENTS: Abnormal gait, decreased balance, and difficulty walking.   ACTIVITY LIMITATIONS: carrying, lifting, squatting, stairs, bathing, locomotion level, and caring for others  PARTICIPATION LIMITATIONS: meal prep, cleaning, interpersonal relationship, driving, shopping, community activity, occupation, and yard work  PERSONAL FACTORS: Age and Time since onset of injury/illness/exacerbation are also affecting patient's functional outcome.   REHAB POTENTIAL: Good  CLINICAL DECISION MAKING: Stable/uncomplicated  EVALUATION COMPLEXITY: Low  PLAN:  PT FREQUENCY: 2x/week  PT DURATION: 4 weeks  PLANNED INTERVENTIONS: Therapeutic exercises, Therapeutic activity, Neuromuscular re-education, Balance training, Gait training, Patient/Family education, Self Care, Joint mobilization, Stair training, Vestibular training, Canalith repositioning, Visual/preceptual remediation/compensation, Orthotic/Fit training, DME instructions, Aquatic Therapy, Electrical stimulation, Taping, Manual therapy, and Re-evaluation  PLAN FOR NEXT SESSION: dc from PT  Debbora Dus, PT, DPT, CBIS 07/23/2022, 10:17 AM

## 2022-07-24 ENCOUNTER — Ambulatory Visit: Payer: BC Managed Care – PPO | Admitting: Neurology

## 2022-07-24 DIAGNOSIS — G4733 Obstructive sleep apnea (adult) (pediatric): Secondary | ICD-10-CM

## 2022-07-24 DIAGNOSIS — G6289 Other specified polyneuropathies: Secondary | ICD-10-CM

## 2022-07-24 DIAGNOSIS — R0683 Snoring: Secondary | ICD-10-CM

## 2022-07-24 DIAGNOSIS — G4719 Other hypersomnia: Secondary | ICD-10-CM

## 2022-07-24 DIAGNOSIS — Z9189 Other specified personal risk factors, not elsewhere classified: Secondary | ICD-10-CM

## 2022-07-24 DIAGNOSIS — G2581 Restless legs syndrome: Secondary | ICD-10-CM

## 2022-07-25 ENCOUNTER — Inpatient Hospital Stay: Payer: BC Managed Care – PPO | Attending: Genetic Counselor | Admitting: Hematology and Oncology

## 2022-07-25 ENCOUNTER — Inpatient Hospital Stay: Payer: BC Managed Care – PPO

## 2022-07-25 ENCOUNTER — Other Ambulatory Visit: Payer: Self-pay

## 2022-07-25 VITALS — BP 135/75 | HR 82 | Temp 99.5°F | Resp 15 | Wt 249.8 lb

## 2022-07-25 DIAGNOSIS — Z1589 Genetic susceptibility to other disease: Secondary | ICD-10-CM | POA: Diagnosis not present

## 2022-07-25 DIAGNOSIS — Z1501 Genetic susceptibility to malignant neoplasm of breast: Secondary | ICD-10-CM | POA: Diagnosis not present

## 2022-07-25 DIAGNOSIS — Z7183 Encounter for nonprocreative genetic counseling: Secondary | ICD-10-CM | POA: Diagnosis not present

## 2022-07-25 DIAGNOSIS — J302 Other seasonal allergic rhinitis: Secondary | ICD-10-CM | POA: Diagnosis not present

## 2022-07-25 DIAGNOSIS — J069 Acute upper respiratory infection, unspecified: Secondary | ICD-10-CM | POA: Diagnosis not present

## 2022-07-25 DIAGNOSIS — Z1509 Genetic susceptibility to other malignant neoplasm: Secondary | ICD-10-CM | POA: Diagnosis not present

## 2022-07-25 DIAGNOSIS — J40 Bronchitis, not specified as acute or chronic: Secondary | ICD-10-CM | POA: Diagnosis not present

## 2022-07-25 NOTE — Progress Notes (Signed)
[place] NOTE  Patient Care Team: Fanny Bien, MD as PCP - General (Family Medicine) Dennie Bible, NP as Nurse Practitioner (Family Medicine)  CHIEF COMPLAINTS/PURPOSE OF CONSULTATION:  High risk for breast cancer  HISTORY OF PRESENTING ILLNESS:  At high risk for breast cancer  SUMMARY OF ONCOLOGIC HISTORY: Oncology History   No history exists.   This is a very pleasant 62 year old female patient with past medical history significant for angioleiomyoma's and was concerned about hereditary predisposition to cancer.  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  Patient did not have any personal history of abnormal breast imaging requiring biopsies.  She had mammogram which required a second look.  Her mom is at GERD and she does not know any history of cancer on her mom side.  Her dad however had prostate cancer and her brother had testicular cancer.  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  She denies any other complaints today.  Rest of the pertinent 10 point ROS reviewed and negative  MEDICAL HISTORY:  Past Medical History:  Diagnosis Date   Cholelithiasis with chronic cholecystitis without biliary obstruction 12/12/2017   Gallstones    History of decreased platelet count    Hyperlipidemia    Hypertension    Incarcerated umbilical hernia [id number]   Monoallelic mutation of ATM gene 06/25/2022   Neuropathy    Umbilical hernia     SURGICAL HISTORY: Past Surgical History:  Procedure Laterality Date   CHOLECYSTECTOMY N/A 12/12/2017   Procedure: LAPAROSCOPIC CHOLECYSTECTOMY WITH INTRAOPERATIVE CHOLANGIOGRAM ERAS PATHWAY;  Surgeon: Fanny Skates, MD;  Location: [facility];  Service: General;  Laterality: N/A;   EYE SURGERY  04/22/1962   lazy eye   no colonoscopy     03/26/13  &  04/28/14 SOC reviewed   SHOULDER SURGERY  04/22/2010   Left ; Dr Onnie Graham   UMBILICAL HERNIA REPAIR N/A 12/12/2017   No mesh. Procedure: REPAIR UMBILICAL HERNIA;  Surgeon: Fanny Skates, MD;  Location: [facility];  Service: General;  Laterality: N/A;    SOCIAL HISTORY: Social History   Socioeconomic History   Marital status: Married    Spouse name: [name]   Number of children: 4   Years of education: college   Highest education level: Not on file  Occupational History   Occupation: Pharmacist, hospital: SELF EMPLOYED  Tobacco Use   Smoking status: Never   Smokeless tobacco: Never  Vaping Use   Vaping Use: Never used  Substance and Sexual Activity   Alcohol use: Yes    Comment: occ   Drug use: No   Sexual activity: Yes    Partners: Male    Birth control/protection: Post-menopausal, Surgical    Comment: vasectomy  Other Topics Concern   Not on file  Social History Narrative   Patient lives at home with her husband [name]). Patient works full time.   Education college   Right handed.   Caffeine sometimes one cup of sweet tea.   Social Determinants of Health   Financial Resource Strain: Not on file  Food Insecurity: Not on file  Transportation Needs: Not on file  Physical Activity: Not on file  Stress: Not on file  Social Connections: Not on file  Intimate Partner Violence: Not on file    FAMILY HISTORY: Family  History  Problem Relation Age of Onset   Diabetes Mother    Hyperlipidemia Mother    Hypertension Mother    Thyroid nodules Mother    Prostate cancer Father 8   Testicular cancer Brother 75   Diabetes Brother    Uterine cancer Paternal Grandmother        dx unknown age   Stroke Neg Hx    Heart disease Neg Hx    Colon cancer Neg Hx    Stomach cancer Neg Hx     ALLERGIES:  has No Known Allergies.  MEDICATIONS:  Current Outpatient Medications  Medication Sig Dispense Refill   carvedilol (COREG) 12.5 MG tablet TAKE 1 tablet orally 2  times per day with food     chlorthalidone (HYGROTON) 25 MG tablet Take 12.5 mg by mouth daily.     Cholecalciferol (VITAMIN D PO) Take 5,000 Int'l Units/day by mouth.     Cyanocobalamin 2500 MCG TABS Take 2,500 mcg by mouth daily.      DULoxetine (CYMBALTA) 60 MG capsule Take 1 capsule (60 mg total) by mouth daily. 90 capsule 4   gabapentin (NEURONTIN) 300 MG capsule TAKE FOUR CAPSULES BY MOUTH AT BEDTIME 360 capsule 4   L-Methylfolate-Algae-B12-B6 (METANX) 3-90.314-2-35 MG CAPS TAKE 1 CAPSULE BY MOUTH TWO TIMES A DAY 180 capsule 2   phentermine 15 MG capsule Take 15 mg by mouth every morning.     rOPINIRole (REQUIP) 1 MG tablet Take 2 tablets (2 mg total) by mouth at bedtime. 180 tablet 3   rosuvastatin (CRESTOR) 5 MG tablet Take 5 mg by mouth daily.     valsartan (DIOVAN) 320 MG tablet take1 tablet orally daily for High blood pressure     No current facility-administered medications for this visit.    REVIEW OF SYSTEMS:   Constitutional: Denies fevers, chills or abnormal night sweats Eyes: Denies blurriness of vision, double vision or watery eyes Ears, nose, mouth, throat, and face: Denies mucositis or sore throat Respiratory: Denies cough, dyspnea or wheezes Cardiovascular: Denies palpitation, chest discomfort or lower extremity swelling Gastrointestinal:  Denies nausea, heartburn or change in bowel habits Skin: Denies abnormal skin rashes Lymphatics: Denies new lymphadenopathy or easy bruising Neurological:Denies numbness, tingling or new weaknesses Behavioral/Psych: Mood is stable, no new changes  Breast: Denies any palpable lumps or discharge All other systems were reviewed with the patient and are negative.  PHYSICAL EXAMINATION: ECOG PERFORMANCE STATUS: 0 - Asymptomatic  Vitals:   07/25/22 1013  BP: 135/75  Pulse: 82  Resp: 15  Temp: 99.5 F (37.5 C)  SpO2: 96%   Filed Weights   07/25/22 1013  Weight: 249 lb 12.8 oz (113.3 kg)    GENERAL:alert, no distress and  comfortable Neck: No cervical adenopathy BREAST: Bilateral breasts inspected.  No palpable masses or regional adenopathy Bilateral lower extremities without any edema  LABORATORY DATA:  I have reviewed the data as listed Lab Results  Component Value Date   WBC 7.3 08/05/2019   HGB 13.0 08/05/2019   HCT 38.5 08/05/2019   MCV 89 08/05/2019   PLT 237 08/05/2019   Lab Results  Component Value Date   NA 141 08/05/2019   K 3.5 08/05/2019   CL 99 08/05/2019   CO2 29 08/05/2019    RADIOGRAPHIC STUDIES: I have personally reviewed the radiological reports and agreed with the findings in the report.  ASSESSMENT AND PLAN:  This is a very pleasant 62 year old postmenopausal female patient with single pathogenic ATM mutation referred to high  risk breast clinic for additional recommendations  ATM heterozygous gene mutation:   It is estimated that approximately 3 percent of White people in the [place] are ATM heterozygotes I discussed with the patient that having ATM gene mutation leads to 2-3 times increased risk of breast cancer which translates into a 30% lifetime risk of breast cancer. In two large cohort studies, it was thought that ER positive breast cancer is common than ER negative breast cancer among ATM carriers.  Intervention  For those with pathogenic variants in ATM, we typically initiate annual mammography with tomography and annual MRI, starting at age 54 years, given evidence of moderately increased lifetime risk of breast cancer.  The evidence is insufficient to uniformly recommend RRM, although for those with a concerning family history (>20 percent risk of breast cancer by a model), it may be reasonable for carriers to consider this option  Pathogenic variants in ATM do not appear to confer a significantly increased risk for ovarian cancer. For carriers who have a family history of ovarian cancer, we discuss the potential risks and benefits of rrBSO.  For ATM  carriers with a family history of pancreatic cancer, pancreatic cancer screening is offered with annual MRCP or EUS. We have discussed unknown long-term risk of gadolinium deposition and increased sensitivity of MRIs can lead to additional biopsies.  She is interested in pursuing MRI screening.  We discussed different risk reducing strategies for future breast cancer risk. We discussed chemoprevention and lifestyle modification.  I have discussed briefly about tamoxifen versus aromatase inhibitors for breast cancer prevention.  However given her 5-year risk less than women of her age, we did not proceed with endocrine prevention.  Lifestyle modification: We discussed different interventions exercise at least 5 days a week including both aerobic as well as weight-bearing exercises and strength training. He discussed dietary modification including increasing number of servings of fruit and vegetables as well as decreased in number of servings of meat. We discussed the importance of maintaining a good BMI and limiting alcohol intake.  Followup: Patient will be seen back in 1 yr for follow up. We will check her CBC as well as cmet at the time.  All questions were answered. The patient knows to call the clinic with any problems, questions or concerns.    Benay Pike, MD 07/25/22

## 2022-07-31 DIAGNOSIS — I10 Essential (primary) hypertension: Secondary | ICD-10-CM | POA: Diagnosis not present

## 2022-07-31 DIAGNOSIS — Z1159 Encounter for screening for other viral diseases: Secondary | ICD-10-CM | POA: Diagnosis not present

## 2022-07-31 DIAGNOSIS — J40 Bronchitis, not specified as acute or chronic: Secondary | ICD-10-CM | POA: Diagnosis not present

## 2022-07-31 DIAGNOSIS — J309 Allergic rhinitis, unspecified: Secondary | ICD-10-CM | POA: Diagnosis not present

## 2022-07-31 DIAGNOSIS — H6122 Impacted cerumen, left ear: Secondary | ICD-10-CM | POA: Diagnosis not present

## 2022-07-31 DIAGNOSIS — J069 Acute upper respiratory infection, unspecified: Secondary | ICD-10-CM | POA: Diagnosis not present

## 2022-08-07 NOTE — Progress Notes (Signed)
[facility] SLEEP TEST REPORT ( by Watch PAT)   STUDY DATE:  08-08-2022   ORDERING CLINICIAN: Melvyn Novas, MD  REFERRING CLINICIAN: Margie Ege, NP for Dr Terrace Arabia.   Dr Terrace Arabia is Primary neurologist, Dr Duanne Guess is PCP.   CLINICAL INFORMATION/HISTORY:   She reports she has been having issues with daytime fatigue and drowsiness for close to 10 years. She feels like she sleeps well at night, no prior SS. Also mentions she can snore so loud she wakes herself up. Dr Terrace Arabia treats for polyneuropathy , Gabapentin tid may have contributed to drowsiness,  and she changed to HS intake with little effect. She reports that she struggles not to fall asleep. Whenever she is not physically active or stimulated , she can drift easily off to sleep.  He is also aware that she snores for at least 10 years.  She does not report having being sleepier than her peers at any time in her youth or early adulthood.  She considers herself a night person and not a morning person. She is excessively daytime sleepy .Fatigue, EDS, Snoring, Fragmented Sleep, Insomnia, RLS, Neuropathy    Epworth sleepiness score: 20/24. FSS at 58/63 points.   BMI:  40 kg/m   Neck Circumference:16"    FINDINGS:   Sleep Summary:   Total Recording Time (hours, min): 7 hours 38 minutes      Total Sleep Time (hours, min):    6 hours 4 minutes             Percent REM (%):   22%   Sleep architecture was very fragmented. Sleep latency was 26 minutes, REM sleep latency 171 minutes.  There were 27 wake up.  Leading to a total of 68 minutes of wakefulness after sleep onset.                                    Respiratory Indices:   Calculated pAHI (per hour): 22.8/h                            REM pAHI:    58.2/h                                             NREM pAHI:     25.6/h                         Positional AHI: For the most part  (293 minutes )sleep was observed in supine position and  associated with an AHI of 37.7/h  The patient also slept a little over 1 hour in prone position with an AHI of 7.8/h.    Snoring shows a mean volume of 42 dB and was present for 28% of the recorded sleep time, reaching intermittently over 60 dB in volume.                                               Oxygen Saturation Statistics:     O2 Saturation Range (%):  The nadir of oxygen saturation was 78% the maximum 98% the mean saturation was 92%                                  O2 Saturation (minutes) <89%:   19 minutes, 5.2% of total sleep time      O2 Saturation ( minutes) <90%:   33 minutes, 9% of total sleep time Pulse Rate Statistics:   Pulse Mean (bpm):   71 bpm              Pulse Range:    Between 54 and 99 bpm             IMPRESSION:  This HST confirms the presence of severe sleep apnea that was more than double exacerbated during REM sleep, and was associated with clinically significant oxygen desaturation.Marland Kitchen   RECOMMENDATION: This is a supine dependent and REM sleep dependent form of sleep apnea that can likely be improved but not sufficiently controlled by avoiding supine sleep position.  I would like for this patient to start positive airway pressure therapy.  Ninth of the REM dominant form of apnea nor the associated hypoxia would respond to a dental device or to a therapy by hypoglossal nerve stimulator.  I will order an auto titrating device for this patient starting at 5 cm water pressure and offering up to 16 cm water pressure, 2 cm expiratory pressure relief, and the mask of patient's choice.    INTERPRETING PHYSICIAN:   Melvyn Novas, MD   Medical Director of [facility] at [facility].

## 2022-08-12 ENCOUNTER — Encounter (HOSPITAL_COMMUNITY): Payer: Self-pay

## 2022-08-12 ENCOUNTER — Ambulatory Visit (HOSPITAL_COMMUNITY)
Admission: RE | Admit: 2022-08-12 | Discharge: 2022-08-12 | Disposition: A | Discharge status: Home / Self Care | Payer: BC Managed Care – PPO | Source: Ambulatory Visit | Attending: Hematology and Oncology | Admitting: Hematology and Oncology

## 2022-08-12 DIAGNOSIS — Z1501 Genetic susceptibility to malignant neoplasm of breast: Secondary | ICD-10-CM | POA: Insufficient documentation

## 2022-08-12 DIAGNOSIS — Z1589 Genetic susceptibility to other disease: Secondary | ICD-10-CM | POA: Insufficient documentation

## 2022-08-12 DIAGNOSIS — Z1509 Genetic susceptibility to other malignant neoplasm: Secondary | ICD-10-CM | POA: Insufficient documentation

## 2022-08-12 MED ORDER — GADOBUTROL 1 MMOL/ML IV SOLN
10.0000 mL | Freq: Once | INTRAVENOUS | Status: AC | PRN
  Administered 2022-08-12: 10 mL via INTRAVENOUS

## 2022-08-14 ENCOUNTER — Encounter: Payer: Self-pay | Admitting: Neurology

## 2022-08-14 DIAGNOSIS — G4733 Obstructive sleep apnea (adult) (pediatric): Secondary | ICD-10-CM | POA: Insufficient documentation

## 2022-08-14 HISTORY — DX: Obstructive sleep apnea (adult) (pediatric): G47.33

## 2022-08-14 NOTE — Procedures (Signed)
[facility]. [facility] SLEEP TEST REPORT ( by Watch PAT)   STUDY DATE:  08-08-2022   ORDERING CLINICIAN: Melvyn Novas, MD  REFERRING CLINICIAN: Margie Ege, NP for Dr Terrace Arabia.   Dr Terrace Arabia is Primary neurologist, Dr Duanne Guess is PCP.   CLINICAL INFORMATION/HISTORY:   She reports she has been having issues with daytime fatigue and drowsiness for close to 10 years. She feels like she sleeps well at night, no prior SS. Also mentions she can snore so loud she wakes herself up. Dr Terrace Arabia treats for polyneuropathy , Gabapentin tid may have contributed to drowsiness,  and she changed to HS intake with little effect. She reports that she struggles not to fall asleep. Whenever she is not physically active or stimulated , she can drift easily off to sleep.  He is also aware that she snores for at least 10 years.  She does not report having being sleepier than her peers at any time in her youth or early adulthood.  She considers herself a night person and not a morning person. She is excessively daytime sleepy .Fatigue, EDS, Snoring, Fragmented Sleep, Insomnia, RLS, Neuropathy    Epworth sleepiness score: 20/24. FSS at 58/63 points.   BMI:  40 kg/m   Neck Circumference:16"    FINDINGS:   Sleep Summary:   Total Recording Time (hours, min): 7 hours 38 minutes      Total Sleep Time (hours, min):    6 hours 4 minutes             Percent REM (%):   22%   Sleep architecture was very fragmented. Sleep latency was 26 minutes, REM sleep latency 171 minutes.  There were 27 wake up.  Leading to a total of 68 minutes of wakefulness after sleep onset.                                    Respiratory Indices:   Calculated pAHI (per hour): 22.8/h                            REM pAHI:    58.2/h                                             NREM pAHI:     25.6/h                         Positional AHI: For the most part  (293 minutes )sleep was observed in supine position and associated  with an AHI of 37.7/h  The patient also slept a little over 1 hour in prone position with an AHI of 7.8/h.    Snoring shows a mean volume of 42 dB and was present for 28% of the recorded sleep time, reaching intermittently over 60 dB in volume.                                               Oxygen Saturation Statistics:     O2 Saturation Range (%):     The  nadir of oxygen saturation was 78% the maximum 98% the mean saturation was 92%                                  O2 Saturation (minutes) <89%:   19 minutes, 5.2% of total sleep time      O2 Saturation ( minutes) <90%:   33 minutes, 9% of total sleep time Pulse Rate Statistics:   Pulse Mean (bpm):   71 bpm              Pulse Range:    Between 54 and 99 bpm             IMPRESSION:  This HST confirms the presence of severe sleep apnea that was more than double exacerbated during REM sleep, and was associated with clinically significant oxygen desaturation. Please note that manifestations of restless legs or periodic limb movement disorder cannot be captured in this home sleep test.   This is a supine dependent and REM sleep dependent form of sleep apnea that can likely be improved but not sufficiently controlled by avoiding supine sleep position.  I would like for this patient to start positive airway pressure therapy.  Neither the REM dominant form of apnea nor the associated hypoxia would respond to a dental device or to a therapy by hypoglossal nerve stimulator.  RECOMMENDATION:  I will order an auto titrating device for this patient starting at 5 cm water pressure and offering up to 16 cm water pressure, 2 cm expiratory pressure relief, and the mask of patient's choice.    INTERPRETING PHYSICIAN:   Melvyn Novas, MD   Medical Director of [facility] at [facility].

## 2022-08-15 ENCOUNTER — Telehealth: Payer: Self-pay

## 2022-08-15 NOTE — Telephone Encounter (Signed)
-----   Message from Melvyn Novas, MD sent at 08/14/2022  6:17 PM EDT ----- CPAP is needed for this degree of apnea and I ordered a  CPAP  auto-titration device with humidifier, and mask fitting at DME.

## 2022-08-15 NOTE — Telephone Encounter (Signed)
I called pt. I advised pt that Dr. Vickey Huger reviewed their sleep study results and found that pt has severe osa. Dr. Vickey Huger recommends that pt start an auto-PAP at home. I reviewed PAP compliance expectations with the pt. Pt is agreeable to starting a CPAP. I advised pt that an order will be sent to a DME, [facility], and [facility] will call the pt within about one week after they file with the pt's insurance. [facility] will show the pt how to use the machine, fit for masks, and troubleshoot the CPAP if needed. An appointment will be needed with our office 31-90 days after starting auto-PAP. Pt verbalized understanding of results. Pt had no questions at this time but was encouraged to call back if questions arise. I have sent the order to [facility] and have received confirmation that they have received the order.

## 2022-08-15 NOTE — Telephone Encounter (Signed)
Called pt. Scheduled auto-PAP follow-up on 7/11 @ 9 am with Dr. Vickey Huger.

## 2022-08-19 ENCOUNTER — Ambulatory Visit
Admission: RE | Admit: 2022-08-19 | Discharge: 2022-08-19 | Disposition: A | Discharge status: Home / Self Care | Payer: BC Managed Care – PPO | Source: Ambulatory Visit | Attending: Hematology and Oncology | Admitting: Hematology and Oncology

## 2022-08-19 DIAGNOSIS — Z1509 Genetic susceptibility to other malignant neoplasm: Secondary | ICD-10-CM

## 2022-08-19 DIAGNOSIS — Z1371 Encounter for nonprocreative screening for genetic disease carrier status: Secondary | ICD-10-CM | POA: Diagnosis not present

## 2022-08-19 MED ORDER — GADOPICLENOL 0.5 MMOL/ML IV SOLN
10.0000 mL | Freq: Once | INTRAVENOUS | Status: AC | PRN
  Administered 2022-08-19: 10 mL via INTRAVENOUS

## 2022-08-27 DIAGNOSIS — I10 Essential (primary) hypertension: Secondary | ICD-10-CM | POA: Diagnosis not present

## 2022-08-27 DIAGNOSIS — J309 Allergic rhinitis, unspecified: Secondary | ICD-10-CM | POA: Diagnosis not present

## 2022-08-27 DIAGNOSIS — G4733 Obstructive sleep apnea (adult) (pediatric): Secondary | ICD-10-CM | POA: Diagnosis not present

## 2022-08-29 DIAGNOSIS — G4733 Obstructive sleep apnea (adult) (pediatric): Secondary | ICD-10-CM | POA: Diagnosis not present

## 2022-09-26 ENCOUNTER — Other Ambulatory Visit: Payer: Self-pay

## 2022-09-26 ENCOUNTER — Other Ambulatory Visit: Payer: Self-pay | Admitting: Neurology

## 2022-09-29 DIAGNOSIS — G4733 Obstructive sleep apnea (adult) (pediatric): Secondary | ICD-10-CM | POA: Diagnosis not present

## 2022-09-30 DIAGNOSIS — I10 Essential (primary) hypertension: Secondary | ICD-10-CM | POA: Diagnosis not present

## 2022-09-30 DIAGNOSIS — R609 Edema, unspecified: Secondary | ICD-10-CM | POA: Diagnosis not present

## 2022-10-02 ENCOUNTER — Telehealth: Payer: Self-pay | Admitting: Neurology

## 2022-10-02 NOTE — Telephone Encounter (Signed)
Pt is requesting a refill for gabapentin (NEURONTIN) 300 MG capsule .  Pharmacy: [facility]

## 2022-10-02 NOTE — Telephone Encounter (Signed)
I see where you mentioned in last note that this medication has the potential to contribute to her drowsiness so I wasn't sure you wanted her to continue.

## 2022-10-02 NOTE — Telephone Encounter (Signed)
Refill has been sent for the patient as previously prescribed. Maralyn Sago saw the patient last and stated meds stay current

## 2022-10-28 ENCOUNTER — Ambulatory Visit: Payer: BC Managed Care – PPO | Admitting: Neurology

## 2022-10-29 DIAGNOSIS — G4733 Obstructive sleep apnea (adult) (pediatric): Secondary | ICD-10-CM | POA: Diagnosis not present

## 2022-10-30 ENCOUNTER — Telehealth: Payer: Self-pay | Admitting: Neurology

## 2022-10-30 ENCOUNTER — Encounter: Payer: Self-pay | Admitting: Neurology

## 2022-10-30 NOTE — Telephone Encounter (Signed)
..   Pt understands that although there may be some limitations with this type of visit, we will take all precautions to reduce any security or privacy concerns.  Pt understands that this will be treated like an in office visit and we will file with pt's insurance, and there may be a patient responsible charge related to this service. ? ?

## 2022-10-31 ENCOUNTER — Telehealth (INDEPENDENT_AMBULATORY_CARE_PROVIDER_SITE_OTHER): Payer: BC Managed Care – PPO | Admitting: Neurology

## 2022-10-31 ENCOUNTER — Ambulatory Visit: Payer: BC Managed Care – PPO | Admitting: Neurology

## 2022-10-31 ENCOUNTER — Telehealth: Payer: Self-pay

## 2022-10-31 DIAGNOSIS — G4733 Obstructive sleep apnea (adult) (pediatric): Secondary | ICD-10-CM | POA: Diagnosis not present

## 2022-10-31 NOTE — Progress Notes (Signed)
Virtual Visit via Video Note  I connected with Erica Daugherty on 10/31/22 at  8:45 AM EDT by a video enabled telemedicine application and verified that I am speaking with the correct person using two identifiers.  Location: Patient: in her car  Provider: in the office    I discussed the limitations of evaluation and management by telemedicine and the availability of in person appointments. The patient expressed understanding and agreed to proceed.  History of Present Illness: 10/31/22 SS: Here for initial CPAP follow-up.  Saw Dr. Vickey Huger in March 2024 complaining of sleepiness and fatigue.  HST 08/08/2022 showed severe OSA.  CPAP compliance data 10/01/2022-10/30/22 showed usage 27/30 days at 90%, greater than 4 hours 27 days.  Average usage days used 7 hours 59 minutes. 5-16 centimeters water.  Leak 3.3, AHI 3.3. Using full face mask. There was adjustment period. Now one falls asleep, sleeping really good. Sleeps in different positions. When on her side, feels the mask will leak. Sleep is more restorative. Setup date was 08/29/22. Still feels tired first thing in the morning, but that could be due to gabapentin. She went out of town for 3 days forget her CPAP, she slept terribly, her snoring was awful, up and down all night.   Calculated pAHI (per hour): 22.8/h                            REM pAHI:    58.2/h                                             NREM pAHI:     25.6/h     06/26/22 Dr. Vickey Huger: Erica Daugherty is a 62 y.o. female patient who is seen upon referral on 06/26/2022 from dr. Terrace Arabia / Margie Ege NP for a sleep consultation.  Chief concern according to patient :  " [name] reports that about 10 years ago she began noticing more fatigue and drowsiness.  In the treatment of mixed axonal and demyelinating polyneuropathy she was also exposed to gabapentin initially 3 times daily.  This made her extra sleepy and drowsy and she changed to a bedtime intake time only.  However the improvement is  minimal.  Sitting here waiting for me she reports that she struggles not to fall asleep.  Whenever she is not physically active or stimulated she can drift easily off to sleep.  He is also aware that she snores for at least 10 years.  She does not report having being sleepier than her peers at any time in her use or early adulthood.  She considers herself a night person and not a morning person. She is excessively daytime sleepy with an EPWORTH SS of 20/ 24 points (!)> There have been no prior sleep studies.  I have the pleasure of seeing Erica Daugherty 06/26/22 a right-handed female with a possible sleep disorder   Observations/Objective: Via MyChart, in her car, is alert and oriented, speech is clear and concise, facial symmetry, follows commands, excellent historian  Assessment and Plan: 1.  OSA on CPAP  -Has very good compliance, encouraged to continue nightly use for minimum of 4 hours.  Will send an order to DME to continue current settings, send supplies as needed. -ESS scale sent via MyChart for patient to complete  Follow Up Instructions:  Follow-up appointment in February already scheduled, will address CPAP at that time   I discussed the assessment and treatment plan with the patient. The patient was provided an opportunity to ask questions and all were answered. The patient agreed with the plan and demonstrated an understanding of the instructions.   The patient was advised to call back or seek an in-person evaluation if the symptoms worsen or if the condition fails to improve as anticipated.  Otila Kluver, DNP  [facility] [address], [address] [place], [place] [address] [phone]

## 2022-10-31 NOTE — Telephone Encounter (Signed)
Order of cpap sent via community msg to [facility]

## 2022-10-31 NOTE — Patient Instructions (Signed)
Doing great on CPAP!  Keep up the good work! Continue current settings, I will send an order to your DME.  Let me know if you need anything.  I will see you in February.  Thanks!!  Happy birthday!!

## 2022-10-31 NOTE — Telephone Encounter (Signed)
-----   Message from Glean Salvo sent at 10/31/2022  8:59 AM EDT ----- Please send order to DME for CPAP.  Thanks, Maralyn Sago

## 2022-11-06 DIAGNOSIS — I10 Essential (primary) hypertension: Secondary | ICD-10-CM | POA: Diagnosis not present

## 2022-11-06 DIAGNOSIS — Z713 Dietary counseling and surveillance: Secondary | ICD-10-CM | POA: Diagnosis not present

## 2022-11-29 DIAGNOSIS — G4733 Obstructive sleep apnea (adult) (pediatric): Secondary | ICD-10-CM | POA: Diagnosis not present

## 2022-12-06 DIAGNOSIS — Z114 Encounter for screening for human immunodeficiency virus [HIV]: Secondary | ICD-10-CM | POA: Diagnosis not present

## 2022-12-06 DIAGNOSIS — Z0000 Encounter for general adult medical examination without abnormal findings: Secondary | ICD-10-CM | POA: Diagnosis not present

## 2022-12-06 DIAGNOSIS — Z13220 Encounter for screening for lipoid disorders: Secondary | ICD-10-CM | POA: Diagnosis not present

## 2022-12-06 DIAGNOSIS — E782 Mixed hyperlipidemia: Secondary | ICD-10-CM | POA: Diagnosis not present

## 2022-12-09 ENCOUNTER — Other Ambulatory Visit: Payer: Self-pay | Admitting: Neurology

## 2022-12-09 DIAGNOSIS — Z0000 Encounter for general adult medical examination without abnormal findings: Secondary | ICD-10-CM | POA: Diagnosis not present

## 2022-12-09 DIAGNOSIS — Z1501 Genetic susceptibility to malignant neoplasm of breast: Secondary | ICD-10-CM | POA: Diagnosis not present

## 2022-12-09 DIAGNOSIS — Z23 Encounter for immunization: Secondary | ICD-10-CM | POA: Diagnosis not present

## 2022-12-09 DIAGNOSIS — Z1509 Genetic susceptibility to other malignant neoplasm: Secondary | ICD-10-CM | POA: Diagnosis not present

## 2022-12-09 DIAGNOSIS — H6123 Impacted cerumen, bilateral: Secondary | ICD-10-CM | POA: Diagnosis not present

## 2022-12-10 DIAGNOSIS — I10 Essential (primary) hypertension: Secondary | ICD-10-CM | POA: Diagnosis not present

## 2022-12-12 DIAGNOSIS — G4733 Obstructive sleep apnea (adult) (pediatric): Secondary | ICD-10-CM | POA: Diagnosis not present

## 2023-01-01 DIAGNOSIS — Z1231 Encounter for screening mammogram for malignant neoplasm of breast: Secondary | ICD-10-CM | POA: Diagnosis not present

## 2023-01-10 DIAGNOSIS — I10 Essential (primary) hypertension: Secondary | ICD-10-CM | POA: Diagnosis not present

## 2023-03-25 IMAGING — DX DG CHEST 2V
2 series · 2 of 2 positions shown · non-contrast
Comparison: 03/16/2010

CLINICAL DATA: Worsening cough for 2 weeks

EXAM:
CHEST - 2 VIEW

[chest pa]
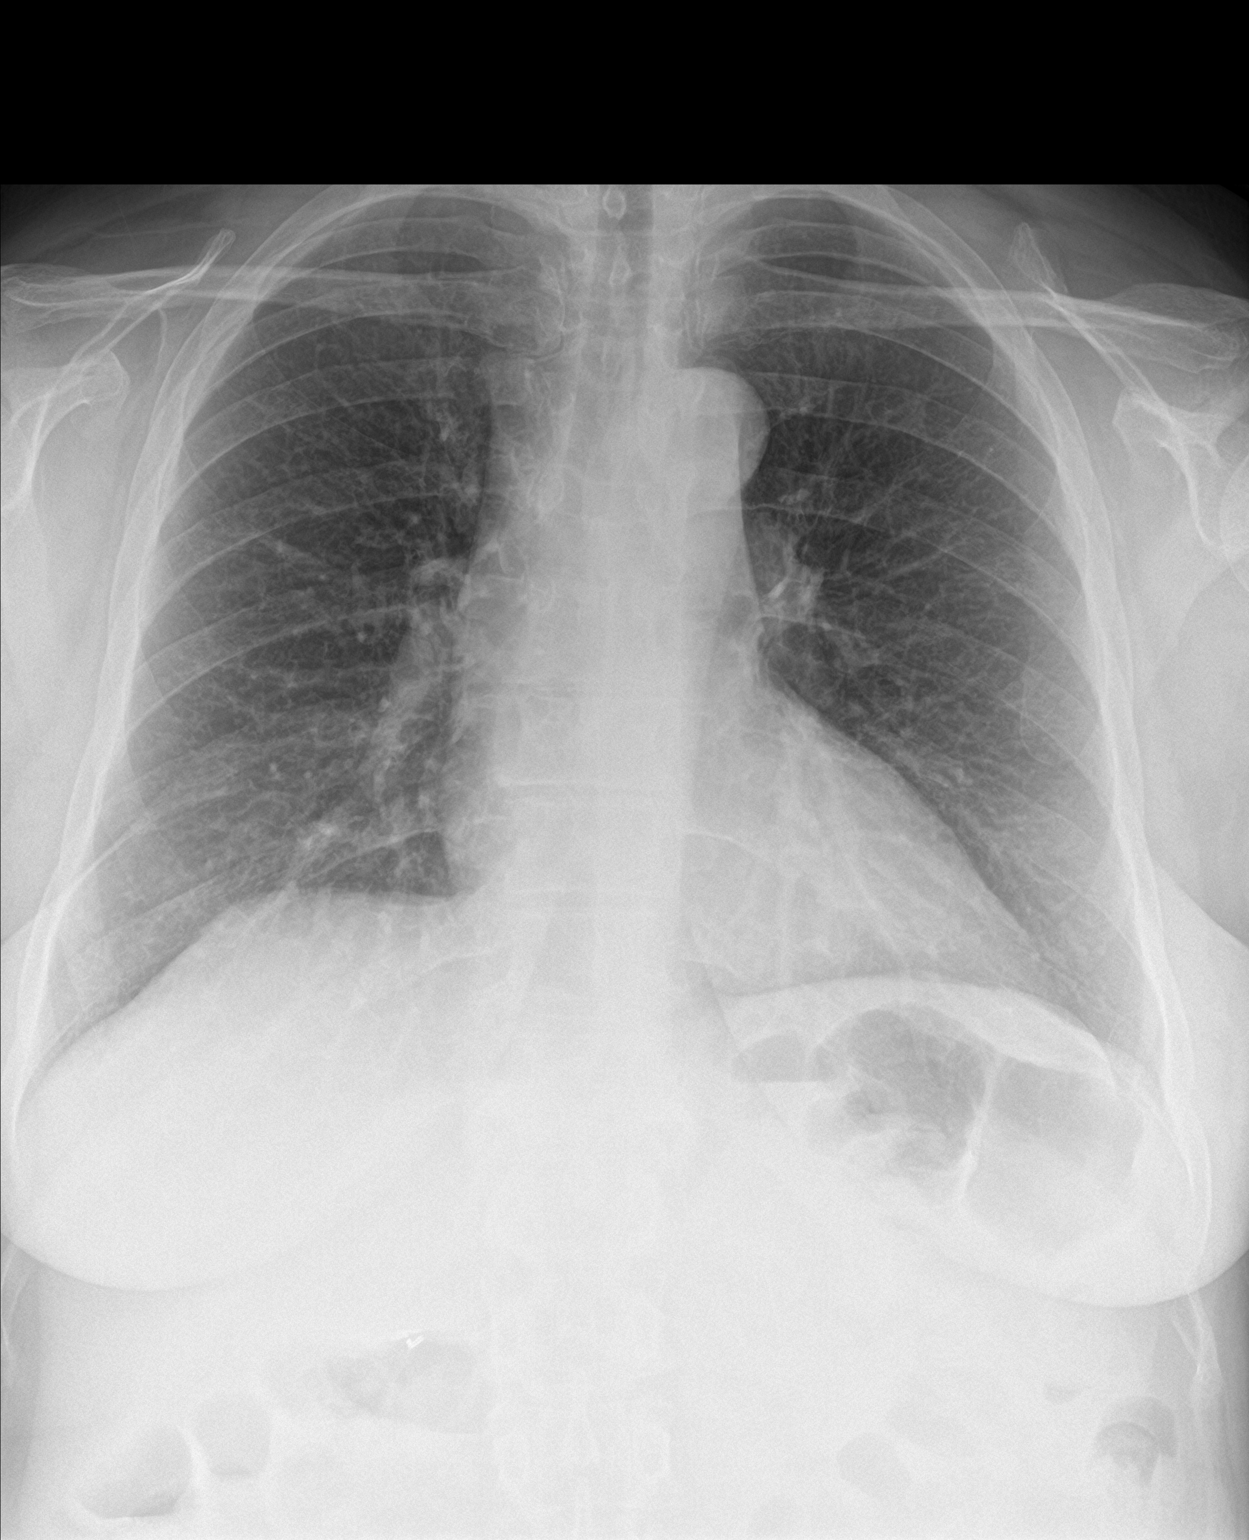

[chest lat]
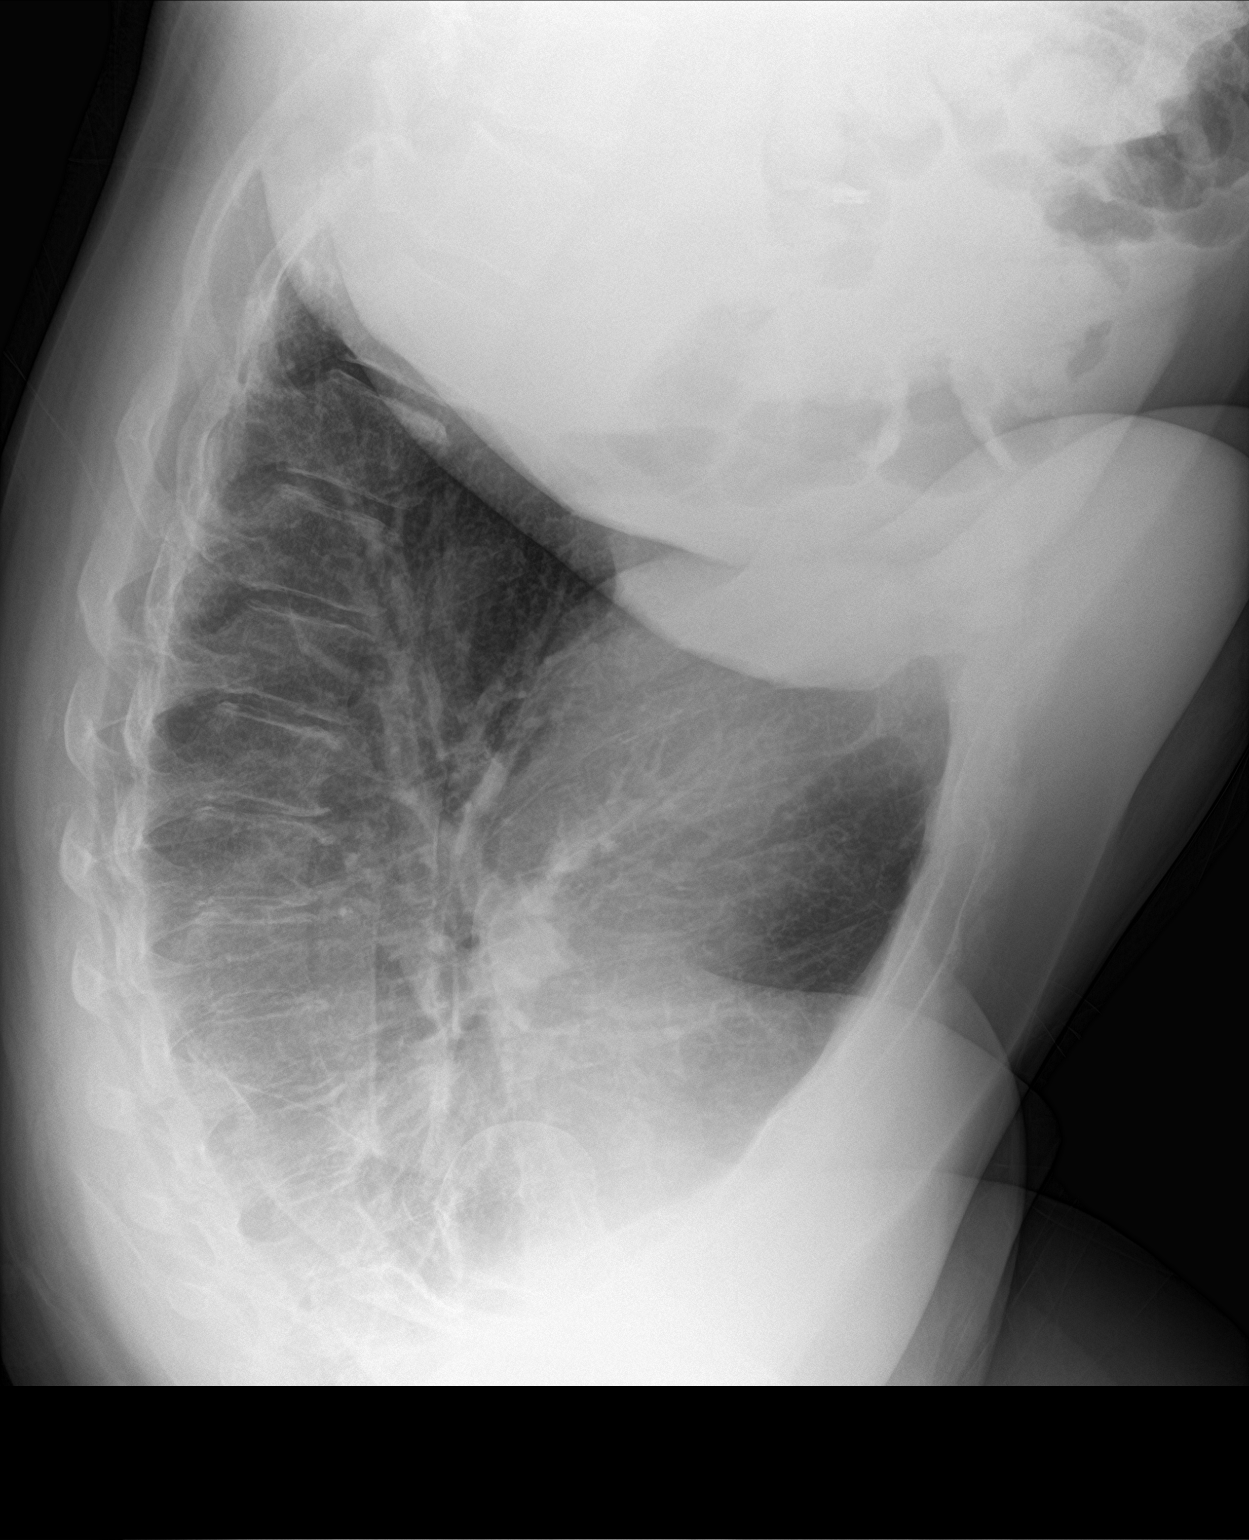

[2 of 2 positions shown; findings below may reference images not displayed]

FINDINGS: The heart size and mediastinal contours are within normal limits.
Both lungs are clear. The visualized skeletal structures are
unremarkable.
IMPRESSION: No active cardiopulmonary disease.

## 2023-03-26 DIAGNOSIS — I10 Essential (primary) hypertension: Secondary | ICD-10-CM | POA: Diagnosis not present

## 2023-03-26 DIAGNOSIS — R739 Hyperglycemia, unspecified: Secondary | ICD-10-CM | POA: Diagnosis not present

## 2023-03-31 DIAGNOSIS — E669 Obesity, unspecified: Secondary | ICD-10-CM | POA: Diagnosis not present

## 2023-03-31 DIAGNOSIS — I10 Essential (primary) hypertension: Secondary | ICD-10-CM | POA: Diagnosis not present

## 2023-03-31 DIAGNOSIS — R739 Hyperglycemia, unspecified: Secondary | ICD-10-CM | POA: Diagnosis not present

## 2023-03-31 DIAGNOSIS — G4733 Obstructive sleep apnea (adult) (pediatric): Secondary | ICD-10-CM | POA: Diagnosis not present

## 2023-04-04 DIAGNOSIS — Z23 Encounter for immunization: Secondary | ICD-10-CM | POA: Diagnosis not present

## 2023-05-01 DIAGNOSIS — H53143 Visual discomfort, bilateral: Secondary | ICD-10-CM | POA: Diagnosis not present

## 2023-05-02 DIAGNOSIS — G4733 Obstructive sleep apnea (adult) (pediatric): Secondary | ICD-10-CM | POA: Diagnosis not present

## 2023-06-06 DIAGNOSIS — E785 Hyperlipidemia, unspecified: Secondary | ICD-10-CM | POA: Diagnosis not present

## 2023-06-06 DIAGNOSIS — I10 Essential (primary) hypertension: Secondary | ICD-10-CM | POA: Diagnosis not present

## 2023-06-09 ENCOUNTER — Encounter: Payer: Self-pay | Admitting: Anesthesiology

## 2023-06-11 ENCOUNTER — Encounter: Payer: Self-pay | Admitting: Genetic Counselor

## 2023-06-11 ENCOUNTER — Ambulatory Visit: Payer: BC Managed Care – PPO | Admitting: Neurology

## 2023-06-16 ENCOUNTER — Other Ambulatory Visit: Payer: Self-pay | Admitting: Neurology

## 2023-06-19 ENCOUNTER — Telehealth: Payer: Self-pay | Admitting: Hematology and Oncology

## 2023-06-19 NOTE — Telephone Encounter (Signed)
 Called patient received no answer unable to leave patient a vm regarding upcoming appointment changes. Mailbox full

## 2023-06-25 DIAGNOSIS — G629 Polyneuropathy, unspecified: Secondary | ICD-10-CM | POA: Diagnosis not present

## 2023-06-25 DIAGNOSIS — E782 Mixed hyperlipidemia: Secondary | ICD-10-CM | POA: Diagnosis not present

## 2023-06-25 DIAGNOSIS — I10 Essential (primary) hypertension: Secondary | ICD-10-CM | POA: Diagnosis not present

## 2023-06-25 DIAGNOSIS — Z789 Other specified health status: Secondary | ICD-10-CM | POA: Diagnosis not present

## 2023-07-01 DIAGNOSIS — H18451 Nodular corneal degeneration, right eye: Secondary | ICD-10-CM | POA: Diagnosis not present

## 2023-07-07 ENCOUNTER — Encounter: Payer: Self-pay | Admitting: Anesthesiology

## 2023-07-08 ENCOUNTER — Encounter: Payer: Self-pay | Admitting: Neurology

## 2023-07-08 ENCOUNTER — Ambulatory Visit (INDEPENDENT_AMBULATORY_CARE_PROVIDER_SITE_OTHER): Payer: BC Managed Care – PPO | Admitting: Neurology

## 2023-07-08 VITALS — BP 134/93 | HR 68 | Ht 67.0 in | Wt 240.0 lb

## 2023-07-08 DIAGNOSIS — G6289 Other specified polyneuropathies: Secondary | ICD-10-CM

## 2023-07-08 DIAGNOSIS — G4733 Obstructive sleep apnea (adult) (pediatric): Secondary | ICD-10-CM | POA: Diagnosis not present

## 2023-07-08 DIAGNOSIS — G2581 Restless legs syndrome: Secondary | ICD-10-CM

## 2023-07-08 NOTE — Progress Notes (Signed)
 ASSESSMENT AND PLAN  63 y.o. year old female  1.  Peripheral neuropathy  2.  Restless leg syndrome That was confirmed by previous EMG nerve conduction study in June 2015, and repeat study May 2021, which showed evidence of mixed axonal and demyelinating neuropathy, with prolonged bilateral tibial, left ulnar F-wave latency, temporal dispersion of bilateral tibial motor responses in the setting of low CMAP amplitude.  On examination (Aug 25 2019), she is areflexia, decreased vibratory sensation to mid shin level, absent toe proprioception, decreased fingertip proprioception, positive Romberg signs, mild weakness of bilateral toe flexion, extension weakness  She has gradual onset, slow progressive symptoms since 2015,  CSF May 2021, total protein of 38,   IVIG treatment, loading dose January 03, 2020, for more than 6 treatment now, there was no significant clinical and electrodiagnostic improvement, she is also concerned about the high cost associated with IVIG treatment, we decided to stop IVIG since Feb 2023  Overall feels symptoms stable, no worsening  Continue working with primary care for gradual reduction of medications that could possibly be sedating.  She is working on lowering her dose of gabapentin, Cymbalta, Requip.  She will remain on Metanx.   3.  Severe OSA on CPAP -HST 08/08/2022 showed severe OSA  -Recommend continue CPAP nightly for minimum 4 hours.  We will continue current settings.  She has noted benefit in daytime energy since starting CPAP.  She will continue to avoid supine sleep.  She does have some residual daytime fatigue and she is working on medication adjustment with her primary care.  Will follow-up in 6 months or sooner if needed.  DIAGNOSTIC DATA (LABS, IMAGING, TESTING) - I reviewed patient records, labs, notes, testing and imaging myself where available.  HISTORY OF PRESENT ILLNESS: Erica Daugherty is a 63 years old right-handed Caucasian female, referred  by her primary care physician Dr. Marga Melnick, and pain management Dr. Alexia Freestone for evaluation of numbness in feet,  Electrodiagnostic study on October 06 2013, demonstrated a mild length dependent axonal sensory motor polyneuropathy She has past medical history of obesity but denies history of diabetes. She complains of 6 months history of bilateral feet paresthesia, at plantar surface, pad, numbness, burning sensation, needle prick,  She denies weakness, getting sore after weight bearing. She does have low back pain, going down her legs. She denies gait difficulty, no fingers paresthesia, no incontinence, Laboratory in December 2014, which has demonstrates normal CMP, CBC, TSH, LDL was elevated 152  UPDATE July 9th 2015:She is taking Gabapentin 100mg  bid, which does help her symptoms, Labs showed normal B12, folic acid, RPR, mild elevated A1c 5.7, normal electrophoresis.   UPDATE Mar 10 2018: She was seen by NP Eber Jones over past few years, she continues to complains of plantar feet swollen, was seen by vein specialist, She denies low back pain, painful to walk, she is now see urologist for urinary incontinence,   She was taking gabapentin 400 mg every night, which has been helpful, symptomatic during the day, gabapentin makes her very sleepy,  UPDATE August 05 2019: Over the years, Taelar continue complains of bilateral feet burning pain, especially late afternoon and nighttime, burning painful, difficulty sleeping, she often has to rubbing her feet together or using her hands to wrapping her lower extremity to ease up the discomfort.  She has been taking gabapentin 400 mg +100 mg 3 tablets every night with partial relief of her symptoms, she could not tolerate daytime dosage due to significant sleepiness  She denies upper extremity involvement, denies gait abnormality, has urinary urgency, but denies significant neck, low back pain.  UPDATE Aug 30 2019: Patient return for electrodiagnostic study  today, which confirmed the diagnosis of chronic neuropathy, there is evidence of mixed demyelinating and axonal features, as evident by prolonged F-wave latency at bilateral tibial, and the left ulnar motor response, there is also evidence of temporal dispersion at bilateral tibial motor responses, in the setting of decreased CMAP amplitude.  On examination, she was areflexia, decreased vibratory sensation to mid shin level, absent toe proprioception, positive Romberg signs, mild bilateral toe flexion, extension weakness  Extensive laboratory evaluations showed no treatable etiology, mild elevated A1c 5.7, normal B12, copper, vitamin D, ESR, CPK, TSH, folic acid, C-reactive protein, RPR, HIV, CBC, CMP, ANA, ferritin, protein electrophoresis,  UPDATE Sept 28 2021: She lived her first dose of IVIG on Sept 13 2021, feel bad for one day, Coreg 12 rest of the days, schedule on Feb 01 2020 for second infusion.  She was added on Cymbalta 60mg  qhs, she has trouble tolerating the medication, make her feel like hang over, even if she take it at nighttime, it does help her bilateral lower extremity deep achy pain, in addition, she takes gabapentin 400 mg 2 tablets every night for restless leg syndrome, sleeps well most of the time, but occasionally has recurrent symptoms urge to move her leg restless at the evening time  Lumbar puncture on Sep 08, 2019, total protein of 38, WBC of 0  Update Aug 21, 2020: She had received a loading dose of IVIG in September 2021, every 3 weeks, had more than 6 treatment, denies significant improvement clinically, also complains of high cost  Repeat electrodiagnostic study today showed no significant difference compared to previous study in May 2021, no continued evidence of peripheral neuropathy, axonal, likely a component of demyelinating,  She also complains of restless leg symptoms, urge to move her leg some of the nights, Requip has been helpful, taking 1 mg 2 tablets every  night, higher dose of gabapentin up to 300 mg 4 tablets every night has helped her sleep better  UPDATE Jun 05 2021: She is not taking gabapentin 1200 mg plus Cymbalta 60 mg plus Requip 2 mg before bedtime, she sleeps well, sometimes feel groggy in the morning, take her a while to come out of it  She usually goes to bed at around 11 PM, get up at 7 AM, only aft about 4 hours, by 11 AM she could feel a clear minded  She works as a Environmental manager fall, after prolonged standing, she often feels burning at the bottom of her feet, to the point of painful, she also complains of mild off-balance sensation  Update June 05, 2022 SS: Off IVIG for about a year, feels overall stable, has tried to reduce gabapentin 300 mg to 3 tablets at night. In the morning feels foggy, feels gabapentin makes her groggy, feels fine on lower dose, around 5 PM feet hurt, bottoms feel like hot needles. Balance is terrible, is very cautious. Hold the rail when holding her grandson on steps, turning the shower with eyes closed, gets off balance. Still on Cymbalta 60 mg 5 PM in the afternoon. Uses topical creams. She mentions sleep apnea concern, snoring, cotton mouth, daytime fatigue, drowsiness, would love to take a nap, struggles to get going in the morning. Also on Requip, Metanx.  ESS 17.  Update July 08, 2023 SS: Saw Dr. Vickey Huger in March 2024 complaining  of sleepiness and fatigue. HST 08/08/2022 showed severe OSA. Is completed used to CPAP now, feels less sleepy during the day, but hasn't been huge. Blames fogginess, sleepiness on gabapentin. Came off valsartan, Crestor. Is getting 8 hours of sleep. Reduced gabapentin from 300 mg, 4 capsules, down to 3, then 2. Was taking Cymbalta 60 mg PRN at 5 PM. Now Cymbalta 30 mg BID, causing headaches, waking up every 1-2 hour. In the past with legs, felt the urge to move, feet: burning. Lately has not been bothersome. 06/25/23 with PCP on Requip 0.5 mg BID. Just recently got a nasal mask,  she is trying to sleep more on her side, when using FFM, it leaks. Trying to avoid supine. On Zepbound.  Review of CPAP data AHI 3.0, leak 7.4.  30-day compliance is at 77%, but missed a few days when her husband was hospitalized.  90-day compliance is and then 90%.  PHYSICAL EXAM  PHYSICAL EXAMNIATION:  Gen: NAD, conversant, well nourised, well groomed NEUROLOGICAL EXAM:  MENTAL STATUS: Speech/Cognition: Awake, alert, normal speech, oriented to history taking and casual conversation.  CRANIAL NERVES: CN II: Visual fields are full to confrontation.  Pupils are round equal and briskly reactive to light. CN III, IV, VI: extraocular movement are normal. No ptosis. CN V: Facial sensation is intact to light touch. CN VII: Face is symmetric with normal eye closure and smile. CN VIII: Hearing is normal to casual conversation CN XI: Head turning and shoulder shrug are intact  MOTOR: Mild dorsiflexion weakness, plantar flexion bilateral, otherwise strong  REFLEXES: Areflexia  SENSORY:  decreased pinprick to ankle level (improvement from mid shin)  COORDINATION: Finger-nose-finger and heel-to-shin is normal  GAIT/STANCE: Able to stand from seated position without pushoff,   REVIEW OF SYSTEMS: Full 14 system review of systems performed and notable only for those listed, all others are neg:   See HPI   ALLERGIES: No Known Allergies  HOME MEDICATIONS: Outpatient Medications Prior to Visit  Medication Sig Dispense Refill   carvedilol (COREG) 25 MG tablet Take 25 mg by mouth 2 (two) times daily with a meal.     Cholecalciferol (VITAMIN D PO) Take 5,000 Int'l Units/day by mouth.     Cyanocobalamin 2500 MCG TABS Take 2,500 mcg by mouth daily.      DULoxetine (CYMBALTA) 60 MG capsule Take 1 capsule (60 mg total) by mouth daily. 90 capsule 4   chlorthalidone (HYGROTON) 25 MG tablet Take 12.5 mg by mouth daily. (Patient not taking: Reported on 07/08/2023)     gabapentin (NEURONTIN) 300  MG capsule TAKE FOUR CAPSULES BY MOUTH AT BEDTIME 360 capsule 1   L-Methylfolate-Algae-B12-B6 (METANX) 3-90.314-2-35 MG CAPS TAKE 1 CAPSULE BY MOUTH TWO TIMES A DAY 180 capsule 2   phentermine 15 MG capsule Take 15 mg by mouth every morning.     rOPINIRole (REQUIP) 1 MG tablet Take 2 tablets (2 mg total) by mouth at bedtime. 180 tablet 3   rosuvastatin (CRESTOR) 5 MG tablet Take 5 mg by mouth daily.     valsartan (DIOVAN) 320 MG tablet take1 tablet orally daily for High blood pressure     carvedilol (COREG) 12.5 MG tablet TAKE 1 tablet orally 2 times per day with food     No facility-administered medications prior to visit.    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   Neuropathy    Severe obstructive sleep apnea-hypopnea syndrome 08/14/2022   Umbilical hernia     PAST SURGICAL HISTORY: Past Surgical History:  Procedure Laterality Date   CHOLECYSTECTOMY N/A 12/12/2017   Procedure: LAPAROSCOPIC CHOLECYSTECTOMY WITH INTRAOPERATIVE CHOLANGIOGRAM ERAS PATHWAY;  Surgeon: Claud Kelp, MD;  Location: [facility] OR;  Service: General;  Laterality: N/A;   EYE SURGERY  04/22/1962   lazy eye   no colonoscopy     03/26/13  & 04/28/14 SOC reviewed   SHOULDER SURGERY  04/22/2010   Left ; Dr Rennis Chris   UMBILICAL HERNIA REPAIR N/A 12/12/2017   No mesh. Procedure: REPAIR UMBILICAL HERNIA;  Surgeon: Claud Kelp, MD;  Location: [facility] OR;  Service: General;  Laterality: N/A;    FAMILY HISTORY: Family History  Problem Relation Age of Onset   Diabetes Mother    Hyperlipidemia Mother    Hypertension Mother    Thyroid nodules Mother    Prostate cancer Father 51   Testicular cancer Brother 80   Diabetes Brother    Uterine  cancer Paternal Grandmother        dx unknown age   Stroke Neg Hx    Heart disease Neg Hx    Colon cancer Neg Hx    Stomach cancer Neg Hx     SOCIAL HISTORY: Social History   Socioeconomic History   Marital status: Married    Spouse name: Onalee Hua   Number of children: 4   Years of education: college   Highest education level: Not on file  Occupational History   Occupation: Educational psychologist: SELF EMPLOYED  Tobacco Use   Smoking status: Never   Smokeless tobacco: Never  Vaping Use   Vaping status: Never Used  Substance and Sexual Activity   Alcohol use: Yes    Comment: occ   Drug use: No   Sexual activity: Yes    Partners: Male    Birth control/protection: Post-menopausal, Surgical    Comment: vasectomy  Other Topics Concern   Not on file  Social History Narrative   Patient lives at home with her husband Onalee Hua). Patient works full time.   Education college   Right handed.   Caffeine sometimes one cup of sweet tea.   Social Drivers of Corporate investment banker Strain: Not on file  Food Insecurity: Not on file  Transportation Needs: Not on file  Physical Activity: Not on file  Stress: Not on file  Social Connections: Not on file  Intimate Partner Violence: Not on file   Margie Ege, Edrick Oh, DNP  [facility] [address] [place], [place] [address] [phone]

## 2023-07-08 NOTE — Patient Instructions (Signed)
 Continue to use CPAP  Continue to work with primary to titrate medications as needed

## 2023-07-14 DIAGNOSIS — D2261 Melanocytic nevi of right upper limb, including shoulder: Secondary | ICD-10-CM | POA: Diagnosis not present

## 2023-07-14 DIAGNOSIS — D2271 Melanocytic nevi of right lower limb, including hip: Secondary | ICD-10-CM | POA: Diagnosis not present

## 2023-07-14 DIAGNOSIS — D2262 Melanocytic nevi of left upper limb, including shoulder: Secondary | ICD-10-CM | POA: Diagnosis not present

## 2023-07-14 DIAGNOSIS — L814 Other melanin hyperpigmentation: Secondary | ICD-10-CM | POA: Diagnosis not present

## 2023-07-23 ENCOUNTER — Telehealth: Payer: Self-pay

## 2023-07-23 NOTE — Telephone Encounter (Signed)
 Left message on voicemail about appointment on 07/24/23

## 2023-07-23 NOTE — Progress Notes (Unsigned)
 [facility] CONSULT NOTE  Patient Care Team: Lewis Moccasin, MD as PCP - General (Family Medicine) Nilda Riggs, NP as Nurse Practitioner (Family Medicine)  CHIEF COMPLAINTS/PURPOSE OF CONSULTATION:  High risk for breast cancer  HISTORY OF PRESENTING ILLNESS:  At high risk for breast cancer  SUMMARY OF ONCOLOGIC HISTORY: Oncology History   No history exists.   This is a very pleasant 63 year old female patient with past medical history significant for angioleiomyoma's and was concerned about hereditary predisposition to cancer.  She is our Dentist had genetic Testing which showed a single pathogenic mutation in the ATM gene and VUS in SDHB gene.  Patient did not have any personal history of abnormal breast imaging requiring biopsies. 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  She had MRI breast which didn't show any evidence of malignancy  She denies any other complaints today.  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   Neuropathy    Severe obstructive sleep apnea-hypopnea syndrome 08/14/2022   Umbilical hernia     SURGICAL HISTORY: Past Surgical History:  Procedure Laterality Date   CHOLECYSTECTOMY N/A 12/12/2017   Procedure: LAPAROSCOPIC CHOLECYSTECTOMY WITH INTRAOPERATIVE CHOLANGIOGRAM ERAS PATHWAY;  Surgeon: Claud Kelp, MD;  Location: [facility] OR;  Service: General;  Laterality: N/A;   EYE SURGERY  04/22/1962   lazy eye   no colonoscopy     03/26/13  & 04/28/14 SOC reviewed   SHOULDER SURGERY  04/22/2010   Left ; Dr Rennis Chris    UMBILICAL HERNIA REPAIR N/A 12/12/2017   No mesh. Procedure: REPAIR UMBILICAL HERNIA;  Surgeon: Claud Kelp, MD;  Location: [facility] OR;  Service: General;  Laterality: N/A;    SOCIAL HISTORY: Social History   Socioeconomic History   Marital status: Married    Spouse name: Onalee Hua   Number of children: 4   Years of education: college   Highest education level: Not on file  Occupational History   Occupation: Educational psychologist: SELF EMPLOYED  Tobacco Use   Smoking status: Never   Smokeless tobacco: Never  Vaping Use   Vaping status: Never Used  Substance and Sexual Activity   Alcohol use: Yes    Comment: occ   Drug use: No   Sexual activity: Yes    Partners: Male    Birth control/protection: Post-menopausal, Surgical    Comment: vasectomy  Other Topics Concern   Not on file  Social History Narrative   Patient lives at home with her husband Onalee Hua). Patient works full time.   Education college   Right handed.   Caffeine sometimes one cup of sweet tea.   Social Drivers of Corporate investment banker Strain: Not on file  Food Insecurity: Not on file  Transportation Needs: Not on file  Physical Activity: Not on file  Stress: Not on file  Social Connections: Not on file  Intimate Partner Violence: Not on file    FAMILY HISTORY: Family History  Problem Relation Age of Onset   Diabetes Mother    Hyperlipidemia Mother    Hypertension Mother  Thyroid nodules Mother    Prostate cancer Father 83   Testicular cancer Brother 19   Diabetes Brother    Uterine cancer Paternal Grandmother        dx unknown age   Stroke Neg Hx    Heart disease Neg Hx    Colon cancer Neg Hx    Stomach cancer Neg Hx     ALLERGIES:  has no known allergies.  MEDICATIONS:  Current Outpatient Medications  Medication Sig Dispense Refill   carvedilol (COREG) 25 MG tablet Take 25 mg by mouth 2 (two) times daily with a meal.     chlorthalidone (HYGROTON) 25 MG tablet Take  12.5 mg by mouth daily. (Patient not taking: Reported on 07/08/2023)     Cholecalciferol (VITAMIN D PO) Take 5,000 Int'l Units/day by mouth.     Cyanocobalamin 2500 MCG TABS Take 2,500 mcg by mouth daily.      DULoxetine (CYMBALTA) 30 MG capsule Take 30 mg by mouth 2 (two) times daily.     gabapentin (NEURONTIN) 300 MG capsule TAKE FOUR CAPSULES BY MOUTH AT BEDTIME 360 capsule 1   L-Methylfolate-Algae-B12-B6 (METANX) 3-90.314-2-35 MG CAPS TAKE 1 CAPSULE BY MOUTH TWO TIMES A DAY 180 capsule 2   rOPINIRole (REQUIP) 1 MG tablet Take 2 tablets (2 mg total) by mouth at bedtime. 180 tablet 3   ZEPBOUND 7.5 MG/0.5ML Pen SMARTSIG:0.5 Milliliter(s) SUB-Q Once a Week     No current facility-administered medications for this visit.    REVIEW OF SYSTEMS:   Constitutional: Denies fevers, chills or abnormal night sweats Eyes: Denies blurriness of vision, double vision or watery eyes Ears, nose, mouth, throat, and face: Denies mucositis or sore throat Respiratory: Denies cough, dyspnea or wheezes Cardiovascular: Denies palpitation, chest discomfort or lower extremity swelling Gastrointestinal:  Denies nausea, heartburn or change in bowel habits Skin: Denies abnormal skin rashes Lymphatics: Denies new lymphadenopathy or easy bruising Neurological:Denies numbness, tingling or new weaknesses Behavioral/Psych: Mood is stable, no new changes  Breast: Denies any palpable lumps or discharge All other systems were reviewed with the patient and are negative.  PHYSICAL EXAMINATION: ECOG PERFORMANCE STATUS: 0 - Asymptomatic  There were no vitals filed for this visit.  There were no vitals filed for this visit.   GENERAL:alert, no distress and comfortable Neck: No cervical adenopathy BREAST: Bilateral breasts inspected.  No palpable masses or regional adenopathy Bilateral lower extremities without any edema  LABORATORY DATA:  I have reviewed the data as listed Lab Results  Component Value Date   WBC  7.3 08/05/2019   HGB 13.0 08/05/2019   HCT 38.5 08/05/2019   MCV 89 08/05/2019   PLT 237 08/05/2019   Lab Results  Component Value Date   NA 141 08/05/2019   K 3.5 08/05/2019   CL 99 08/05/2019   CO2 29 08/05/2019    RADIOGRAPHIC STUDIES: I have personally reviewed the radiological reports and agreed with the findings in the report.  ASSESSMENT AND PLAN:  This is a very pleasant 63 year old postmenopausal female patient with single pathogenic ATM mutation referred to high risk breast clinic for additional recommendations  Followup: Patient will be seen back in 1 yr for follow up. We will check her CBC as well as cmet at the time.  All questions were answered. The patient knows to call the clinic with any problems, questions or concerns.    Rachel Moulds, MD 07/23/23

## 2023-07-24 ENCOUNTER — Other Ambulatory Visit: Payer: Self-pay

## 2023-07-24 ENCOUNTER — Encounter: Payer: Self-pay | Admitting: Hematology and Oncology

## 2023-07-24 ENCOUNTER — Inpatient Hospital Stay: Payer: BC Managed Care – PPO | Attending: Hematology and Oncology | Admitting: Hematology and Oncology

## 2023-07-24 VITALS — BP 133/76 | HR 69 | Temp 97.9°F | Resp 16 | Wt 246.1 lb

## 2023-07-24 DIAGNOSIS — Z1589 Genetic susceptibility to other disease: Secondary | ICD-10-CM | POA: Diagnosis not present

## 2023-07-24 DIAGNOSIS — Z1501 Genetic susceptibility to malignant neoplasm of breast: Secondary | ICD-10-CM | POA: Diagnosis not present

## 2023-07-24 DIAGNOSIS — Z79899 Other long term (current) drug therapy: Secondary | ICD-10-CM | POA: Diagnosis not present

## 2023-07-24 DIAGNOSIS — Z7183 Encounter for nonprocreative genetic counseling: Secondary | ICD-10-CM

## 2023-07-24 DIAGNOSIS — Z1509 Genetic susceptibility to other malignant neoplasm: Secondary | ICD-10-CM | POA: Insufficient documentation

## 2023-07-24 DIAGNOSIS — G2581 Restless legs syndrome: Secondary | ICD-10-CM | POA: Diagnosis not present

## 2023-07-25 ENCOUNTER — Ambulatory Visit: Payer: BC Managed Care – PPO | Admitting: Hematology and Oncology

## 2023-07-25 ENCOUNTER — Telehealth: Payer: Self-pay | Admitting: Hematology and Oncology

## 2023-07-25 NOTE — Telephone Encounter (Signed)
 Spoke with patient confirming upcoming appointment

## 2023-07-27 ENCOUNTER — Other Ambulatory Visit: Payer: Self-pay | Admitting: Neurology

## 2023-07-30 DIAGNOSIS — J309 Allergic rhinitis, unspecified: Secondary | ICD-10-CM | POA: Diagnosis not present

## 2023-07-30 DIAGNOSIS — G629 Polyneuropathy, unspecified: Secondary | ICD-10-CM | POA: Diagnosis not present

## 2023-07-30 DIAGNOSIS — I10 Essential (primary) hypertension: Secondary | ICD-10-CM | POA: Diagnosis not present

## 2023-07-30 DIAGNOSIS — G2581 Restless legs syndrome: Secondary | ICD-10-CM | POA: Diagnosis not present

## 2023-08-20 ENCOUNTER — Other Ambulatory Visit: Payer: Self-pay | Admitting: Anesthesiology

## 2023-08-20 MED ORDER — METANX 3-90.314-2-35 MG PO CAPS: 1.0000 | ORAL_CAPSULE | Freq: Two times a day (BID) | ORAL | 2 refills | Status: DC

## 2023-08-25 ENCOUNTER — Encounter: Payer: Self-pay | Admitting: Hematology and Oncology

## 2023-08-31 ENCOUNTER — Ambulatory Visit
Admission: RE | Admit: 2023-08-31 | Discharge: 2023-08-31 | Disposition: A | Discharge status: Home / Self Care | Source: Ambulatory Visit | Attending: Hematology and Oncology

## 2023-08-31 DIAGNOSIS — Z1509 Genetic susceptibility to other malignant neoplasm: Secondary | ICD-10-CM

## 2023-08-31 MED ORDER — GADOPICLENOL 0.5 MMOL/ML IV SOLN
10.0000 mL | Freq: Once | INTRAVENOUS | Status: AC | PRN
  Administered 2023-08-31: 10 mL via INTRAVENOUS

## 2023-09-02 ENCOUNTER — Ambulatory Visit: Payer: Self-pay | Admitting: *Deleted

## 2024-03-25 ENCOUNTER — Ambulatory Visit: Admitting: Neurology

## 2024-03-25 ENCOUNTER — Encounter: Payer: Self-pay | Admitting: Neurology

## 2024-03-25 VITALS — BP 136/88 | HR 83 | Ht 67.0 in | Wt 232.2 lb

## 2024-03-25 DIAGNOSIS — G6181 Chronic inflammatory demyelinating polyneuritis: Secondary | ICD-10-CM

## 2024-03-25 DIAGNOSIS — G2581 Restless legs syndrome: Secondary | ICD-10-CM

## 2024-03-25 DIAGNOSIS — G4733 Obstructive sleep apnea (adult) (pediatric): Secondary | ICD-10-CM

## 2024-03-25 MED ORDER — METANX 3-90.314-2-35 MG PO CAPS: 1.0000 | ORAL_CAPSULE | Freq: Two times a day (BID) | ORAL | 3 refills | Status: AC

## 2024-03-25 MED ORDER — ROPINIROLE HCL 1 MG PO TABS: 2.0000 mg | ORAL_TABLET | Freq: Every day | ORAL | 3 refills | Status: AC

## 2024-03-25 NOTE — Progress Notes (Signed)
 ASSESSMENT AND PLAN  63 y.o. year old female  1.  Peripheral neuropathy  2.  Restless leg syndrome That was confirmed by previous EMG nerve conduction study in June 2015, and repeat study May 2021, which showed evidence of mixed axonal and demyelinating neuropathy, with prolonged bilateral tibial, left ulnar F-wave latency, temporal dispersion of bilateral tibial motor responses in the setting of low CMAP amplitude.  On examination (Aug 25 2019), she is areflexia, decreased vibratory sensation to mid shin level, absent toe proprioception, decreased fingertip proprioception, positive Romberg signs, mild weakness of bilateral toe flexion, extension weakness  She has gradual onset, slow progressive symptoms since 2015,  CSF May 2021, total protein of 38,   IVIG treatment, loading dose January 03, 2020, for more than 6 treatment now, there was no significant clinical and electrodiagnostic improvement, she is also concerned about the high cost associated with IVIG treatment, we decided to stop IVIG since Feb 2023  Symptoms stable, actually improved.  Able to lower dose of gabapentin  down to 300 mg nightly. Doing functional/wellness care with red light therapy.  Will continue dose of Requip , Metanx    3.  Severe OSA on CPAP -Reach out to DME to see if travel CPAP can be linked -Uses CPAP nightly either main machine or travel machine -Has had very good benefit -Working on weight loss with Zepbound -Continue nightly usage minimum 4 hours, continue current settings -Apnea is very well treated with an AHI of 0.4 -HST 08/08/2022 showed severe OSA, setup July 2024  Follow-up in 1 year or sooner if needed  DIAGNOSTIC DATA (LABS, IMAGING, TESTING) - I reviewed patient records, labs, notes, testing and imaging myself where available.  HISTORY OF PRESENT ILLNESS: Erica Daugherty is a 63 years old right-handed Caucasian female, referred by her primary care physician Dr. Elsie Roses, and pain  management Dr. Walda for evaluation of numbness in feet,  Electrodiagnostic study on October 06 2013, demonstrated a mild length dependent axonal sensory motor polyneuropathy She has past medical history of obesity but denies history of diabetes. She complains of 6 months history of bilateral feet paresthesia, at plantar surface, pad, numbness, burning sensation, needle prick,  She denies weakness, getting sore after weight bearing. She does have low back pain, going down her legs. She denies gait difficulty, no fingers paresthesia, no incontinence, Laboratory in December 2014, which has demonstrates normal CMP, CBC, TSH, LDL was elevated 152  UPDATE July 9th 2015:She is taking Gabapentin  100mg  bid, which does help her symptoms, Labs showed normal B12, folic acid, RPR, mild elevated A1c 5.7, normal electrophoresis.   UPDATE Mar 10 2018: She was seen by NP Elveria over past few years, she continues to complains of plantar feet swollen, was seen by vein specialist, She denies low back pain, painful to walk, she is now see urologist for urinary incontinence,   She was taking gabapentin  400 mg every night, which has been helpful, symptomatic during the day, gabapentin  makes her very sleepy,  UPDATE August 05 2019: Over the years, Erica Daugherty continue complains of bilateral feet burning pain, especially late afternoon and nighttime, burning painful, difficulty sleeping, she often has to rubbing her feet together or using her hands to wrapping her lower extremity to ease up the discomfort.  She has been taking gabapentin  400 mg +100 mg 3 tablets every night with partial relief of her symptoms, she could not tolerate daytime dosage due to significant sleepiness  She denies upper extremity involvement, denies gait abnormality,  has urinary urgency, but denies significant neck, low back pain.  UPDATE Aug 30 2019: Patient return for electrodiagnostic study today, which confirmed the diagnosis of chronic  neuropathy, there is evidence of mixed demyelinating and axonal features, as evident by prolonged F-wave latency at bilateral tibial, and the left ulnar motor response, there is also evidence of temporal dispersion at bilateral tibial motor responses, in the setting of decreased CMAP amplitude.  On examination, she was areflexia, decreased vibratory sensation to mid shin level, absent toe proprioception, positive Romberg signs, mild bilateral toe flexion, extension weakness  Extensive laboratory evaluations showed no treatable etiology, mild elevated A1c 5.7, normal B12, copper, vitamin D , ESR, CPK, TSH, folic acid, C-reactive protein, RPR, HIV, CBC, CMP, ANA, ferritin, protein electrophoresis,  UPDATE Sept 28 2021: She lived her first dose of IVIG on Sept 13 2021, feel bad for one day, Coreg 12 rest of the days, schedule on Feb 01 2020 for second infusion.  She was added on Cymbalta  60mg  qhs, she has trouble tolerating the medication, make her feel like hang over, even if she take it at nighttime, it does help her bilateral lower extremity deep achy pain, in addition, she takes gabapentin  400 mg 2 tablets every night for restless leg syndrome, sleeps well most of the time, but occasionally has recurrent symptoms urge to move her leg restless at the evening time  Lumbar puncture on Sep 08, 2019, total protein of 38, WBC of 0  Update Aug 21, 2020: She had received a loading dose of IVIG in September 2021, every 3 weeks, had more than 6 treatment, denies significant improvement clinically, also complains of high cost  Repeat electrodiagnostic study today showed no significant difference compared to previous study in May 2021, no continued evidence of peripheral neuropathy, axonal, likely a component of demyelinating,  She also complains of restless leg symptoms, urge to move her leg some of the nights, Requip  has been helpful, taking 1 mg 2 tablets every night, higher dose of gabapentin  up to 300 mg 4  tablets every night has helped her sleep better  UPDATE Jun 05 2021: She is not taking gabapentin  1200 mg plus Cymbalta  60 mg plus Requip  2 mg before bedtime, she sleeps well, sometimes feel groggy in the morning, take her a while to come out of it  She usually goes to bed at around 11 PM, get up at 7 AM, only aft about 4 hours, by 11 AM she could feel a clear minded  She works as a environmental manager fall, after prolonged standing, she often feels burning at the bottom of her feet, to the point of painful, she also complains of mild off-balance sensation  Update June 05, 2022 SS: Off IVIG for about a year, feels overall stable, has tried to reduce gabapentin  300 mg to 3 tablets at night. In the morning feels foggy, feels gabapentin  makes her groggy, feels fine on lower dose, around 5 PM feet hurt, bottoms feel like hot needles. Balance is terrible, is very cautious. Hold the rail when holding her grandson on steps, turning the shower with eyes closed, gets off balance. Still on Cymbalta  60 mg 5 PM in the afternoon. Uses topical creams. She mentions sleep apnea concern, snoring, cotton mouth, daytime fatigue, drowsiness, would love to take a nap, struggles to get going in the morning. Also on Requip , Metanx.  ESS 17.  Update July 08, 2023 SS: Saw Dr. Chalice in March 2024 complaining of sleepiness and fatigue. HST 08/08/2022 showed severe  OSA. Is completed used to CPAP now, feels less sleepy during the day, but hasn't been huge. Blames fogginess, sleepiness on gabapentin . Came off valsartan, Crestor. Is getting 8 hours of sleep. Reduced gabapentin  from 300 mg, 4 capsules, down to 3, then 2. Was taking Cymbalta  60 mg PRN at 5 PM. Now Cymbalta  30 mg BID, causing headaches, waking up every 1-2 hour. In the past with legs, felt the urge to move, feet: burning. Lately has not been bothersome. 06/25/23 with PCP on Requip  0.5 mg BID. Just recently got a nasal mask, she is trying to sleep more on her side, when  using FFM, it leaks. Trying to avoid supine. On Zepbound.  Review of CPAP data AHI 3.0, leak 7.4.  30-day compliance is at 77%, but missed a few days when her husband was hospitalized.    Update 03/26/24 SS: CPAP report shows 50% usage, AHI 0.4, leak 5.1. Missed a few nights recently due to helping with family. She bought a mini travel CPAP, uses this or main machine nightly. Feeling more rested, good benefit from CPAP, more energy. Working to reduce gabapentin , is down to 300 mg at bedtime, managing neuropathy/RLS fairly well. In January, started doing red light therapy to her feet, [facility]. Balance better, less pain, not missing IVIG. Remains on Requip  2 mg at bedtime, Metanx, Cymbalta  30 mg BID. Increasing dose of Zepbound. ESS 10  PHYSICAL EXAM  PHYSICAL EXAMNIATION:  Gen: NAD, conversant, well nourised, well groomed NEUROLOGICAL EXAM:  MENTAL STATUS: Speech/Cognition: Awake, alert, normal speech, oriented to history taking and casual conversation.  CRANIAL NERVES: CN II: Visual fields are full to confrontation.  Pupils are round equal and briskly reactive to light. CN III, IV, VI: extraocular movement are normal. No ptosis. CN V: Facial sensation is intact to light touch. CN VII: Face is symmetric with normal eye closure and smile. CN VIII: Hearing is normal to casual conversation CN XI: Head turning and shoulder shrug are intact  MOTOR: Mild dorsiflexion weakness, plantar flexion bilateral, otherwise strong  REFLEXES: Areflexia  SENSORY:  decreased pinprick/soft touch to mid shin level. 1-2 edema bilateral legs  COORDINATION: Finger-nose-finger and heel-to-shin is normal  GAIT/STANCE: Able to stand from seated position without pushoff.  Tandem gait is unsteady.  Difficulty with tiptoe and heel walk  REVIEW OF SYSTEMS: Full 14 system review of systems performed and notable only for those listed, all others are neg:   See HPI   ALLERGIES: No Known  Allergies  HOME MEDICATIONS: Outpatient Medications Prior to Visit  Medication Sig Dispense Refill   carvedilol (COREG) 25 MG tablet Take 25 mg by mouth 2 (two) times daily with a meal.     chlorthalidone (HYGROTON) 25 MG tablet Take 12.5 mg by mouth daily. (Patient not taking: Reported on 07/08/2023)     Cholecalciferol (VITAMIN D  PO) Take 5,000 Int'l Units/day by mouth.     Cyanocobalamin  2500 MCG TABS Take 2,500 mcg by mouth daily.      DULoxetine  (CYMBALTA ) 30 MG capsule Take 30 mg by mouth 2 (two) times daily.     gabapentin  (NEURONTIN ) 300 MG capsule TAKE FOUR CAPSULES BY MOUTH AT BEDTIME 360 capsule 0   L-Methylfolate-Algae-B12-B6 (METANX) 3-90.314-2-35 MG CAPS Take 1 capsule by mouth 2 (two) times daily. 180 capsule 2   rOPINIRole  (REQUIP ) 1 MG tablet Take 2 tablets (2 mg total) by mouth at bedtime. 180 tablet 3   ZEPBOUND 7.5 MG/0.5ML Pen SMARTSIG:0.5 Milliliter(s) SUB-Q Once a Week  No facility-administered medications prior to visit.    PAST MEDICAL HISTORY: Past Medical History:  Diagnosis Date   Cholelithiasis with chronic cholecystitis without biliary obstruction 12/12/2017   Gallstones    History of decreased platelet count    Hyperlipidemia    Hypertension    Incarcerated umbilical hernia 12/12/2017   Monoallelic mutation of ATM gene 96/94/7975   Neuropathy    Severe obstructive sleep apnea-hypopnea syndrome 08/14/2022   Umbilical hernia     PAST SURGICAL HISTORY: Past Surgical History:  Procedure Laterality Date   CHOLECYSTECTOMY N/A 12/12/2017   Procedure: LAPAROSCOPIC CHOLECYSTECTOMY WITH INTRAOPERATIVE CHOLANGIOGRAM ERAS PATHWAY;  Surgeon: Gail Favorite, MD;  Location: [facility] OR;  Service: General;  Laterality: N/A;   EYE SURGERY  04/22/1962   lazy eye   no colonoscopy     03/26/13  & 04/28/14 SOC reviewed   SHOULDER SURGERY  04/22/2010   Left ; Dr Melita   UMBILICAL HERNIA REPAIR N/A 12/12/2017   No mesh. Procedure: REPAIR UMBILICAL HERNIA;  Surgeon:  Gail Favorite, MD;  Location: [facility] OR;  Service: General;  Laterality: N/A;    FAMILY HISTORY: Family History  Problem Relation Age of Onset   Diabetes Mother    Hyperlipidemia Mother    Hypertension Mother    Thyroid  nodules Mother    Prostate cancer Father 15   Testicular cancer Brother 46   Diabetes Brother    Uterine cancer Paternal Grandmother        dx unknown age   Stroke Neg Hx    Heart disease Neg Hx    Colon cancer Neg Hx    Stomach cancer Neg Hx     SOCIAL HISTORY: Social History   Socioeconomic History   Marital status: Married    Spouse name: Erica Daugherty   Number of children: 4   Years of education: college   Highest education level: Not on file  Occupational History   Occupation: Educational Psychologist: SELF EMPLOYED  Tobacco Use   Smoking status: Never   Smokeless tobacco: Never  Vaping Use   Vaping status: Never Used  Substance and Sexual Activity   Alcohol use: Yes    Comment: occ   Drug use: No   Sexual activity: Yes    Partners: Male    Birth control/protection: Post-menopausal, Surgical    Comment: vasectomy  Other Topics Concern   Not on file  Social History Narrative   Patient lives at home with her husband Erica Daugherty). Patient works full time.   Education college   Right handed.   Caffeine sometimes one cup of sweet tea.   Social Drivers of Corporate Investment Banker Strain: Not on file  Food Insecurity: Not on file  Transportation Needs: Not on file  Physical Activity: Not on file  Stress: Not on file  Social Connections: Not on file  Intimate Partner Violence: Not on file   Erica Daugherty, SCHARLENE, DNP  [facility] [address], [address] [place], [place] [address] [phone]

## 2024-03-25 NOTE — Patient Instructions (Signed)
 Great to see you today.  Reach out to DME to see if they can link your travel machine.  Otherwise continue nightly usage minimum 4 hours.  We will continue current settings.  Continue current medication doses for neuropathy.  Exercise, stay active.  Follow-up 1 year.  Thanks!!

## 2024-04-06 ENCOUNTER — Telehealth: Payer: Self-pay | Admitting: Hematology and Oncology

## 2024-04-06 NOTE — Telephone Encounter (Signed)
 I left a voicemail regarding patients 07/26/24 appt being rescheduled to 07/27/24.

## 2024-05-03 ENCOUNTER — Encounter: Payer: Self-pay | Admitting: *Deleted

## 2024-07-26 ENCOUNTER — Ambulatory Visit: Admitting: Hematology and Oncology

## 2024-07-27 ENCOUNTER — Inpatient Hospital Stay: Payer: Self-pay | Admitting: Hematology and Oncology

## 2025-03-29 ENCOUNTER — Ambulatory Visit: Admitting: Neurology
